# Patient Record
Sex: Female | Born: 1950
Health system: Southern US, Community
[De-identification: ages and names within clinical notes are randomized; demographics above are authoritative.]

## PROBLEM LIST (undated history)

## (undated) DIAGNOSIS — I255 Ischemic cardiomyopathy: Secondary | ICD-10-CM

## (undated) DIAGNOSIS — E119 Type 2 diabetes mellitus without complications: Secondary | ICD-10-CM

## (undated) DIAGNOSIS — I2102 ST elevation (STEMI) myocardial infarction involving left anterior descending coronary artery: Secondary | ICD-10-CM

## (undated) DIAGNOSIS — I472 Ventricular tachycardia, unspecified: Secondary | ICD-10-CM

## (undated) DIAGNOSIS — E785 Hyperlipidemia, unspecified: Secondary | ICD-10-CM

## (undated) DIAGNOSIS — M1990 Unspecified osteoarthritis, unspecified site: Secondary | ICD-10-CM

## (undated) DIAGNOSIS — I10 Essential (primary) hypertension: Secondary | ICD-10-CM

## (undated) DIAGNOSIS — F32A Depression, unspecified: Secondary | ICD-10-CM

## (undated) DIAGNOSIS — T7840XA Allergy, unspecified, initial encounter: Secondary | ICD-10-CM

## (undated) DIAGNOSIS — I5020 Unspecified systolic (congestive) heart failure: Secondary | ICD-10-CM

## (undated) DIAGNOSIS — I480 Paroxysmal atrial fibrillation: Secondary | ICD-10-CM

## (undated) DIAGNOSIS — I2510 Atherosclerotic heart disease of native coronary artery without angina pectoris: Secondary | ICD-10-CM

## (undated) HISTORY — DX: Unspecified systolic (congestive) heart failure: I50.20

## (undated) HISTORY — DX: Atherosclerotic heart disease of native coronary artery without angina pectoris: I25.10

## (undated) HISTORY — DX: Depression, unspecified: F32.A

## (undated) HISTORY — PX: HERNIA REPAIR: SHX51

## (undated) HISTORY — DX: Allergy, unspecified, initial encounter: T78.40XA

## (undated) HISTORY — PX: APPENDECTOMY: SHX54

## (undated) HISTORY — DX: Unspecified osteoarthritis, unspecified site: M19.90

## (undated) HISTORY — PX: CHOLECYSTECTOMY OPEN: SUR202

## (undated) HISTORY — DX: ST elevation (STEMI) myocardial infarction involving left anterior descending coronary artery: I21.02

## (undated) HISTORY — DX: Ischemic cardiomyopathy: I25.5

## (undated) HISTORY — PX: CARDIAC CATHETERIZATION: SHX172

---

## 2012-05-22 ENCOUNTER — Encounter (HOSPITAL_COMMUNITY): Payer: Self-pay | Admitting: *Deleted

## 2012-05-22 ENCOUNTER — Emergency Department (HOSPITAL_COMMUNITY): Payer: Self-pay

## 2012-05-22 DIAGNOSIS — I10 Essential (primary) hypertension: Secondary | ICD-10-CM | POA: Insufficient documentation

## 2012-05-22 DIAGNOSIS — R079 Chest pain, unspecified: Principal | ICD-10-CM | POA: Insufficient documentation

## 2012-05-22 DIAGNOSIS — R61 Generalized hyperhidrosis: Secondary | ICD-10-CM | POA: Insufficient documentation

## 2012-05-22 LAB — CBC
MCH: 28.9 pg (ref 26.0–34.0)
MCHC: 34.7 g/dL (ref 30.0–36.0)
MCV: 83.5 fL (ref 78.0–100.0)
Platelets: 251 10*3/uL (ref 150–400)
RBC: 5.15 MIL/uL — ABNORMAL HIGH (ref 3.87–5.11)
RDW: 13.7 % (ref 11.5–15.5)

## 2012-05-22 LAB — COMPREHENSIVE METABOLIC PANEL
AST: 38 U/L — ABNORMAL HIGH (ref 0–37)
CO2: 26 mEq/L (ref 19–32)
Calcium: 9.4 mg/dL (ref 8.4–10.5)
Creatinine, Ser: 0.74 mg/dL (ref 0.50–1.10)
GFR calc non Af Amer: 90 mL/min — ABNORMAL LOW (ref >90)

## 2012-05-22 LAB — CK TOTAL AND CKMB (NOT AT ARMC): Total CK: 79 U/L (ref 7–177)

## 2012-05-22 LAB — POCT I-STAT TROPONIN I

## 2012-05-22 NOTE — ED Notes (Signed)
Pt reports pain started as sharp pains to back intermittent, then began today with left side rib pain and chest pain. Reports diaphoresis, denies sob. Pt very anxious at triage.

## 2012-05-23 ENCOUNTER — Observation Stay (HOSPITAL_COMMUNITY)
Admission: EM | Admit: 2012-05-23 | Discharge: 2012-05-23 | Disposition: A | Discharge status: Home / Self Care | Payer: Self-pay | Attending: Emergency Medicine | Admitting: Emergency Medicine

## 2012-05-23 DIAGNOSIS — R079 Chest pain, unspecified: Secondary | ICD-10-CM

## 2012-05-23 DIAGNOSIS — R072 Precordial pain: Secondary | ICD-10-CM

## 2012-05-23 HISTORY — DX: Essential (primary) hypertension: I10

## 2012-05-23 LAB — POCT I-STAT TROPONIN I: Troponin i, poc: 0 ng/mL (ref 0.00–0.08)

## 2012-05-23 MED ORDER — ASPIRIN 81 MG PO CHEW
324.0000 mg | CHEWABLE_TABLET | Freq: Once | ORAL | Status: AC
  Administered 2012-05-23: 324 mg via ORAL
  Filled 2012-05-23: qty 4

## 2012-05-23 NOTE — ED Notes (Signed)
Regular Diet Ordered 

## 2012-05-23 NOTE — Progress Notes (Signed)
  Echocardiogram Echocardiogram Stress Test has been performed.  Lindsey Pierce 05/23/2012, 11:10 AM

## 2012-05-23 NOTE — ED Provider Notes (Signed)
12:48 PM Pt care is assumed in the CDU, where she is on the chest pain protocol. Stress echo has been performed, pending result. No chest pain since arrival to ED, resting comfortably on stretcher. Lungs CTAB. Heart RRR. Abd s/nt/nd. Speech clear and appropriate. Extremities without edema.  Pt updated on delay. Will continue to monitor.   1:30 PM Per cardiology reading as reviewed in [facility], normal stress echo at maximum intensity. Results discussed with pt, who will be d/c home at this time. She will f/u with her PCP regarding today's ED visit.  Shaaron Adler, PA-C 05/23/12 1330

## 2012-05-23 NOTE — ED Notes (Signed)
Pt. Reports having "a twinge, like eletricity flowing in between my ribs on the left side and  Shooting across left pectoralx 1 day". No pain on rooming to ED. Denies any previous episode. Denies cardiac hxt. Reports having increased stress over the past month. (Death of mother and brother). Familial cardiac hxt, unspecified. Denies SOB. Denies N/V. Denies numbness and tingling. Denies  Headache. Not currently on any medication. A.O. X4. NAD.

## 2012-05-23 NOTE — ED Notes (Signed)
Patient is resting comfortably. 

## 2012-05-23 NOTE — ED Provider Notes (Addendum)
History     CSN: [id number]  Arrival date & time 05/22/12  0865   First MD Initiated Contact with Patient 05/23/12 0023      Chief Complaint  Patient presents with  . Chest Pain    (Consider location/radiation/quality/duration/timing/severity/associated sxs/prior treatment) Patient is a 61 y.o. female presenting with chest pain. The history is provided by the patient.  Chest Pain The chest pain began 2 days ago. Duration of episode(s) is 5 seconds. Chest pain occurs intermittently. The chest pain is worsening. Associated with: nothing. At its most intense, the pain is at 5/10. The pain is currently at 0/10. The severity of the pain is mild. The quality of the pain is described as aching and sharp. Radiates to: started in the left lower chest and felt it shoot up to the left clavicle. Exacerbated by: nothing. Pertinent negatives for primary symptoms include no syncope, no shortness of breath, no cough, no wheezing, no abdominal pain, no nausea, no vomiting and no dizziness.  Associated symptoms include diaphoresis. She tried nothing for the symptoms.  Her past medical history is significant for hyperlipidemia and hypertension.  Pertinent negatives for past medical history include no MI and no PE.  Pertinent negatives for family medical history include: no CAD in family and no early MI in family.  Procedure history is negative for cardiac catheterization and echocardiogram.     Past Medical History  Diagnosis Date  . Hypertension     History reviewed. No pertinent past surgical history.  History reviewed. No pertinent family history.  History  Substance Use Topics  . Smoking status: Not on file  . Smokeless tobacco: Not on file  . Alcohol Use: No    OB History    Grav Para Term Preterm Abortions TAB SAB Ect Mult Living                  Review of Systems  Constitutional: Positive for diaphoresis.  Respiratory: Negative for cough, shortness of breath and wheezing.     Cardiovascular: Positive for chest pain. Negative for syncope.  Gastrointestinal: Negative for nausea, vomiting and abdominal pain.  Neurological: Negative for dizziness.  All other systems reviewed and are negative.    Allergies  Codeine and Darvon  Home Medications   Current Outpatient Rx  Name Route Sig Dispense Refill  . ADULT MULTIVITAMIN W/MINERALS CH Oral Take 1 tablet by mouth daily.      BP 165/102  Pulse 92  Temp 98.3 F (36.8 C) (Oral)  Resp 20  SpO2 98%  Physical Exam  Nursing note and vitals reviewed. Constitutional: She is oriented to person, place, and time. She appears well-developed and well-nourished. No distress.       Tearful on exam.  States she is scared  HENT:  Head: Normocephalic and atraumatic.  Mouth/Throat: Oropharynx is clear and moist.  Eyes: Conjunctivae and EOM are normal. Pupils are equal, round, and reactive to light.  Neck: Normal range of motion. Neck supple.  Cardiovascular: Normal rate, regular rhythm and intact distal pulses.   No murmur heard. Pulmonary/Chest: Effort normal and breath sounds normal. No respiratory distress. She has no wheezes. She has no rales.  Abdominal: Soft. She exhibits no distension. There is no tenderness. There is no rebound and no guarding.  Musculoskeletal: Normal range of motion. She exhibits no edema and no tenderness.  Neurological: She is alert and oriented to person, place, and time.  Skin: Skin is warm and dry. No rash noted. No erythema.  Psychiatric: She has a normal mood and affect. Her behavior is normal.    ED Course  Procedures (including critical care time)  Labs Reviewed  CBC - Abnormal; Notable for the following:    RBC 5.15 (*)     All other components within normal limits  COMPREHENSIVE METABOLIC PANEL - Abnormal; Notable for the following:    Glucose, Bld 107 (*)     AST 38 (*)     ALT 51 (*)     GFR calc non Af Amer 90 (*)     All other components within normal limits  CK  TOTAL AND CKMB  POCT I-STAT TROPONIN I   Dg Chest 2 View  05/22/2012  *RADIOLOGY REPORT*  Clinical Data: Left-sided chest pain for 2 days.  CHEST - 2 VIEW  Comparison: None.  Findings: There is borderline cardiomegaly.  Pulmonary vascularity is normal and the lungs are clear.  No pneumothorax.  No pleural effusions.  No acute osseous abnormality.  IMPRESSION: Borderline cardiomegaly.  Otherwise no significant abnormality.  Original Report Authenticated By: Gwynn Burly, M.D.    Date: 05/23/2012  Rate: 90  Rhythm: normal sinus rhythm  QRS Axis: left  Intervals: normal  ST/T Wave abnormalities: normal  Conduction Disutrbances:none  Narrative Interpretation:   Old EKG Reviewed: none available    No diagnosis found.    MDM   Patient with atypical chest pain that starts in the left side of her chest that's been intermittent for the last 2 days and today and admitted to the lower clavicle and seconds. She denies any cough but did have diaphoresis. For shortness of breath or infectious symptoms.  Appearing here and pain-free. Patient takes no medications but has a history of hypertension and hyperlipidemia. She states these improved when she lost 85 pounds. No family history of heart disease. This makes her a TIMI 1 for recurrent episodes in the last 24 hours. Patient's initial troponin was 0.07 the rest of her labs were within normal limits. EKG with some mild left axis deviation otherwise within normal limits and her chest x-ray showed borderline cardiomegaly. On exam patient is asymptomatic at this time. Repeat troponin ordered this would make it 12 hours after her first episode of pain today. If this troponin is normal place her in the chest pain obs protocol.  12:57 AM Repeat troponin was neg.  Will place in Chest pain obs.        Gwyneth Sprout, MD 05/23/12 0040  Gwyneth Sprout, MD 05/23/12 [id number]  Gwyneth Sprout, MD 05/23/12 1308

## 2012-05-24 NOTE — ED Provider Notes (Signed)
Medical screening examination/treatment/procedure(s) were conducted as a shared visit with non-physician practitioner(s) and myself.  I personally evaluated the patient during the encounter   Gwyneth Sprout, MD 05/24/12 [phone]

## 2018-05-31 ENCOUNTER — Inpatient Hospital Stay (HOSPITAL_COMMUNITY)
Admission: EM | Admit: 2018-05-31 | Discharge: 2018-06-06 | DRG: 270 | Disposition: A | Discharge status: Home / Self Care | Payer: Medicare Other | Attending: Internal Medicine | Admitting: Internal Medicine

## 2018-05-31 ENCOUNTER — Emergency Department (HOSPITAL_COMMUNITY): Admit: 2018-05-31 | Payer: Self-pay | Admitting: Internal Medicine

## 2018-05-31 ENCOUNTER — Inpatient Hospital Stay (HOSPITAL_COMMUNITY): Payer: Medicare Other

## 2018-05-31 ENCOUNTER — Inpatient Hospital Stay (HOSPITAL_COMMUNITY)
Admission: EM | Disposition: A | Discharge status: Home / Self Care | Payer: Self-pay | Source: Home / Self Care | Attending: Internal Medicine

## 2018-05-31 DIAGNOSIS — I213 ST elevation (STEMI) myocardial infarction of unspecified site: Secondary | ICD-10-CM | POA: Diagnosis not present

## 2018-05-31 DIAGNOSIS — E876 Hypokalemia: Secondary | ICD-10-CM | POA: Diagnosis not present

## 2018-05-31 DIAGNOSIS — R570 Cardiogenic shock: Secondary | ICD-10-CM

## 2018-05-31 DIAGNOSIS — I5021 Acute systolic (congestive) heart failure: Secondary | ICD-10-CM | POA: Diagnosis not present

## 2018-05-31 DIAGNOSIS — R197 Diarrhea, unspecified: Secondary | ICD-10-CM | POA: Diagnosis not present

## 2018-05-31 DIAGNOSIS — Z885 Allergy status to narcotic agent status: Secondary | ICD-10-CM

## 2018-05-31 DIAGNOSIS — I472 Ventricular tachycardia: Secondary | ICD-10-CM | POA: Diagnosis not present

## 2018-05-31 DIAGNOSIS — Z95811 Presence of heart assist device: Secondary | ICD-10-CM | POA: Diagnosis not present

## 2018-05-31 DIAGNOSIS — I255 Ischemic cardiomyopathy: Secondary | ICD-10-CM | POA: Diagnosis present

## 2018-05-31 DIAGNOSIS — E785 Hyperlipidemia, unspecified: Secondary | ICD-10-CM

## 2018-05-31 DIAGNOSIS — I10 Essential (primary) hypertension: Secondary | ICD-10-CM

## 2018-05-31 DIAGNOSIS — I4891 Unspecified atrial fibrillation: Secondary | ICD-10-CM | POA: Diagnosis present

## 2018-05-31 DIAGNOSIS — R112 Nausea with vomiting, unspecified: Secondary | ICD-10-CM | POA: Diagnosis not present

## 2018-05-31 DIAGNOSIS — I340 Nonrheumatic mitral (valve) insufficiency: Secondary | ICD-10-CM | POA: Diagnosis not present

## 2018-05-31 DIAGNOSIS — I110 Hypertensive heart disease with heart failure: Secondary | ICD-10-CM | POA: Diagnosis not present

## 2018-05-31 DIAGNOSIS — I2109 ST elevation (STEMI) myocardial infarction involving other coronary artery of anterior wall: Secondary | ICD-10-CM | POA: Diagnosis not present

## 2018-05-31 DIAGNOSIS — R0902 Hypoxemia: Secondary | ICD-10-CM | POA: Diagnosis not present

## 2018-05-31 DIAGNOSIS — Z6841 Body Mass Index (BMI) 40.0 and over, adult: Secondary | ICD-10-CM | POA: Diagnosis not present

## 2018-05-31 DIAGNOSIS — I2102 ST elevation (STEMI) myocardial infarction involving left anterior descending coronary artery: Secondary | ICD-10-CM | POA: Diagnosis present

## 2018-05-31 DIAGNOSIS — Z9889 Other specified postprocedural states: Secondary | ICD-10-CM

## 2018-05-31 DIAGNOSIS — I2510 Atherosclerotic heart disease of native coronary artery without angina pectoris: Secondary | ICD-10-CM | POA: Diagnosis not present

## 2018-05-31 DIAGNOSIS — Z955 Presence of coronary angioplasty implant and graft: Secondary | ICD-10-CM

## 2018-05-31 DIAGNOSIS — R0789 Other chest pain: Secondary | ICD-10-CM | POA: Diagnosis not present

## 2018-05-31 DIAGNOSIS — R079 Chest pain, unspecified: Secondary | ICD-10-CM | POA: Diagnosis not present

## 2018-05-31 HISTORY — DX: Hyperlipidemia, unspecified: E78.5

## 2018-05-31 HISTORY — PX: LEFT HEART CATH AND CORONARY ANGIOGRAPHY: CATH118249

## 2018-05-31 HISTORY — DX: Paroxysmal atrial fibrillation: I48.0

## 2018-05-31 HISTORY — PX: CORONARY/GRAFT ACUTE MI REVASCULARIZATION: CATH118305

## 2018-05-31 HISTORY — DX: Type 2 diabetes mellitus without complications: E11.9

## 2018-05-31 HISTORY — DX: Ventricular tachycardia, unspecified: I47.20

## 2018-05-31 HISTORY — DX: Ventricular tachycardia: I47.2

## 2018-05-31 HISTORY — PX: IABP INSERTION: CATH118242

## 2018-05-31 LAB — COMPREHENSIVE METABOLIC PANEL
ALBUMIN: 3.9 g/dL (ref 3.5–5.0)
ALT: 38 U/L (ref 0–44)
ANION GAP: 11 (ref 5–15)
AST: 34 U/L (ref 15–41)
Alkaline Phosphatase: 69 U/L (ref 38–126)
BUN: 17 mg/dL (ref 8–23)
CHLORIDE: 106 mmol/L (ref 98–111)
CO2: 23 mmol/L (ref 22–32)
Calcium: 8.7 mg/dL — ABNORMAL LOW (ref 8.9–10.3)
Creatinine, Ser: 0.99 mg/dL (ref 0.44–1.00)
GFR calc Af Amer: >60 mL/min (ref >60)
GFR calc non Af Amer: 58 mL/min — ABNORMAL LOW (ref >60)
GLUCOSE: 234 mg/dL — AB (ref 70–99)
POTASSIUM: 3.5 mmol/L (ref 3.5–5.1)
Sodium: 140 mmol/L (ref 135–145)
Total Bilirubin: 0.6 mg/dL (ref 0.3–1.2)
Total Protein: 6.8 g/dL (ref 6.5–8.1)

## 2018-05-31 LAB — CBC
HEMATOCRIT: 41.8 % (ref 36.0–46.0)
Hemoglobin: 14.1 g/dL (ref 12.0–15.0)
MCH: 28.8 pg (ref 26.0–34.0)
MCHC: 33.7 g/dL (ref 30.0–36.0)
MCV: 85.5 fL (ref 78.0–100.0)
Platelets: 269 10*3/uL (ref 150–400)
RBC: 4.89 MIL/uL (ref 3.87–5.11)
RDW: 13.2 % (ref 11.5–15.5)
WBC: 7.2 10*3/uL (ref 4.0–10.5)

## 2018-05-31 LAB — PROTIME-INR
INR: 1.2
Prothrombin Time: 15.1 seconds (ref 11.4–15.2)

## 2018-05-31 LAB — HEMOGLOBIN A1C
Hgb A1c MFr Bld: 7.3 % — ABNORMAL HIGH (ref 4.8–5.6)
MEAN PLASMA GLUCOSE: 162.81 mg/dL

## 2018-05-31 LAB — APTT: APTT: 28 s (ref 24–36)

## 2018-05-31 SURGERY — CORONARY/GRAFT ACUTE MI REVASCULARIZATION
Anesthesia: LOCAL

## 2018-05-31 MED ORDER — HEPARIN (PORCINE) IN NACL 1000-0.9 UT/500ML-% IV SOLN
INTRAVENOUS | Status: AC
  Filled 2018-05-31: qty 1000

## 2018-05-31 MED ORDER — ASPIRIN 81 MG PO CHEW
81.0000 mg | CHEWABLE_TABLET | Freq: Every day | ORAL | Status: DC
  Administered 2018-06-01 – 2018-06-06 (×6): 81 mg via ORAL
  Filled 2018-05-31 (×6): qty 1

## 2018-05-31 MED ORDER — DEXTROSE 5 % IV SOLN
INTRAVENOUS | Status: DC | PRN
  Administered 2018-05-31: 5 ug/min via INTRAVENOUS

## 2018-05-31 MED ORDER — HEPARIN (PORCINE) IN NACL 1000-0.9 UT/500ML-% IV SOLN
INTRAVENOUS | Status: DC | PRN
  Administered 2018-05-31 (×3): 500 mL

## 2018-05-31 MED ORDER — AMIODARONE HCL IN DEXTROSE 360-4.14 MG/200ML-% IV SOLN
INTRAVENOUS | Status: DC | PRN
  Administered 2018-05-31: 60 mg/h via INTRAVENOUS

## 2018-05-31 MED ORDER — NITROGLYCERIN 1 MG/10 ML FOR IR/CATH LAB
INTRA_ARTERIAL | Status: AC
  Filled 2018-05-31: qty 10

## 2018-05-31 MED ORDER — AMIODARONE LOAD VIA INFUSION
INTRAVENOUS | Status: DC | PRN
  Administered 2018-05-31: 150 mg via INTRAVENOUS

## 2018-05-31 MED ORDER — SODIUM CHLORIDE 0.9% FLUSH: 3.0000 mL | INTRAVENOUS | Status: DC | PRN

## 2018-05-31 MED ORDER — AMIODARONE HCL 150 MG/3ML IV SOLN
INTRAVENOUS | Status: AC
  Filled 2018-05-31: qty 3

## 2018-05-31 MED ORDER — HEPARIN SODIUM (PORCINE) 1000 UNIT/ML IJ SOLN
INTRAMUSCULAR | Status: AC
  Filled 2018-05-31: qty 1

## 2018-05-31 MED ORDER — INSULIN ASPART 100 UNIT/ML ~~LOC~~ SOLN
0.0000 [IU] | Freq: Three times a day (TID) | SUBCUTANEOUS | Status: DC
  Administered 2018-06-01 – 2018-06-02 (×5): 3 [IU] via SUBCUTANEOUS
  Administered 2018-06-02 – 2018-06-03 (×2): 2 [IU] via SUBCUTANEOUS
  Administered 2018-06-03: 3 [IU] via SUBCUTANEOUS
  Administered 2018-06-04 (×2): 2 [IU] via SUBCUTANEOUS
  Administered 2018-06-05: 3 [IU] via SUBCUTANEOUS
  Administered 2018-06-05 – 2018-06-06 (×2): 2 [IU] via SUBCUTANEOUS

## 2018-05-31 MED ORDER — FENTANYL CITRATE (PF) 100 MCG/2ML IJ SOLN
INTRAMUSCULAR | Status: DC | PRN
  Administered 2018-05-31: 25 ug via INTRAVENOUS

## 2018-05-31 MED ORDER — MIDAZOLAM HCL 2 MG/2ML IJ SOLN
INTRAMUSCULAR | Status: AC
  Filled 2018-05-31: qty 2

## 2018-05-31 MED ORDER — NOREPINEPHRINE 4 MG/250ML-% IV SOLN
0.0000 ug/min | INTRAVENOUS | Status: DC
Start: 2018-05-31 — End: 2018-06-04
  Administered 2018-06-01: 5 ug/min via INTRAVENOUS

## 2018-05-31 MED ORDER — FENTANYL CITRATE (PF) 100 MCG/2ML IJ SOLN
INTRAMUSCULAR | Status: AC
  Filled 2018-05-31: qty 2

## 2018-05-31 MED ORDER — DEXTROSE 5 % IV SOLN
INTRAVENOUS | Status: AC | PRN
  Administered 2018-05-31: 5 ug/min via INTRAVENOUS

## 2018-05-31 MED ORDER — SODIUM CHLORIDE 0.9 % IV SOLN: 250.0000 mL | INTRAVENOUS | Status: DC | PRN

## 2018-05-31 MED ORDER — HEPARIN (PORCINE) IN NACL 1000-0.9 UT/500ML-% IV SOLN
INTRAVENOUS | Status: AC
  Filled 2018-05-31: qty 500

## 2018-05-31 MED ORDER — SODIUM CHLORIDE 0.9% FLUSH
3.0000 mL | Freq: Two times a day (BID) | INTRAVENOUS | Status: DC
  Administered 2018-06-01: 3 mL via INTRAVENOUS
  Administered 2018-06-01: 10 mL via INTRAVENOUS
  Administered 2018-06-02 – 2018-06-06 (×9): 3 mL via INTRAVENOUS

## 2018-05-31 MED ORDER — SODIUM CHLORIDE 0.9 % IV SOLN
INTRAVENOUS | Status: AC | PRN
  Administered 2018-05-31: 4 ug/kg/min via INTRAVENOUS

## 2018-05-31 MED ORDER — AMIODARONE HCL IN DEXTROSE 360-4.14 MG/200ML-% IV SOLN
60.0000 mg/h | INTRAVENOUS | Status: AC
  Administered 2018-06-01: 60 mg/h via INTRAVENOUS
  Filled 2018-05-31: qty 200

## 2018-05-31 MED ORDER — ONDANSETRON HCL 4 MG/2ML IJ SOLN
4.0000 mg | Freq: Four times a day (QID) | INTRAMUSCULAR | Status: DC | PRN
  Administered 2018-06-05: 4 mg via INTRAVENOUS
  Filled 2018-05-31 (×2): qty 2

## 2018-05-31 MED ORDER — LABETALOL HCL 5 MG/ML IV SOLN: 10.0000 mg | INTRAVENOUS | Status: AC | PRN

## 2018-05-31 MED ORDER — CANGRELOR TETRASODIUM 50 MG IV SOLR
INTRAVENOUS | Status: AC
  Filled 2018-05-31: qty 50

## 2018-05-31 MED ORDER — AMIODARONE HCL IN DEXTROSE 360-4.14 MG/200ML-% IV SOLN
INTRAVENOUS | Status: AC
  Filled 2018-05-31: qty 200

## 2018-05-31 MED ORDER — TICAGRELOR 90 MG PO TABS
90.0000 mg | ORAL_TABLET | Freq: Two times a day (BID) | ORAL | Status: DC
  Administered 2018-06-01 – 2018-06-06 (×11): 90 mg via ORAL
  Filled 2018-05-31 (×11): qty 1

## 2018-05-31 MED ORDER — HEPARIN SODIUM (PORCINE) 1000 UNIT/ML IJ SOLN
INTRAMUSCULAR | Status: DC | PRN
  Administered 2018-05-31: 5000 [IU] via INTRAVENOUS

## 2018-05-31 MED ORDER — VERAPAMIL HCL 2.5 MG/ML IV SOLN
INTRAVENOUS | Status: DC | PRN
  Administered 2018-05-31: 10 mL via INTRA_ARTERIAL

## 2018-05-31 MED ORDER — MIDAZOLAM HCL 2 MG/2ML IJ SOLN
INTRAMUSCULAR | Status: DC | PRN
  Administered 2018-05-31: 1 mg via INTRAVENOUS

## 2018-05-31 MED ORDER — LIDOCAINE HCL (PF) 1 % IJ SOLN
INTRAMUSCULAR | Status: AC
  Filled 2018-05-31: qty 30

## 2018-05-31 MED ORDER — AMIODARONE HCL IN DEXTROSE 360-4.14 MG/200ML-% IV SOLN
30.0000 mg/h | INTRAVENOUS | Status: DC
  Administered 2018-06-01 (×3): 30 mg/h via INTRAVENOUS
  Filled 2018-05-31 (×2): qty 200

## 2018-05-31 MED ORDER — HEPARIN SODIUM (PORCINE) 1000 UNIT/ML IJ SOLN
INTRAMUSCULAR | Status: DC | PRN
  Administered 2018-05-31: 8000 [IU] via INTRAVENOUS
  Administered 2018-05-31: 3000 [IU] via INTRAVENOUS

## 2018-05-31 MED ORDER — SODIUM CHLORIDE 0.9 % IV SOLN
4.0000 ug/kg/min | INTRAVENOUS | Status: AC
  Administered 2018-06-01: 4 ug/kg/min via INTRAVENOUS
  Filled 2018-05-31: qty 50

## 2018-05-31 MED ORDER — VERAPAMIL HCL 2.5 MG/ML IV SOLN
INTRAVENOUS | Status: AC
  Filled 2018-05-31: qty 2

## 2018-05-31 MED ORDER — SODIUM CHLORIDE 0.9 % IV SOLN
INTRAVENOUS | Status: DC | PRN
  Administered 2018-05-31: 999 mL/h via INTRAVENOUS

## 2018-05-31 MED ORDER — FUROSEMIDE 10 MG/ML IJ SOLN
40.0000 mg | Freq: Once | INTRAMUSCULAR | Status: AC
  Administered 2018-06-01: 40 mg via INTRAVENOUS
  Filled 2018-05-31: qty 4

## 2018-05-31 MED ORDER — ACETAMINOPHEN 325 MG PO TABS
650.0000 mg | ORAL_TABLET | ORAL | Status: DC | PRN
  Administered 2018-06-01 – 2018-06-02 (×3): 650 mg via ORAL
  Filled 2018-05-31 (×4): qty 2

## 2018-05-31 MED ORDER — TICAGRELOR 90 MG PO TABS
ORAL_TABLET | ORAL | Status: DC | PRN
  Administered 2018-05-31: 180 mg via ORAL

## 2018-05-31 MED ORDER — CANGRELOR BOLUS VIA INFUSION
INTRAVENOUS | Status: DC | PRN
  Administered 2018-05-31: 3240 ug via INTRAVENOUS

## 2018-05-31 MED ORDER — LIDOCAINE HCL (PF) 1 % IJ SOLN
INTRAMUSCULAR | Status: DC | PRN
  Administered 2018-05-31: 2 mL

## 2018-05-31 MED ORDER — IOPAMIDOL (ISOVUE-370) INJECTION 76%
INTRAVENOUS | Status: AC
  Filled 2018-05-31: qty 125

## 2018-05-31 MED ORDER — HYDRALAZINE HCL 20 MG/ML IJ SOLN
5.0000 mg | INTRAMUSCULAR | Status: AC | PRN
  Filled 2018-05-31: qty 1

## 2018-05-31 MED ORDER — TICAGRELOR 90 MG PO TABS
ORAL_TABLET | ORAL | Status: AC
  Filled 2018-05-31: qty 2

## 2018-05-31 MED ORDER — INSULIN ASPART 100 UNIT/ML ~~LOC~~ SOLN
0.0000 [IU] | Freq: Every day | SUBCUTANEOUS | Status: DC
  Administered 2018-06-01: 3 [IU] via SUBCUTANEOUS

## 2018-05-31 MED ORDER — POTASSIUM CHLORIDE CRYS ER 20 MEQ PO TBCR
40.0000 meq | EXTENDED_RELEASE_TABLET | Freq: Once | ORAL | Status: AC
  Administered 2018-06-01: 40 meq via ORAL
  Filled 2018-05-31: qty 2

## 2018-05-31 MED ORDER — NOREPINEPHRINE 4 MG/250ML-% IV SOLN
INTRAVENOUS | Status: AC
  Filled 2018-05-31: qty 250

## 2018-05-31 SURGICAL SUPPLY — 26 items
BALLN IABP SENSA PLUS 7.5F 40C (BALLOONS) ×4
BALLN SAPPHIRE 2.0X15 (BALLOONS) ×2
BALLN SAPPHIRE ~~LOC~~ 2.75X12 (BALLOONS) ×2 IMPLANT
BALLOON IABP SENS PLUS 7.5F40C (BALLOONS) ×2 IMPLANT
BALLOON SAPPHIRE 2.0X15 (BALLOONS) ×1 IMPLANT
CATH 5FR JL3.5 JR4 ANG PIG MP (CATHETERS) ×2 IMPLANT
CATH LAUNCHER 6FR EBU3.5 (CATHETERS) ×2 IMPLANT
DEVICE RAD COMP TR BAND LRG (VASCULAR PRODUCTS) ×2 IMPLANT
GLIDESHEATH SLEND A-KIT 6F 22G (SHEATH) ×2 IMPLANT
GUIDEWIRE INQWIRE 1.5J.035X260 (WIRE) ×2 IMPLANT
INQWIRE 1.5J .035X260CM (WIRE) ×4
KIT ENCORE 26 ADVANTAGE (KITS) ×2 IMPLANT
KIT HEART LEFT (KITS) ×2 IMPLANT
KIT MICROPUNCTURE NIT STIFF (SHEATH) ×4 IMPLANT
PACK CARDIAC CATHETERIZATION (CUSTOM PROCEDURE TRAY) ×2 IMPLANT
PAD ELECT DEFIB RADIOL ZOLL (MISCELLANEOUS) ×2 IMPLANT
SHEATH PINNACLE 6F 10CM (SHEATH) ×4 IMPLANT
SHEATH PROBE COVER 6X72 (BAG) ×6 IMPLANT
STENT SYNERGY DES 2.75X16 (Permanent Stent) ×2 IMPLANT
TRANSDUCER W/STOPCOCK (MISCELLANEOUS) ×2 IMPLANT
TUBING CIL FLEX 10 FLL-RA (TUBING) ×2 IMPLANT
WIRE AMPLATZ ST .035X145CM (WIRE) ×2 IMPLANT
WIRE EMERALD 3MM-J .035X150CM (WIRE) ×2 IMPLANT
WIRE HI TORQ BMW 190CM (WIRE) ×2 IMPLANT
WIRE HI TORQ VERSACORE-J 145CM (WIRE) ×2 IMPLANT
WIRE RUNTHROUGH .014X180CM (WIRE) ×2 IMPLANT

## 2018-05-31 NOTE — Progress Notes (Signed)
Transported patient from cath lab to room 2H24 while on bipap. Transitioned patient to high flow cannula at 15lpm. Will continue to monitor patient.

## 2018-05-31 NOTE — Progress Notes (Signed)
Patient placed on bipap due to a decrease in SP02 level while on 100% NRB. Sp02 increased to 94-97% with bipap.

## 2018-05-31 NOTE — H&P (Signed)
Cardiology Admission History and Physical:   Patient ID: Lindsey Pierce; MRN: [id number]; DOB: 1951-07-11   Admission date: 05/31/2018   Chief Complaint:  Chest pain (Anterolateral STEMI, Cardiogenic shock, AFIB with RVR)  History of Present Illness:   Lindsey Pierce is a 67 y.o. female with a history of hypertension and hyperlipidemia presents with complaints of substernal chest pain that started 45 minutes prior to presentation to the hospital. She was apparently walking in a grocery store when she had the pain. There was associated diaphoresis and lightheadedness. EMS found her to be in mild distress. She was treated with ASA 324 mg and 1 dose of NTG. HR was in the 160's. Suspicious for atrial fibrillation with RVR and ST elevations (anterolateral leads). Systolic blood pressure was in the 80s.  The STEMI team was activated and she was taken urgently to the cardiac catheterization lab for STEMI and cardiogenic shock.    Past Medical History:  Diagnosis Date  . Hypertension   Hyperlipidemia  No past surgical history on file.   Medications Prior to Admission: Prior to Admission medications   Medication Sig Start Date End Date Taking? Authorizing Provider  Multiple Vitamin (MULTIVITAMIN WITH MINERALS) TABS Take 1 tablet by mouth daily.    [provider]     Allergies:    Allergies  Allergen Reactions  . Codeine     swelling  . Darvon [Propoxyphene Hcl]     swelling    Social History:   Social History   Socioeconomic History  . Marital status: Divorced    Spouse name: Not on file  . Number of children: Not on file  . Years of education: Not on file  . Highest education level: Not on file  Occupational History  . Not on file  Social Needs  . Financial resource strain: Not on file  . Food insecurity:    Worry: Not on file    Inability: Not on file  . Transportation needs:    Medical: Not on file    Non-medical: Not on file  Tobacco Use  . Smoking status:  Not on file  Substance and Sexual Activity  . Alcohol use: No  . Drug use: No  . Sexual activity: Not on file  Lifestyle  . Physical activity:    Days per week: Not on file    Minutes per session: Not on file  . Stress: Not on file  Relationships  . Social connections:    Talks on phone: Not on file    Gets together: Not on file    Attends religious service: Not on file    Active member of club or organization: Not on file    Attends meetings of clubs or organizations: Not on file    Relationship status: Not on file  . Intimate partner violence:    Fear of current or ex partner: Not on file    Emotionally abused: Not on file    Physically abused: Not on file    Forced sexual activity: Not on file  Other Topics Concern  . Not on file  Social History Narrative  . Not on file    Family History:  The patient's family history is not on file.    Review of Systems: [y] = yes, [ ]  = no   . General: Weight gain [ ] ; Weight loss [ ] ; Anorexia [ ] ; Fatigue [ ] ; Fever [ ] ; Chills [ ] ; Weakness [ ]   . Cardiac: Chest pain/pressure Blue.Reese ];  Resting SOB [ ] ; Exertional SOB [ ] ; Orthopnea [ ] ; Pedal Edema [ ] ; Palpitations [ ] ; Syncope [ ] ; Presyncope [ ] ; Paroxysmal nocturnal dyspnea[ ]   . Pulmonary: Cough [ ] ; Wheezing[ ] ; Hemoptysis[ ] ; Sputum [ ] ; Snoring [ ]   . GI: Vomiting[ ] ; Dysphagia[ ] ; Melena[ ] ; Hematochezia [ ] ; Heartburn[ ] ; Abdominal pain [ ] ; Constipation [ ] ; Diarrhea [ ] ; BRBPR [ ]   . GU: Hematuria[ ] ; Dysuria [ ] ; Nocturia[ ]   . Vascular: Pain in legs with walking [ ] ; Pain in feet with lying flat [ ] ; Non-healing sores [ ] ; Stroke [ ] ; TIA [ ] ; Slurred speech [ ] ;  . Neuro: Headaches[ ] ; Vertigo[ ] ; Seizures[ ] ; Paresthesias[ ] ;Blurred vision [ ] ; Diplopia [ ] ; Vision changes [ ]   . Ortho/Skin: Arthritis [ ] ; Joint pain [ ] ; Muscle pain [ ] ; Joint swelling [ ] ; Back Pain [ ] ; Rash [ ]   . Psych: Depression[ ] ; Anxiety[ ]   . Heme: Bleeding problems [ ] ; Clotting disorders [ ] ;  Anemia [ ]   . Endocrine: Diabetes [ ] ; Thyroid dysfunction[ ]     Physical Exam/Data:  There were no vitals filed for this visit. No intake or output data in the 24 hours ending 05/31/18 2100 There were no vitals filed for this visit. There is no height or weight on file to calculate BMI.  General:  Well nourished, well developed, diaphoretic HEENT: normal Lymph: no adenopathy Neck: unable to assess Endocrine:  No thryomegaly Vascular: thready pulses Cardiac:  Unable to assess Lungs: Unable to assess Abd: soft, nontender, no hepatomegaly  Ext: unable to assess Musculoskeletal:  No deformities,  Skin: diaphoretic Neuro:  Unable to assess    EKG:  ST elevations in the anterolateral leads and atrial fibrillation with RVR.    Laboratory Data:  ChemistryNo results for input(s): NA, K, CL, CO2, GLUCOSE, BUN, CREATININE, CALCIUM, GFRNONAA, GFRAA, ANIONGAP in the last 168 hours.  No results for input(s): PROT, ALBUMIN, AST, ALT, ALKPHOS, BILITOT in the last 168 hours. HematologyNo results for input(s): WBC, RBC, HGB, HCT, MCV, MCH, MCHC, RDW, PLT in the last 168 hours. Cardiac EnzymesNo results for input(s): TROPONINI in the last 168 hours. No results for input(s): TROPIPOC in the last 168 hours.  BNPNo results for input(s): BNP, PROBNP in the last 168 hours.  DDimer No results for input(s): DDIMER in the last 168 hours.  Radiology/Studies:  No results found.  Assessment and Plan:   1. ST Elevation myocardial infarction  Evidence of ST elevations (anterolateral) on her EMS ECG tracings. Treated with ASA 324mg  in the field. Also given 1 does of NTG.  - Transport to cardiac catheterization lab for consideration for diagnostic angiography and primary PCI - Continue ASA - Transthoracic echocardiogram to evaluate LV function - Check lipid panel. High dose statins - Continue to trend cardiac biomarkers with serial ECGs  2. Cardiogenic shock  Likely ischemic in etiology and  exacerbated with her tachyarrhythmia  - Consider LV support (with Impella/ECMO/IABP) - Pressors as needed   3. Atrial fibrillation with rapid ventricular rate  - Rate control with AV nodal blockers. - Once coronary anatomy is defined then consider rhythm management (TEE/Cardioversion) - Consider IV unfractionated heparin for now   For questions or updates, please contact [facility] Please consult [email or web address] for contact info under Cardiology/STEMI.    Signed, Meade Maw, MD  05/31/2018 8:49 PM

## 2018-06-01 ENCOUNTER — Inpatient Hospital Stay (HOSPITAL_COMMUNITY): Payer: Medicare Other

## 2018-06-01 ENCOUNTER — Encounter (HOSPITAL_COMMUNITY): Payer: Self-pay | Admitting: *Deleted

## 2018-06-01 DIAGNOSIS — I340 Nonrheumatic mitral (valve) insufficiency: Secondary | ICD-10-CM

## 2018-06-01 LAB — GLUCOSE, CAPILLARY
GLUCOSE-CAPILLARY: 185 mg/dL — AB (ref 70–99)
GLUCOSE-CAPILLARY: 255 mg/dL — AB (ref 70–99)
Glucose-Capillary: 190 mg/dL — ABNORMAL HIGH (ref 70–99)
Glucose-Capillary: 189 mg/dL — ABNORMAL HIGH (ref 70–99)
Glucose-Capillary: 181 mg/dL — ABNORMAL HIGH (ref 70–99)
Glucose-Capillary: 136 mg/dL — ABNORMAL HIGH (ref 70–99)

## 2018-06-01 LAB — CBC
HCT: 43.6 % (ref 36.0–46.0)
Hemoglobin: 14.5 g/dL (ref 12.0–15.0)
MCH: 28.8 pg (ref 26.0–34.0)
MCHC: 33.3 g/dL (ref 30.0–36.0)
MCV: 86.7 fL (ref 78.0–100.0)
Platelets: 220 10*3/uL (ref 150–400)
RBC: 5.03 MIL/uL (ref 3.87–5.11)
RDW: 13.5 % (ref 11.5–15.5)
WBC: 11.2 10*3/uL — ABNORMAL HIGH (ref 4.0–10.5)

## 2018-06-01 LAB — TROPONIN I
TROPONIN I: 0.23 ng/mL — AB (ref <0.03)
Troponin I: >65 ng/mL (ref <0.03)
Troponin I: >65 ng/mL (ref <0.03)
Troponin I: 56.85 ng/mL (ref <0.03)

## 2018-06-01 LAB — MRSA PCR SCREENING: MRSA by PCR: NEGATIVE

## 2018-06-01 LAB — LIPID PANEL
Cholesterol: 198 mg/dL (ref 0–200)
HDL: 30 mg/dL — ABNORMAL LOW (ref >40)
LDL Cholesterol: 134 mg/dL — ABNORMAL HIGH (ref 0–99)
Total CHOL/HDL Ratio: 6.6 RATIO
Triglycerides: 170 mg/dL — ABNORMAL HIGH (ref <150)
VLDL: 34 mg/dL (ref 0–40)

## 2018-06-01 LAB — BASIC METABOLIC PANEL
ANION GAP: 11 (ref 5–15)
BUN: 16 mg/dL (ref 8–23)
CALCIUM: 8.5 mg/dL — AB (ref 8.9–10.3)
CO2: 26 mmol/L (ref 22–32)
Chloride: 106 mmol/L (ref 98–111)
Creatinine, Ser: 0.97 mg/dL (ref 0.44–1.00)
GFR calc Af Amer: >60 mL/min (ref >60)
GFR, EST NON AFRICAN AMERICAN: 59 mL/min — AB (ref >60)
GLUCOSE: 195 mg/dL — AB (ref 70–99)
Potassium: 3.7 mmol/L (ref 3.5–5.1)
Sodium: 143 mmol/L (ref 135–145)

## 2018-06-01 LAB — ECHOCARDIOGRAM COMPLETE
HEIGHTINCHES: 61 in
WEIGHTICAEL: 3954.1706 [oz_av]

## 2018-06-01 LAB — POCT ACTIVATED CLOTTING TIME: ACTIVATED CLOTTING TIME: 120 s

## 2018-06-01 LAB — MAGNESIUM: Magnesium: 1.9 mg/dL (ref 1.7–2.4)

## 2018-06-01 MED ORDER — SALINE SPRAY 0.65 % NA SOLN
1.0000 | NASAL | Status: DC | PRN
  Administered 2018-06-01: 1 via NASAL
  Filled 2018-06-01: qty 44

## 2018-06-01 MED ORDER — MELATONIN 3 MG PO TABS
6.0000 mg | ORAL_TABLET | Freq: Every day | ORAL | Status: DC
  Administered 2018-06-01: 6 mg via ORAL
  Filled 2018-06-01 (×5): qty 2

## 2018-06-01 MED ORDER — FENTANYL CITRATE (PF) 100 MCG/2ML IJ SOLN
25.0000 ug | Freq: Once | INTRAMUSCULAR | Status: AC
  Administered 2018-06-01: 25 ug via INTRAVENOUS
  Filled 2018-06-01: qty 2

## 2018-06-01 MED ORDER — ORAL CARE MOUTH RINSE
15.0000 mL | Freq: Two times a day (BID) | OROMUCOSAL | Status: DC
  Administered 2018-06-02: 15 mL via OROMUCOSAL

## 2018-06-01 MED ORDER — FUROSEMIDE 10 MG/ML IJ SOLN
INTRAMUSCULAR | Status: AC
  Filled 2018-06-01: qty 4

## 2018-06-01 MED ORDER — MELATONIN 3 MG PO TABS
3.0000 mg | ORAL_TABLET | Freq: Every day | ORAL | Status: DC
  Filled 2018-06-01: qty 1

## 2018-06-01 MED ORDER — HEPARIN (PORCINE) IN NACL 100-0.45 UNIT/ML-% IJ SOLN
850.0000 [IU]/h | INTRAMUSCULAR | Status: DC
  Administered 2018-06-01: 850 [IU]/h via INTRAVENOUS
  Filled 2018-06-01: qty 250

## 2018-06-01 MED ORDER — ATORVASTATIN CALCIUM 80 MG PO TABS
80.0000 mg | ORAL_TABLET | Freq: Every day | ORAL | Status: DC
  Administered 2018-06-01 – 2018-06-05 (×5): 80 mg via ORAL
  Filled 2018-06-01 (×5): qty 1

## 2018-06-01 NOTE — Progress Notes (Signed)
ANTICOAGULATION CONSULT NOTE - Initial Consult  Pharmacy Consult for Heparin  Indication: IABP, s/p cath  Allergies  Allergen Reactions  . Codeine     swelling  . Darvon [Propoxyphene Hcl]     swelling   Vital Signs: Temp: 98.8 F (37.1 C) (07/20 0354) Temp Source: Oral (07/20 0354) BP: 129/72 (07/20 0300) Pulse Rate: 126 (07/19 2254)  Labs: Recent Labs    05/31/18 2108  HGB 14.1  HCT 41.8  PLT 269  APTT 28  LABPROT 15.1  INR 1.20  CREATININE 0.99  TROPONINI 0.23*    Estimated Creatinine Clearance: 62.6 mL/min (by C-G formula based on SCr of 0.99 mg/dL).   Medical History: Past Medical History:  Diagnosis Date  . Hypertension    Assessment: 67 y/o F s/p cath with successful PCI to the mid LAD/D2, pt with cardiogenic shock requiring placement of IABP, starting heparin 2 hours post-TR removal which was done at 0230. Baseline renal function and CBC good.   Goal of Therapy:  Heparin level 0.2-0.5 units/ml Monitor platelets by anticoagulation protocol: Yes   Plan:  Start heparin at 850 units/hr at 0430 1130 HL Daily CB/HL Monitor for bleeding  Narda Bonds 06/01/2018,4:02 AM

## 2018-06-01 NOTE — Progress Notes (Signed)
IABP removed by Cath lab personnel Harriet.  Patient given 25 mcg fentanyl for pain at site. Site is level 1 with bruising. DP pulse palpable.  Cap refill <3.  Patient is resting comfortably.  6 HOUR BEDREST STARTS AT 1500 AND WILL BE UP AT 2100.     Per protocol foley will be removed at the time bedrest is up and patient can begin to mobilize. RN will continue to monitor patient.   Bo Merino, RN

## 2018-06-01 NOTE — Progress Notes (Signed)
Per cardiology MD at bedside wean IABP 1:2 for now. RN will continue to monitor closely.

## 2018-06-01 NOTE — Progress Notes (Addendum)
Progress Note  Patient Name: Lindsey Pierce Date of Encounter: 06/01/2018  Primary Cardiologist: No primary care provider on file.   Subjective   "I feel better today", denies chest pain or shortness of breath.  Inpatient Medications    Scheduled Meds: . aspirin  81 mg Oral Daily  . atorvastatin  80 mg Oral q1800  . insulin aspart  0-15 Units Subcutaneous TID WC  . insulin aspart  0-5 Units Subcutaneous QHS  . mouth rinse  15 mL Mouth Rinse BID  . sodium chloride flush  3 mL Intravenous Q12H  . ticagrelor  90 mg Oral BID   Continuous Infusions: . sodium chloride 10 mL/hr at 06/01/18 1000  . amiodarone 30 mg/hr (06/01/18 1000)  . heparin 850 Units/hr (06/01/18 1000)  . norepinephrine (LEVOPHED) Adult infusion Stopped (06/01/18 0014)   PRN Meds: sodium chloride, acetaminophen, ondansetron (ZOFRAN) IV, sodium chloride flush   Vital Signs    Vitals:   06/01/18 0800 06/01/18 0823 06/01/18 0900 06/01/18 1000  BP: (!) 130/102     Pulse:      Resp: 19  (!) 21 17  Temp:  98.3 F (36.8 C)    TempSrc:  Oral    SpO2: 97%  97% 98%  Weight:      Height:        Intake/Output Summary (Last 24 hours) at 06/01/2018 1018 Last data filed at 06/01/2018 1000 Gross per 24 hour  Intake 442.28 ml  Output 1150 ml  Net -707.72 ml   Filed Weights   05/31/18 2345  Weight: 247 lb 2.2 oz (112.1 kg)    Telemetry    Normal sinus rhythm- Personally Reviewed  ECG    Normal sinus rhythm with inferior and anterior MI pattern- Personally Reviewed  Physical Exam   GEN: No acute distress.   Neck:  6 cm JVD Cardiac: RRR, no murmurs, rubs, or gallops.  Respiratory: Clear to auscultation bilaterally. GI: Soft, obese, nontender, non-distended  MS: No edema; No deformity.  Aortic balloon pump remains in place Neuro:  Nonfocal  Psych: Normal affect   Labs    Chemistry Recent Labs  Lab 05/31/18 2108 06/01/18 0402  NA 140 143  K 3.5 3.7  CL 106 106  CO2 23 26  GLUCOSE 234* 195*   BUN 17 16  CREATININE 0.99 0.97  CALCIUM 8.7* 8.5*  PROT 6.8  --   ALBUMIN 3.9  --   AST 34  --   ALT 38  --   ALKPHOS 69  --   BILITOT 0.6  --   GFRNONAA 58* 59*  GFRAA >60 >60  ANIONGAP 11 11     Hematology Recent Labs  Lab 05/31/18 2108 06/01/18 0402  WBC 7.2 11.2*  RBC 4.89 5.03  HGB 14.1 14.5  HCT 41.8 43.6  MCV 85.5 86.7  MCH 28.8 28.8  MCHC 33.7 33.3  RDW 13.2 13.5  PLT 269 220    Cardiac Enzymes Recent Labs  Lab 05/31/18 2108 06/01/18 0402  TROPONINI 0.23* >65.00*   No results for input(s): TROPIPOC in the last 168 hours.   BNPNo results for input(s): BNP, PROBNP in the last 168 hours.   DDimer No results for input(s): DDIMER in the last 168 hours.   Radiology    Dg Chest Port 1 View  Result Date: 06/01/2018 CLINICAL DATA:  Intra-aortic balloon pump assistance. EXAM: PORTABLE CHEST 1 VIEW COMPARISON:  Chest x-ray from yesterday. FINDINGS: Stable cardiomegaly. Unchanged intra-aortic balloon pump positioning. Normal pulmonary  vascularity. Chronic, mildly coarsened interstitial markings are similar to prior study. No focal consolidation, pleural effusion, or pneumothorax. No acute osseous abnormality. IMPRESSION: Stable cardiomegaly and positioning of the intra-aortic balloon pump. No active disease. Electronically Signed   By: Titus Dubin M.D.   On: 06/01/2018 09:19   Dg Chest Port 1 View  Result Date: 06/01/2018 CLINICAL DATA:  Intra-aortic balloon pump assist. EXAM: PORTABLE CHEST 1 VIEW COMPARISON:  05/22/2012 FINDINGS: Stable cardiomegaly. Mild aortic atherosclerosis. Metallic indicator for an intra aortic balloon pump is seen approximately 2.1 cm caudad from the top of the aortic arch. Chronic mild coarsened interstitial prominence of the lungs. No alveolar consolidation or CHF. No effusion or pneumothorax. IMPRESSION: Intra-aortic balloon pump is noted with indicator just caudad to the aortic arch as above. Stable cardiomegaly. Chronic coarsened  interstitial lung markings without alveolar consolidation. Electronically Signed   By: Ashley Royalty M.D.   On: 06/01/2018 00:00    Cardiac Studies   Left heart cath status post inferior stenting  Patient Profile     67 y.o. female admitted with acute inferior myocardial infarction complicated by shock, as well as atrial fibrillation status post percutaneous coronary intervention, and DC cardioversion  Assessment & Plan    1.  Acute inferior MI -she is doing very well status post percutaneous intervention.  Continue current medical therapy.  Her balloon pump remains in place.  We will plan to wean this from 1-1 to 2-1. 2.  Atrial fibrillation -she has no history of this and is maintaining sinus rhythm.  She is currently on amiodarone.  We will plan to wean this. 3.  Dyslipidemia -she will undergo aggressive lipid-lowering therapy.  Cristopher Peru, MD  For questions or updates, please contact [facility] Please consult [email or web address] for contact info under Cardiology/STEMI.      Signed, Cristopher Peru, MD  06/01/2018, 10:18 AM  Patient ID: Lindsey Pierce, female   DOB: 09-24-1951, 67 y.o.   MRN: [id number]

## 2018-06-01 NOTE — Plan of Care (Signed)
  Problem: Health Behavior/Discharge Planning: Goal: Ability to manage health-related needs will improve Outcome: Progressing   Problem: Clinical Measurements: Goal: Ability to maintain clinical measurements within normal limits will improve Outcome: Progressing Goal: Diagnostic test results will improve Outcome: Progressing Goal: Respiratory complications will improve Outcome: Progressing Goal: Cardiovascular complication will be avoided Outcome: Progressing   Problem: Pain Managment: Goal: General experience of comfort will improve Outcome: Progressing   Problem: Education: Goal: Understanding of cardiac disease, CV risk reduction, and recovery process will improve Outcome: Progressing   Problem: Activity: Goal: Ability to tolerate increased activity will improve Outcome: Progressing   Problem: Health Behavior/Discharge Planning: Goal: Ability to safely manage health-related needs after discharge will improve Outcome: Progressing   Problem: Education: Goal: Knowledge of General Education information will improve Description Including pain rating scale, medication(s)/side effects and non-pharmacologic comfort measures Outcome: Completed/Met   Problem: Clinical Measurements: Goal: Will remain free from infection Outcome: Completed/Met   Problem: Nutrition: Goal: Adequate nutrition will be maintained Outcome: Completed/Met   Problem: Coping: Goal: Level of anxiety will decrease Outcome: Completed/Met   Problem: Safety: Goal: Ability to remain free from injury will improve Outcome: Completed/Met   Problem: Skin Integrity: Goal: Risk for impaired skin integrity will decrease Outcome: Completed/Met   Problem: Cardiac: Goal: Ability to achieve and maintain adequate cardiopulmonary perfusion will improve Outcome: Completed/Met Goal: Vascular access site(s) Level 0-1 will be maintained Outcome: Completed/Met

## 2018-06-01 NOTE — Progress Notes (Signed)
Arrived to 2h24 for IABP removal. Explained to pt. Procedure for IABP removal, Katie,RN at bedside to assist. R dp +1 palpable prior to IABP removal. Noted  6 Fr sheath with fluids to RFV to remain intact after IAPB removal and sheath pull. RFA/ Groin site noted with level 1 hematoma prior to removal and ecchymosis area from r groin into abd. that was not fresh.V/S 78-80RSR, b/p initially diastolic 427- after Katie, RN medicated patient for pain  bp 128/71, spon2 96% on several l of 02.  IABP and sheath (7.5 fr) sheath removed from rfa site- IABP noted without clot and balloon appears to be intact. Manual pressure held to rfa site with maintaining 6 fr rfv sheath. Pt. Tolerated manual hold very well. Manual pressure held x 30 min- with hemostasis easily achieved to rfa site. V./S remained stable 83- rsr. B/p 130/82s spo2 96%, r dp+1 palp.  A small linear skin tear observed in rfa fold- most likely from manual pressure hold. Katie,RN made aware. A dry sterile dressing was applied by Katie,RN per ICU p/p. Winfred Burn viewed and examined RFA site. Site was soft after hold, no change in ecchymosis. Pt. Was left in stable condition in the care of Latta.

## 2018-06-01 NOTE — Progress Notes (Signed)
  Echocardiogram 2D Echocardiogram has been performed.  Lindsey Pierce 06/01/2018, 10:44 AM

## 2018-06-01 NOTE — Progress Notes (Signed)
Notified cardiology fellow on call about pt's runs of VT. Pt reports feeling a "flutter" when it occurs, otherwise asymptomatic.  Received orders to check magnesium with am labs.

## 2018-06-01 NOTE — Progress Notes (Addendum)
[place] [facility] Shock Patient Intake Sheet  1. Complete this form for all MI patients presenting with Cardiogenic Shock. 2. This form must be completed by a Cath Lab Super-Tech or Interventionalist. 3. Once completed please EPIC message Philemon Kingdom J   1. Inclusion criteria:  Choose all that apply:  [x]  Symptoms of acute myocardial infarction with ECG and/biomarker evidence of S-T elevation myocardial infarction or non-S-T myocardial infarction.  [x]  Systolic blood pressure < 51mmHg at baseline OR use of inotopes or vasopressors to maintain SBP >38mmHg + LVEDP >=64mmHg  [x]  Evidence of Seger Jani organ hypoperfusion  [x]  Patient undergoes PCI  2. Exclusion criteria:  Was there a reason to exclude patient from the Clinical Shock Protocol?     (if YES, check reason and rest of form will not need to be filled out)           []  Evidence of anoxic brain injury           []  Unwitnessed out of hospital cardiac arrest or any       cardiac arrest in which return of spontaneous circulation (ROSC) is not achieved in 30 minutes.           [x]  IABP placed prior to Impella           []  Patient already supported with an Impella           []  Septic, anaphylactic and hemorrhage causes of shock           []  Neurologic and Non-ischemic cause of shock/hypotension (pulmonary embolism, pneumothorax, myocarditis, tamponade, etc.)           []  Active bleeding for which mechanical circulatory   support is contraindicated           []  Recent major surgery for which mechanical circulatory support is contraindicated            []  Mechanical complications of AMI (acute ventricular septal defect (VSD) or acute papillary muscle rupture)           []  Known left ventricular thrombus for which mechanical circulatory support is contraindicated           []  Mechanical aortic prosthetic valve           []  Contraindication to intravenous systemic anticoagulation  [x]  Yes            []  No  3.  Was LVEDP  obtained before PCI and Impella?  [x]  Yes            []  No  If obtained pre PCI/Impella was it over 15 mm? [x]  Yes            []  No  4.  Was "Severe Shock" identified prior PCI? If YES, select which item was present to identify "severe shock")  [x]  SBP <42mm  []  High dose pressors  []  Shock with SBP 80-90 or low dose pressors only, but with with EF <30% in anterior STEMI or Prox LAD or Left Main  []  Shock with SBP 80-90 or only on low pressors but with EF <20% in interior or lateral MI or RCA or Circ as culpit lesion.  []  Impella placed   []  pre PCI                               []  post PCI  []  Was Right heart cath performed prior to leaving cath lab?                  []   YES        [x]  NO  [x]  Yes            []  No  5.  "Not Severe Shock" identified prior to PCI?  If NO, then patient is presumed to have had a "severe shock" and rest of questions do not need to be answered)  a. If YES was impella placed? []  Yes            [x]  No  b. If Impella was placed post PCI, was the patient still in Shock before Impella was placed? []  Yes            []  No  c. Was Right heart cath performed before Impella placement? []  Yes            [x]  No  d. If YES, what were the following values pre-Impella placement? Cardiac Index: Click or tap here to enter text. Cardiac Power: Click or tap here to enter text.  e. If Impella placed, state why.  []  Patient deteriorated into "severe shock" post PCI as defined by criteria of the [place] [facility].  []  CI or CPO Low post PCI by RCA  []  Other, (please briefly explain to the right ? If other, briefly explain here:      IABP placed in lieu of Impella due to morbid obesity and challenging vascular access.  Hemodynamics stabilized promptly with IABP placement.  [name], MD [facility] Pager: [phone]

## 2018-06-02 ENCOUNTER — Encounter (HOSPITAL_COMMUNITY): Payer: Self-pay

## 2018-06-02 LAB — BASIC METABOLIC PANEL
ANION GAP: 9 (ref 5–15)
Anion gap: 7 (ref 5–15)
BUN: 13 mg/dL (ref 8–23)
BUN: 11 mg/dL (ref 8–23)
CALCIUM: 8.2 mg/dL — AB (ref 8.9–10.3)
CHLORIDE: 105 mmol/L (ref 98–111)
CO2: 27 mmol/L (ref 22–32)
CO2: 25 mmol/L (ref 22–32)
Calcium: 8.2 mg/dL — ABNORMAL LOW (ref 8.9–10.3)
Chloride: 104 mmol/L (ref 98–111)
Creatinine, Ser: 0.79 mg/dL (ref 0.44–1.00)
Creatinine, Ser: 0.76 mg/dL (ref 0.44–1.00)
GFR calc non Af Amer: >60 mL/min (ref >60)
GLUCOSE: 180 mg/dL — AB (ref 70–99)
Glucose, Bld: 169 mg/dL — ABNORMAL HIGH (ref 70–99)
POTASSIUM: 3.7 mmol/L (ref 3.5–5.1)
Potassium: 3.2 mmol/L — ABNORMAL LOW (ref 3.5–5.1)
SODIUM: 138 mmol/L (ref 135–145)
Sodium: 139 mmol/L (ref 135–145)

## 2018-06-02 LAB — GLUCOSE, CAPILLARY
Glucose-Capillary: 177 mg/dL — ABNORMAL HIGH (ref 70–99)
Glucose-Capillary: 145 mg/dL — ABNORMAL HIGH (ref 70–99)
Glucose-Capillary: 164 mg/dL — ABNORMAL HIGH (ref 70–99)
Glucose-Capillary: 132 mg/dL — ABNORMAL HIGH (ref 70–99)

## 2018-06-02 LAB — CBC
HEMATOCRIT: 39.2 % (ref 36.0–46.0)
Hemoglobin: 12.9 g/dL (ref 12.0–15.0)
MCH: 28.8 pg (ref 26.0–34.0)
MCHC: 32.9 g/dL (ref 30.0–36.0)
MCV: 87.5 fL (ref 78.0–100.0)
Platelets: 189 10*3/uL (ref 150–400)
RBC: 4.48 MIL/uL (ref 3.87–5.11)
RDW: 13.5 % (ref 11.5–15.5)
WBC: 9.1 10*3/uL (ref 4.0–10.5)

## 2018-06-02 LAB — COOXEMETRY PANEL
CARBOXYHEMOGLOBIN: 1.3 % (ref 0.5–1.5)
Methemoglobin: 1.3 % (ref 0.0–1.5)
O2 SAT: 62.2 %
TOTAL HEMOGLOBIN: 13 g/dL (ref 12.0–16.0)

## 2018-06-02 MED ORDER — ONDANSETRON HCL 4 MG/2ML IJ SOLN
4.0000 mg | Freq: Once | INTRAMUSCULAR | Status: AC
  Administered 2018-06-02: 4 mg via INTRAVENOUS

## 2018-06-02 MED ORDER — AMIODARONE HCL IN DEXTROSE 360-4.14 MG/200ML-% IV SOLN
60.0000 mg/h | INTRAVENOUS | Status: DC
  Administered 2018-06-02 (×2): 60 mg/h via INTRAVENOUS
  Filled 2018-06-02: qty 200

## 2018-06-02 MED ORDER — AMIODARONE HCL IN DEXTROSE 360-4.14 MG/200ML-% IV SOLN
30.0000 mg/h | INTRAVENOUS | Status: DC
  Administered 2018-06-03: 30 mg/h via INTRAVENOUS
  Filled 2018-06-02: qty 200

## 2018-06-02 MED ORDER — POTASSIUM CHLORIDE CRYS ER 20 MEQ PO TBCR
40.0000 meq | EXTENDED_RELEASE_TABLET | Freq: Once | ORAL | Status: AC
  Administered 2018-06-02: 40 meq via ORAL
  Filled 2018-06-02: qty 2

## 2018-06-02 MED ORDER — ENOXAPARIN SODIUM 40 MG/0.4ML ~~LOC~~ SOLN
40.0000 mg | SUBCUTANEOUS | Status: DC
  Administered 2018-06-02 – 2018-06-05 (×4): 40 mg via SUBCUTANEOUS
  Filled 2018-06-02 (×4): qty 0.4

## 2018-06-02 NOTE — Progress Notes (Signed)
1230:  Dr. Cristopher Peru notified of pt's 20-25 beat run of VT.  Orders received.  Right groin remains WNL.  1306:  Pt noted to have sustained VT on monitor.  Pt coughed and beared down initially and came out of rhythm.   Sustained VT noted once again.  Dr. Lovena Le notified by Bonnita Nasuti via phone call about pt's change in status.  Orders received.  Pt rhythm immediately responsive to amiodarone bolus.  Further orders received.  No change in LOC was noted during above events.  Will continue to monitor pt very closely. 1313:  Pt converted to NSR.

## 2018-06-02 NOTE — Progress Notes (Signed)
Right groin venous sheath D/C'd by Coletta Memos RN.  Manual pressure applied and hemostasis obtained at 1137.  Right groin dressing with gauze and hypofix applied.   1115:  Level 0.  Right pedal pulse 3+  1130:  Level 0.  Right pedal pulse 3+.  1145:  Level 0.  Right pedal pulse 3+  1200:  Level 0:  Right pedal pulse 3+  Will continue to monitor closely.

## 2018-06-02 NOTE — Progress Notes (Signed)
  Amiodarone Drug - Drug Interaction Consult Note  Recommendations: No medication change Amiodarone is metabolized by the cytochrome P450 system and therefore has the potential to cause many drug interactions. Amiodarone has an average plasma half-life of 50 days (range 20 to 100 days).   There is potential for drug interactions to occur several weeks or months after stopping treatment and the onset of drug interactions may be slow after initiating amiodarone.   [x]  Statins: Increased risk of myopathy. Simvastatin- restrict dose to 20mg  daily. Other statins: counsel patients to report any muscle pain or weakness immediately.  Currently on atorvastatin 80mg  daily   []  Anticoagulants: Amiodarone can increase anticoagulant effect. Consider warfarin dose reduction. Patients should be monitored closely and the dose of anticoagulant altered accordingly, remembering that amiodarone levels take several weeks to stabilize.  []  Antiepileptics: Amiodarone can increase plasma concentration of phenytoin, the dose should be reduced. Note that small changes in phenytoin dose can result in large changes in levels. Monitor patient and counsel on signs of toxicity.  []  Beta blockers: increased risk of bradycardia, AV block and myocardial depression. Sotalol - avoid concomitant use.  []   Calcium channel blockers (diltiazem and verapamil): increased risk of bradycardia, AV block and myocardial depression.  []   Cyclosporine: Amiodarone increases levels of cyclosporine. Reduced dose of cyclosporine is recommended.  []  Digoxin dose should be halved when amiodarone is started.  []  Diuretics: increased risk of cardiotoxicity if hypokalemia occurs.  []  Oral hypoglycemic agents (glyburide, glipizide, glimepiride): increased risk of hypoglycemia. Patient's glucose levels should be monitored closely when initiating amiodarone therapy.   []  Drugs that prolong the QT interval:  Torsades de pointes risk may be increased  with concurrent use - avoid if possible.  Monitor QTc, also keep magnesium/potassium WNL if concurrent therapy can't be avoided. Marland Kitchen Antibiotics: e.g. fluoroquinolones, erythromycin. . Antiarrhythmics: e.g. quinidine, procainamide, disopyramide, sotalol. . Antipsychotics: e.g. phenothiazines, haloperidol.  . Lithium, tricyclic antidepressants, and methadone.   Bonnita Nasuti Pharm.D. CPP, BCPS Clinical Pharmacist [phone] 06/02/2018 1:35 PM

## 2018-06-02 NOTE — Progress Notes (Signed)
Progress Note  Patient Name: Lindsey Pierce Date of Encounter: 06/02/2018  Primary Cardiologist: No primary care provider on file.   Subjective   No chest pain or shortness of breath  Inpatient Medications    Scheduled Meds: . aspirin  81 mg Oral Daily  . atorvastatin  80 mg Oral q1800  . insulin aspart  0-15 Units Subcutaneous TID WC  . insulin aspart  0-5 Units Subcutaneous QHS  . mouth rinse  15 mL Mouth Rinse BID  . Melatonin  6 mg Oral QHS  . sodium chloride flush  3 mL Intravenous Q12H  . ticagrelor  90 mg Oral BID   Continuous Infusions: . sodium chloride 10 mL/hr at 06/02/18 0700  . amiodarone 30 mg/hr (06/02/18 0700)  . norepinephrine (LEVOPHED) Adult infusion Stopped (06/01/18 0014)   PRN Meds: sodium chloride, acetaminophen, ondansetron (ZOFRAN) IV, sodium chloride, sodium chloride flush   Vital Signs    Vitals:   06/02/18 0600 06/02/18 0700 06/02/18 0752 06/02/18 0800  BP: 123/82 122/76  (!) 141/87  Pulse:      Resp: (!) 21 18  20   Temp:   99.1 F (37.3 C)   TempSrc:   Oral   SpO2: 92% 90%  92%  Weight:      Height:        Intake/Output Summary (Last 24 hours) at 06/02/2018 0953 Last data filed at 06/02/2018 0700 Gross per 24 hour  Intake 1092.84 ml  Output 1160 ml  Net -67.16 ml   Filed Weights   05/31/18 2345  Weight: 247 lb 2.2 oz (112.1 kg)    Telemetry    Normal sinus rhythm- Personally Reviewed  ECG    Normal sinus rhythm- Personally Reviewed  Physical Exam   GEN:  Obese, no acute distress.   Neck:  7 cm JVD Cardiac: RRR, no murmurs, rubs, or gallops.  Respiratory: Clear to auscultation bilaterally. GI: Soft, obese, nontender, non-distended  MS: No edema; No deformity. Neuro:  Nonfocal  Psych: Normal affect   Labs    Chemistry Recent Labs  Lab 05/31/18 2108 06/01/18 0402 06/02/18 0402  NA 140 143 138  K 3.5 3.7 3.2*  CL 106 106 104  CO2 23 26 27   GLUCOSE 234* 195* 169*  BUN 17 16 13   CREATININE 0.99 0.97 0.79    CALCIUM 8.7* 8.5* 8.2*  PROT 6.8  --   --   ALBUMIN 3.9  --   --   AST 34  --   --   ALT 38  --   --   ALKPHOS 69  --   --   BILITOT 0.6  --   --   GFRNONAA 58* 59* >60  GFRAA >60 >60 >60  ANIONGAP 11 11 7      Hematology Recent Labs  Lab 05/31/18 2108 06/01/18 0402 06/02/18 0402  WBC 7.2 11.2* 9.1  RBC 4.89 5.03 4.48  HGB 14.1 14.5 12.9  HCT 41.8 43.6 39.2  MCV 85.5 86.7 87.5  MCH 28.8 28.8 28.8  MCHC 33.7 33.3 32.9  RDW 13.2 13.5 13.5  PLT 269 220 189    Cardiac Enzymes Recent Labs  Lab 05/31/18 2108 06/01/18 0402 06/01/18 1007 06/01/18 1700  TROPONINI 0.23* >65.00* >65.00* 56.85*   No results for input(s): TROPIPOC in the last 168 hours.   BNPNo results for input(s): BNP, PROBNP in the last 168 hours.   DDimer No results for input(s): DDIMER in the last 168 hours.   Radiology  Dg Chest Port 1 View  Result Date: 06/01/2018 CLINICAL DATA:  Intra-aortic balloon pump assistance. EXAM: PORTABLE CHEST 1 VIEW COMPARISON:  Chest x-ray from yesterday. FINDINGS: Stable cardiomegaly. Unchanged intra-aortic balloon pump positioning. Normal pulmonary vascularity. Chronic, mildly coarsened interstitial markings are similar to prior study. No focal consolidation, pleural effusion, or pneumothorax. No acute osseous abnormality. IMPRESSION: Stable cardiomegaly and positioning of the intra-aortic balloon pump. No active disease. Electronically Signed   By: Titus Dubin M.D.   On: 06/01/2018 09:19   Dg Chest Port 1 View  Result Date: 06/01/2018 CLINICAL DATA:  Intra-aortic balloon pump assist. EXAM: PORTABLE CHEST 1 VIEW COMPARISON:  05/22/2012 FINDINGS: Stable cardiomegaly. Mild aortic atherosclerosis. Metallic indicator for an intra aortic balloon pump is seen approximately 2.1 cm caudad from the top of the aortic arch. Chronic mild coarsened interstitial prominence of the lungs. No alveolar consolidation or CHF. No effusion or pneumothorax. IMPRESSION: Intra-aortic balloon  pump is noted with indicator just caudad to the aortic arch as above. Stable cardiomegaly. Chronic coarsened interstitial lung markings without alveolar consolidation. Electronically Signed   By: Ashley Royalty M.D.   On: 06/01/2018 00:00    Cardiac Studies   2D echo -EF 40 to 45%, inferior hypokinesis  Patient Profile     67 y.o. female admitted with acute myocardial infarction complicated by shock and atrial fibrillation now back in sinus rhythm, pain-free and doing well.  Assessment & Plan    1.  Inferior STEMI -the patient is doing well.  She has had no recurrent anginal symptoms.  She will continue her medical therapy. 2.  Shock -she is status post removal of her intra-aortic balloon pump.  No groin complications. 3.  Dyslipidemia -she will continue high-dose statin therapy. 4.  Obesity -weight loss will be encouraged. 5.  Atrial fibrillation -she is maintaining sinus rhythm on amiodarone.  We will discontinue her amiodarone.  Her atrial fibrillation is a consequence of her acute myocardial infarction.  I would not anticipate systemic anticoagulation secondary to the risk of bleeding with dual oral anti-platelet therapy.  For questions or updates, please contact [place] Please consult [email or web address] for contact info under Cardiology/STEMI.      Signed, Cristopher Peru, MD  06/02/2018, 9:53 AM  Patient ID: [name], female   DOB: 25-Jan-1951, 67 y.o.   MRN: [id number]

## 2018-06-03 ENCOUNTER — Other Ambulatory Visit: Payer: Self-pay

## 2018-06-03 ENCOUNTER — Encounter (HOSPITAL_COMMUNITY): Payer: Self-pay | Admitting: Internal Medicine

## 2018-06-03 DIAGNOSIS — E876 Hypokalemia: Secondary | ICD-10-CM

## 2018-06-03 DIAGNOSIS — I255 Ischemic cardiomyopathy: Secondary | ICD-10-CM

## 2018-06-03 DIAGNOSIS — I472 Ventricular tachycardia: Secondary | ICD-10-CM

## 2018-06-03 LAB — POCT ACTIVATED CLOTTING TIME
Activated Clotting Time: 246 seconds
Activated Clotting Time: 307 seconds

## 2018-06-03 LAB — CBC
HEMATOCRIT: 41 % (ref 36.0–46.0)
HEMOGLOBIN: 13.7 g/dL (ref 12.0–15.0)
MCH: 29.1 pg (ref 26.0–34.0)
MCHC: 33.4 g/dL (ref 30.0–36.0)
MCV: 87.2 fL (ref 78.0–100.0)
Platelets: 200 10*3/uL (ref 150–400)
RBC: 4.7 MIL/uL (ref 3.87–5.11)
RDW: 13.5 % (ref 11.5–15.5)
WBC: 8.3 10*3/uL (ref 4.0–10.5)

## 2018-06-03 LAB — POCT I-STAT 3, ART BLOOD GAS (G3+)
Acid-base deficit: 3 mmol/L — ABNORMAL HIGH (ref 0.0–2.0)
BICARBONATE: 23.7 mmol/L (ref 20.0–28.0)
O2 SAT: 97 %
PCO2 ART: 48.3 mmHg — AB (ref 32.0–48.0)
PH ART: 7.299 — AB (ref 7.350–7.450)
TCO2: 25 mmol/L (ref 22–32)
pO2, Arterial: 101 mmHg (ref 83.0–108.0)

## 2018-06-03 LAB — POCT I-STAT, CHEM 8
BUN: 19 mg/dL (ref 8–23)
CALCIUM ION: 1.15 mmol/L (ref 1.15–1.40)
CHLORIDE: 103 mmol/L (ref 98–111)
Creatinine, Ser: 0.9 mg/dL (ref 0.44–1.00)
GLUCOSE: 231 mg/dL — AB (ref 70–99)
HCT: 41 % (ref 36.0–46.0)
HEMOGLOBIN: 13.9 g/dL (ref 12.0–15.0)
Potassium: 3.5 mmol/L (ref 3.5–5.1)
SODIUM: 142 mmol/L (ref 135–145)
TCO2: 23 mmol/L (ref 22–32)

## 2018-06-03 LAB — BASIC METABOLIC PANEL
ANION GAP: 10 (ref 5–15)
BUN: 11 mg/dL (ref 8–23)
CALCIUM: 8.2 mg/dL — AB (ref 8.9–10.3)
CHLORIDE: 103 mmol/L (ref 98–111)
CO2: 26 mmol/L (ref 22–32)
Creatinine, Ser: 0.77 mg/dL (ref 0.44–1.00)
GFR calc non Af Amer: >60 mL/min (ref >60)
GLUCOSE: 169 mg/dL — AB (ref 70–99)
Potassium: 3.5 mmol/L (ref 3.5–5.1)
Sodium: 139 mmol/L (ref 135–145)

## 2018-06-03 LAB — GLUCOSE, CAPILLARY
GLUCOSE-CAPILLARY: 157 mg/dL — AB (ref 70–99)
GLUCOSE-CAPILLARY: 140 mg/dL — AB (ref 70–99)
GLUCOSE-CAPILLARY: 116 mg/dL — AB (ref 70–99)
Glucose-Capillary: 118 mg/dL — ABNORMAL HIGH (ref 70–99)

## 2018-06-03 LAB — MAGNESIUM: Magnesium: 2.1 mg/dL (ref 1.7–2.4)

## 2018-06-03 MED ORDER — POTASSIUM CHLORIDE CRYS ER 20 MEQ PO TBCR
40.0000 meq | EXTENDED_RELEASE_TABLET | Freq: Once | ORAL | Status: AC
  Administered 2018-06-03: 40 meq via ORAL
  Filled 2018-06-03: qty 2

## 2018-06-03 MED ORDER — AMIODARONE HCL 200 MG PO TABS
400.0000 mg | ORAL_TABLET | Freq: Two times a day (BID) | ORAL | Status: DC
  Administered 2018-06-03 – 2018-06-06 (×7): 400 mg via ORAL
  Filled 2018-06-03 (×7): qty 2

## 2018-06-03 MED ORDER — LISINOPRIL 5 MG PO TABS
5.0000 mg | ORAL_TABLET | Freq: Every day | ORAL | Status: DC
  Administered 2018-06-03 – 2018-06-06 (×4): 5 mg via ORAL
  Filled 2018-06-03 (×4): qty 1

## 2018-06-03 MED ORDER — CARVEDILOL 3.125 MG PO TABS
3.1250 mg | ORAL_TABLET | Freq: Two times a day (BID) | ORAL | Status: DC
  Administered 2018-06-03 – 2018-06-06 (×7): 3.125 mg via ORAL
  Filled 2018-06-03 (×7): qty 1

## 2018-06-03 MED FILL — Medication: Qty: 1 | Status: AC

## 2018-06-03 MED FILL — Amiodarone HCl Inj 150 MG/3ML (50 MG/ML): INTRAVENOUS | Qty: 3 | Status: AC

## 2018-06-03 MED FILL — Nitroglycerin IV Soln 100 MCG/ML in D5W: INTRA_ARTERIAL | Qty: 10 | Status: AC

## 2018-06-03 NOTE — Progress Notes (Signed)
Progress Note  Patient Name: Lindsey Pierce Date of Encounter: 06/03/2018  Primary Cardiologist: Lovena Le  Subjective   No chest pain or dyspnea this am.  Events of yesterday afternoon noted. Sustained VT that responded to IV amiodarone.   Inpatient Medications    Scheduled Meds: . aspirin  81 mg Oral Daily  . atorvastatin  80 mg Oral q1800  . enoxaparin (LOVENOX) injection  40 mg Subcutaneous Q24H  . insulin aspart  0-15 Units Subcutaneous TID WC  . insulin aspart  0-5 Units Subcutaneous QHS  . mouth rinse  15 mL Mouth Rinse BID  . Melatonin  6 mg Oral QHS  . sodium chloride flush  3 mL Intravenous Q12H  . ticagrelor  90 mg Oral BID   Continuous Infusions: . sodium chloride Stopped (06/02/18 1113)  . amiodarone 30 mg/hr (06/03/18 0600)  . norepinephrine (LEVOPHED) Adult infusion Stopped (06/01/18 0014)   PRN Meds: sodium chloride, acetaminophen, ondansetron (ZOFRAN) IV, sodium chloride, sodium chloride flush   Vital Signs    Vitals:   06/03/18 0400 06/03/18 0500 06/03/18 0630 06/03/18 0700  BP: (!) 142/84 (!) 131/109 (!) 147/76 130/67  Pulse:      Resp: 20 19 (!) 22 19  Temp:      TempSrc:      SpO2: 97% 95% 93% 94%  Weight:      Height:        Intake/Output Summary (Last 24 hours) at 06/03/2018 0753 Last data filed at 06/03/2018 0600 Gross per 24 hour  Intake 912.62 ml  Output 2175 ml  Net -1262.38 ml   Filed Weights   05/31/18 2345  Weight: 247 lb 2.2 oz (112.1 kg)    Telemetry    Sinus - Personally Reviewed  ECG    No AM EKG - Personally Reviewed  Physical Exam   GEN: No acute distress.   Neck: No JVD Cardiac: RRR, no murmurs, rubs, or gallops.  Respiratory: Clear to auscultation bilaterally. GI: Soft, nontender, non-distended  MS: No edema; No deformity. Neuro:  Nonfocal  Psych: Normal affect   Labs    Chemistry Recent Labs  Lab 05/31/18 2108  06/02/18 0402 06/02/18 1554 06/03/18 0254  NA 140   < > 138 139 139  K 3.5   < > 3.2*  3.7 3.5  CL 106   < > 104 105 103  CO2 23   < > 27 25 26   GLUCOSE 234*   < > 169* 180* 169*  BUN 17   < > 13 11 11   CREATININE 0.99   < > 0.79 0.76 0.77  CALCIUM 8.7*   < > 8.2* 8.2* 8.2*  PROT 6.8  --   --   --   --   ALBUMIN 3.9  --   --   --   --   AST 34  --   --   --   --   ALT 38  --   --   --   --   ALKPHOS 69  --   --   --   --   BILITOT 0.6  --   --   --   --   GFRNONAA 58*   < > >60 >60 >60  GFRAA >60   < > >60 >60 >60  ANIONGAP 11   < > 7 9 10    < > = values in this interval not displayed.     Hematology Recent Labs  Lab 06/01/18 0402 06/02/18 1287  06/03/18 0254  WBC 11.2* 9.1 8.3  RBC 5.03 4.48 4.70  HGB 14.5 12.9 13.7  HCT 43.6 39.2 41.0  MCV 86.7 87.5 87.2  MCH 28.8 28.8 29.1  MCHC 33.3 32.9 33.4  RDW 13.5 13.5 13.5  PLT 220 189 200    Cardiac Enzymes Recent Labs  Lab 05/31/18 2108 06/01/18 0402 06/01/18 1007 06/01/18 1700  TROPONINI 0.23* >65.00* >65.00* 56.85*   No results for input(s): TROPIPOC in the last 168 hours.   BNPNo results for input(s): BNP, PROBNP in the last 168 hours.   DDimer No results for input(s): DDIMER in the last 168 hours.   Radiology    No results found.  Cardiac Studies   Echo 06/01/18: - Left ventricle: The cavity size was normal. Wall thickness was   increased in a pattern of moderate LVH. There was severe focal   basal hypertrophy of the septum. Systolic function was mildly to   moderately reduced. The estimated ejection fraction was in the   range of 40% to 45%. Features are consistent with a pseudonormal   left ventricular filling pattern, with concomitant abnormal   relaxation and increased filling pressure (grade 2 diastolic   dysfunction). The outflow tract showed peak gradient of 23 mmHg   with Valsalva. - Regional wall motion abnormality: Akinesis of the mid   anteroseptal, apical septal, and apical myocardium; hypokinesis   of the mid-apical anterior and mid inferoseptal myocardium. This    abnormality is consistent with ischemia or infarction. - Aorta: Balloon pump present. - Mitral valve: Calcified annulus. There was no systolic anterior   motion. There was mild regurgitation. - Right ventricle: Systolic function was normal. - Tricuspid valve: There was trivial regurgitation. - Pulmonic valve: There was no significant regurgitation.  Impressions:  - Significant wall motion abnormalities, consistent with infarction   in LAD territory. Presence of balloon pump noted. Septal   hypertrophy with peak gradient of 23 mmHg in LVOT.   Patient Profile     67 y.o. female with history of HTN, HLD admitted with an acute anterior STEMI secondary to occlusion of the mid LAD on 05/31/18. A drug eluting stent was placed in the mid LAD and balloon angioplasty of the jailed Diagonal branch was performed.   Assessment & Plan    1. CAD/Acute inferior STEMI: She is doing well post anterior MI. Shock resolved. IABP has been removed. LVEF 40-45% by echo. Will continue ASA/Brilinta for one year. Will continue high intensity statin. Will start a beta blocker and low dose Ace-inh today.   2. Ischemic cardiomyopathy: Will start a beta blocker and Ace-inh today.   3. Hypokalemia: Will replace potassium this am  4. Sustained VT: Sustained VT yesterday afternoon. She converted to sinus on IV amiodarone. Sinus this am. No VT over last 18 hours. Will start a beta blocker today. Will change amiodarone to po.   5. Atrial fibrillation: No recurrence over last 24 hours. Will not need long term anti-coagulation if there is no recurrence.   Will leave in ICU this am. If no recurrent VT, will consider transfer to tele unit later today  For questions or updates, please contact [facility] Please consult [email or web address] for contact info under Cardiology/STEMI.      Signed, Lauree Chandler, MD  06/03/2018, 7:53 AM

## 2018-06-03 NOTE — Plan of Care (Signed)
  Problem: Health Behavior/Discharge Planning: °Goal: Ability to manage health-related needs will improve °Outcome: Progressing °  °Problem: Clinical Measurements: °Goal: Ability to maintain clinical measurements within normal limits will improve °Outcome: Progressing °Goal: Diagnostic test results will improve °Outcome: Progressing °Goal: Respiratory complications will improve °Outcome: Progressing °Goal: Cardiovascular complication will be avoided °Outcome: Progressing °  °Problem: Activity: °Goal: Risk for activity intolerance will decrease °Outcome: Progressing °  °

## 2018-06-03 NOTE — Progress Notes (Signed)
CARDIAC REHAB PHASE I   PRE:  Rate/Rhythm: 73 SR  BP:  Supine: 134/88  Sitting:   Standing:    SaO2: 97%RA  MODE:  Ambulation: 200 ft   POST:  Rate/Rhythm: 91 SR  BP:  Supine:   Sitting: 151/72  Standing:    SaO2: 96%RA 1045-1145 Pt walked 200 ft on RA with asst x 1 with slow steady gait. No CP. Tolerated well. MI education completed with pt and son who voiced understanding. Stressed Importance of brilinta with stent. Needs to see case manager. Discussed NTG use, MI restrictions, risk factors, ex ed, heart healthy food choices and CRP 2. Will refer to [facility] program. Discussed with pt that her A1c was 7.3. She stated she was on prednisone mid March to April. Encouraged pt to watch carbs and follow up with Medical Doctor. To recliner with call bell.   Graylon Good, RN BSN  06/03/2018 11:42 AM

## 2018-06-04 LAB — GLUCOSE, CAPILLARY
GLUCOSE-CAPILLARY: 139 mg/dL — AB (ref 70–99)
GLUCOSE-CAPILLARY: 144 mg/dL — AB (ref 70–99)
GLUCOSE-CAPILLARY: 119 mg/dL — AB (ref 70–99)
Glucose-Capillary: 114 mg/dL — ABNORMAL HIGH (ref 70–99)

## 2018-06-04 LAB — BASIC METABOLIC PANEL
ANION GAP: 8 (ref 5–15)
BUN: 16 mg/dL (ref 8–23)
CO2: 26 mmol/L (ref 22–32)
CREATININE: 0.79 mg/dL (ref 0.44–1.00)
Calcium: 8.4 mg/dL — ABNORMAL LOW (ref 8.9–10.3)
Chloride: 105 mmol/L (ref 98–111)
Glucose, Bld: 139 mg/dL — ABNORMAL HIGH (ref 70–99)
Potassium: 3.5 mmol/L (ref 3.5–5.1)
SODIUM: 139 mmol/L (ref 135–145)

## 2018-06-04 LAB — CBC
HCT: 38.8 % (ref 36.0–46.0)
HEMOGLOBIN: 12.7 g/dL (ref 12.0–15.0)
MCH: 28.7 pg (ref 26.0–34.0)
MCHC: 32.7 g/dL (ref 30.0–36.0)
MCV: 87.8 fL (ref 78.0–100.0)
PLATELETS: 205 10*3/uL (ref 150–400)
RBC: 4.42 MIL/uL (ref 3.87–5.11)
RDW: 13.5 % (ref 11.5–15.5)
WBC: 7.5 10*3/uL (ref 4.0–10.5)

## 2018-06-04 LAB — HEMOGLOBIN A1C
Hgb A1c MFr Bld: 7.1 % — ABNORMAL HIGH (ref 4.8–5.6)
Mean Plasma Glucose: 157.07 mg/dL

## 2018-06-04 MED ORDER — HYDROCORTISONE 1 % EX CREA
1.0000 "application " | TOPICAL_CREAM | Freq: Three times a day (TID) | CUTANEOUS | Status: DC | PRN
  Administered 2018-06-04: 1 via TOPICAL
  Filled 2018-06-04 (×2): qty 28

## 2018-06-04 MED ORDER — STUDY - AEGIS II STUDY - PLACEBO OR CSL112 (PI-HILTY)
170.0000 mL | INTRAVENOUS | Status: DC
  Administered 2018-06-04: 170 mL via INTRAVENOUS
  Filled 2018-06-04: qty 170

## 2018-06-04 MED ORDER — POTASSIUM CHLORIDE CRYS ER 20 MEQ PO TBCR
40.0000 meq | EXTENDED_RELEASE_TABLET | Freq: Once | ORAL | Status: AC
  Administered 2018-06-04: 40 meq via ORAL
  Filled 2018-06-04: qty 2

## 2018-06-04 NOTE — Progress Notes (Signed)
Progress Note  Patient Name: Lindsey Pierce Date of Encounter: 06/04/2018  Primary Cardiologist: Lovena Le  Subjective   No chest pain or dyspnea.   Inpatient Medications    Scheduled Meds: . amiodarone  400 mg Oral BID  . aspirin  81 mg Oral Daily  . atorvastatin  80 mg Oral q1800  . carvedilol  3.125 mg Oral BID WC  . enoxaparin (LOVENOX) injection  40 mg Subcutaneous Q24H  . insulin aspart  0-15 Units Subcutaneous TID WC  . insulin aspart  0-5 Units Subcutaneous QHS  . lisinopril  5 mg Oral Daily  . Melatonin  6 mg Oral QHS  . sodium chloride flush  3 mL Intravenous Q12H  . ticagrelor  90 mg Oral BID   Continuous Infusions: . sodium chloride Stopped (06/02/18 1113)  . norepinephrine (LEVOPHED) Adult infusion Stopped (06/01/18 0014)   PRN Meds: sodium chloride, acetaminophen, ondansetron (ZOFRAN) IV, sodium chloride, sodium chloride flush   Vital Signs    Vitals:   06/04/18 0500 06/04/18 0600 06/04/18 0700 06/04/18 0800  BP: (!) 111/56 (!) 108/56 140/66 122/60  Pulse:      Resp: 17 19 18 17   Temp:      TempSrc:      SpO2: 98% 96% 97% 96%  Weight:      Height:        Intake/Output Summary (Last 24 hours) at 06/04/2018 0816 Last data filed at 06/04/2018 0300 Gross per 24 hour  Intake 840 ml  Output 978 ml  Net -138 ml   Filed Weights   05/31/18 2345  Weight: 247 lb 2.2 oz (112.1 kg)    Telemetry    sinus- Personally Reviewed  ECG    No AM EKG - Personally Reviewed  Physical Exam   General: Well developed, well nourished, NAD  HEENT: OP clear, mucus membranes moist  SKIN: warm, dry. No rashes. Neuro: No focal deficits  Musculoskeletal: Muscle strength 5/5 all ext  Psychiatric: Mood and affect normal  Neck: No JVD, no carotid bruits, no thyromegaly, no lymphadenopathy.  Lungs:Clear bilaterally, no wheezes, rhonci, crackles Cardiovascular: Regular rate and rhythm. No murmurs, gallops or rubs. Abdomen:Soft. Bowel sounds present. Non-tender.    Extremities: No lower extremity edema. Pulses are 2 + in the bilateral DP/PT.   Labs    Chemistry Recent Labs  Lab 05/31/18 2108  06/02/18 1554 06/03/18 0254 06/04/18 0242  NA 140   < > 139 139 139  K 3.5   < > 3.7 3.5 3.5  CL 106   < > 105 103 105  CO2 23   < > 25 26 26   GLUCOSE 234*   < > 180* 169* 139*  BUN 17   < > 11 11 16   CREATININE 0.99   < > 0.76 0.77 0.79  CALCIUM 8.7*   < > 8.2* 8.2* 8.4*  PROT 6.8  --   --   --   --   ALBUMIN 3.9  --   --   --   --   AST 34  --   --   --   --   ALT 38  --   --   --   --   ALKPHOS 69  --   --   --   --   BILITOT 0.6  --   --   --   --   GFRNONAA 58*   < > >60 >60 >60  GFRAA >60   < > >60 >60 >  60  ANIONGAP 11   < > 9 10 8    < > = values in this interval not displayed.     Hematology Recent Labs  Lab 06/02/18 0402 06/03/18 0254 06/04/18 0242  WBC 9.1 8.3 7.5  RBC 4.48 4.70 4.42  HGB 12.9 13.7 12.7  HCT 39.2 41.0 38.8  MCV 87.5 87.2 87.8  MCH 28.8 29.1 28.7  MCHC 32.9 33.4 32.7  RDW 13.5 13.5 13.5  PLT 189 200 205    Cardiac Enzymes Recent Labs  Lab 05/31/18 2108 06/01/18 0402 06/01/18 1007 06/01/18 1700  TROPONINI 0.23* >65.00* >65.00* 56.85*   No results for input(s): TROPIPOC in the last 168 hours.   BNPNo results for input(s): BNP, PROBNP in the last 168 hours.   DDimer No results for input(s): DDIMER in the last 168 hours.   Radiology    No results found.  Cardiac Studies   Echo 06/01/18: - Left ventricle: The cavity size was normal. Wall thickness was   increased in a pattern of moderate LVH. There was severe focal   basal hypertrophy of the septum. Systolic function was mildly to   moderately reduced. The estimated ejection fraction was in the   range of 40% to 45%. Features are consistent with a pseudonormal   left ventricular filling pattern, with concomitant abnormal   relaxation and increased filling pressure (grade 2 diastolic   dysfunction). The outflow tract showed peak gradient of 23  mmHg   with Valsalva. - Regional wall motion abnormality: Akinesis of the mid   anteroseptal, apical septal, and apical myocardium; hypokinesis   of the mid-apical anterior and mid inferoseptal myocardium. This   abnormality is consistent with ischemia or infarction. - Aorta: Balloon pump present. - Mitral valve: Calcified annulus. There was no systolic anterior   motion. There was mild regurgitation. - Right ventricle: Systolic function was normal. - Tricuspid valve: There was trivial regurgitation. - Pulmonic valve: There was no significant regurgitation.  Impressions:  - Significant wall motion abnormalities, consistent with infarction   in LAD territory. Presence of balloon pump noted. Septal   hypertrophy with peak gradient of 23 mmHg in LVOT.   Patient Profile     67 y.o. female with history of HTN, HLD admitted with an acute anterior STEMI secondary to occlusion of the mid LAD on 05/31/18. A drug eluting stent was placed in the mid LAD and balloon angioplasty of the jailed Diagonal branch was performed.   Assessment & Plan    1. CAD/Acute inferior STEMI: She is doing well post anterior MI. Shock resolved. IABP has been removed. LVEF 40-45% by echo. Continue ASA and Brilinta. Continue statin, beta blocker and Ace-inh.    2. Ischemic cardiomyopathy: Continue beta blocker and Ace-inh  3. Hypokalemia: Will replace potassium today.   4. Sustained VT: No further VT on amiodarone. Sinus today. Continue beta blocker and amiodarone.    5. Atrial fibrillation: No recurrence over last 48 hours. No need for long term anticoagulation.    6. Elevated blood sugars: Will check HgbA1C today. She may need an oral agent before discharge.   Transfer to tele bed today. Probable discharge home tomorrow.      For questions or updates, please contact [place] Please consult [email or web address] for contact info under Cardiology/STEMI.      Signed, Lauree Chandler, MD  06/04/2018,  8:16 AM

## 2018-06-04 NOTE — Progress Notes (Signed)
Pt arrived to unit from [facility] via wheelchair. Alert and oriented x4, VSS, on RA. Pt placed on portable tele box, sinus rhythm. Son at bedside. No issues at this time. Will continue to monitor pt and orient to the unit. Leanne Chang, RN

## 2018-06-04 NOTE — Plan of Care (Signed)
  Problem: Health Behavior/Discharge Planning: Goal: Ability to manage health-related needs will improve Outcome: Progressing   Problem: Clinical Measurements: Goal: Ability to maintain clinical measurements within normal limits will improve Outcome: Progressing Goal: Diagnostic test results will improve Outcome: Progressing Goal: Respiratory complications will improve Outcome: Progressing Goal: Cardiovascular complication will be avoided Outcome: Progressing   Problem: Pain Managment: Goal: General experience of comfort will improve Outcome: Progressing

## 2018-06-04 NOTE — Progress Notes (Signed)
CARDIAC REHAB PHASE I   PRE:  Rate/Rhythm: 22 SR  BP:  Supine: 129/78  Sitting:   Standing:    SaO2: 92%RA  MODE:  Ambulation: 370 ft   POST:  Rate/Rhythm: 98 SR  BP:  Supine:   Sitting: 124/82  Standing:    SaO2: 100%RA 1025-1042 Pt walked 370 ft on RA with steady gait and tolerated well. No SOB noted. To recliner with call bell. No questions re ed done yesterday.   Graylon Good, RN BSN  06/04/2018 10:38 AM

## 2018-06-04 NOTE — Care Management Note (Addendum)
Case Management Note Marvetta Gibbons RN,BSN Unit [facility]22 Case Manager  [phone]  Patient Details  Name: Lindsey Pierce MRN: [id number] Date of Birth: Dec 17, 1950  Subjective/Objective:  Pt admitted with STEMI  S/p drug eluting stent and balloon angioplasty IABP has been removed 7/23, noted pt started on Brilinta- benefits check submitted                Action/Plan: PTA pt lived at home, anticipate return home. CM will f/u with pt once Brilinta insurance check returns and provide 30 day free card prior to discharge.  Per benefits check pt does not have drug coverage- spoke with pt at bedside- confirmed with pt that she does not have part D coverage-no prescription plan for medication coverage- pt provided 30 day free card to use on discharge and application for pt assistance (MD will need to sign form)- pt instructed to call toll free # with Brilinta for assistance with drug assistance. Also discussed GoodRx for resource.  Pt has PCP and will f/u with cardiology.  Pt looking into adding part D however will still need pt assistance to bridge gap until part D coverage starts. CM will continue to follow for any further transition of care needs   Expected Discharge Date:                  Expected Discharge Plan:     In-House Referral:     Discharge planning Services  CM Consult, Medication Assistance  Post Acute Care Choice:    Choice offered to:     DME Arranged:    DME Agency:     HH Arranged:    HH Agency:     Status of Service:  In process, will continue to follow  If discussed at Long Length of Stay Meetings, dates discussed:    Discharge Disposition:   Additional Comments:  Dawayne Patricia, RN 06/04/2018, 11:36 AM

## 2018-06-05 LAB — GLUCOSE, CAPILLARY
GLUCOSE-CAPILLARY: 173 mg/dL — AB (ref 70–99)
GLUCOSE-CAPILLARY: 151 mg/dL — AB (ref 70–99)
GLUCOSE-CAPILLARY: 138 mg/dL — AB (ref 70–99)
Glucose-Capillary: 130 mg/dL — ABNORMAL HIGH (ref 70–99)
Glucose-Capillary: 111 mg/dL — ABNORMAL HIGH (ref 70–99)

## 2018-06-05 LAB — BASIC METABOLIC PANEL
ANION GAP: 8 (ref 5–15)
BUN: 16 mg/dL (ref 8–23)
CO2: 22 mmol/L (ref 22–32)
Calcium: 8.6 mg/dL — ABNORMAL LOW (ref 8.9–10.3)
Chloride: 108 mmol/L (ref 98–111)
Creatinine, Ser: 0.77 mg/dL (ref 0.44–1.00)
GLUCOSE: 159 mg/dL — AB (ref 70–99)
POTASSIUM: 3.6 mmol/L (ref 3.5–5.1)
Sodium: 138 mmol/L (ref 135–145)

## 2018-06-05 LAB — CBC
HCT: 40.4 % (ref 36.0–46.0)
Hemoglobin: 13.5 g/dL (ref 12.0–15.0)
MCH: 29.1 pg (ref 26.0–34.0)
MCHC: 33.4 g/dL (ref 30.0–36.0)
MCV: 87.1 fL (ref 78.0–100.0)
PLATELETS: 222 10*3/uL (ref 150–400)
RBC: 4.64 MIL/uL (ref 3.87–5.11)
RDW: 13.3 % (ref 11.5–15.5)
WBC: 7.4 10*3/uL (ref 4.0–10.5)

## 2018-06-05 NOTE — Progress Notes (Signed)
Pt. Complains of N/V/D. Meds given for nausea. Will continue to monitor.

## 2018-06-05 NOTE — Progress Notes (Signed)
CARDIAC REHAB PHASE I   PRE:  Rate/Rhythm: 63 SR  BP:  Supine: 134/74  Sitting: 139/78  Standing: 124/78   SaO2:   MODE:  Ambulation: stepped in place ft   POST:  Rate/Rhythm: 64 SR  BP:  Supine: 146/78  Sitting:   Standing:    SaO2: 96%RA 1145-1205 Came to see pt to walk. Pt on BSC.  Had pt lay down after being assisted by her RN. Did orthostatics as above noted. Pt did not feel up to walking so I had her to walk in place. She stated a little light headed and became dizzy when moving head suddenly. Had pt lie down and encouraged her to drink fluid and to try to eat lunch as she stated she had diarrhea and emesis last night. Encouraged her to try to walk with staff later if she feels that she can. Will follow up tomorrow.   Graylon Good, RN BSN  06/05/2018 12:03 PM

## 2018-06-05 NOTE — Care Management Important Message (Signed)
Important Message  Patient Details  Name: Lindsey Pierce MRN: [id number] Date of Birth: August 22, 1951   Medicare Important Message Given:  Yes    Orbie Pyo 06/05/2018, 4:05 PM

## 2018-06-05 NOTE — Progress Notes (Addendum)
Progress Note  Patient Name: Lindsey Pierce Date of Encounter: 06/05/2018  Primary Cardiologist: No primary care provider on file.  Subjective   Had a rough night, nausea/vomiting/diarrhea. Feels weak this morning. No chest pain, but feels dizzy.   Inpatient Medications    Scheduled Meds: . AEGIS II (PI-Hilty)  170 mL Intravenous Q7 days  . amiodarone  400 mg Oral BID  . aspirin  81 mg Oral Daily  . atorvastatin  80 mg Oral q1800  . carvedilol  3.125 mg Oral BID WC  . enoxaparin (LOVENOX) injection  40 mg Subcutaneous Q24H  . insulin aspart  0-15 Units Subcutaneous TID WC  . insulin aspart  0-5 Units Subcutaneous QHS  . lisinopril  5 mg Oral Daily  . Melatonin  6 mg Oral QHS  . sodium chloride flush  3 mL Intravenous Q12H  . ticagrelor  90 mg Oral BID   Continuous Infusions: . sodium chloride Stopped (06/02/18 1113)   PRN Meds: sodium chloride, acetaminophen, hydrocortisone cream, ondansetron (ZOFRAN) IV, sodium chloride, sodium chloride flush   Vital Signs    Vitals:   06/05/18 0300 06/05/18 0332 06/05/18 0725 06/05/18 0800  BP:  139/71 (!) 151/94   Pulse: (!) 54 70 66   Resp: 18 19 (!) 21 15  Temp:  (!) 97.5 F (36.4 C) 98 F (36.7 C)   TempSrc:  Oral Oral   SpO2: (!) 81% 95% 96%   Weight:      Height:        Intake/Output Summary (Last 24 hours) at 06/05/2018 0956 Last data filed at 06/04/2018 2309 Gross per 24 hour  Intake 663 ml  Output 250 ml  Net 413 ml   Filed Weights   05/31/18 2345  Weight: 247 lb 2.2 oz (112.1 kg)    Telemetry    SR - Personally Reviewed  Physical Exam   General: Well developed, well nourished, obese older W female appearing in no acute distress. Head: Normocephalic, atraumatic.  Neck: Supple, no JVD. Lungs:  Resp regular and unlabored, CTA. Heart: RRR, S1, S2, no  murmur; no rub. Abdomen: Soft, non-tender, non-distended with normoactive bowel sounds.  Extremities: No clubbing, cyanosis, edema. Distal pedal pulses are  2+ bilaterally. Neuro: Alert and oriented X 3. Moves all extremities spontaneously. Psych: Normal affect.  Labs    Chemistry Recent Labs  Lab 05/31/18 2108  06/03/18 0254 06/04/18 0242 06/05/18 0250  NA 140   < > 139 139 138  K 3.5   < > 3.5 3.5 3.6  CL 106   < > 103 105 108  CO2 23   < > 26 26 22   GLUCOSE 234*   < > 169* 139* 159*  BUN 17   < > 11 16 16   CREATININE 0.99   < > 0.77 0.79 0.77  CALCIUM 8.7*   < > 8.2* 8.4* 8.6*  PROT 6.8  --   --   --   --   ALBUMIN 3.9  --   --   --   --   AST 34  --   --   --   --   ALT 38  --   --   --   --   ALKPHOS 69  --   --   --   --   BILITOT 0.6  --   --   --   --   GFRNONAA 58*   < > >60 >60 >60  GFRAA >60   < > >  60 >60 >60  ANIONGAP 11   < > 10 8 8    < > = values in this interval not displayed.     Hematology Recent Labs  Lab 06/03/18 0254 06/04/18 0242 06/05/18 0250  WBC 8.3 7.5 7.4  RBC 4.70 4.42 4.64  HGB 13.7 12.7 13.5  HCT 41.0 38.8 40.4  MCV 87.2 87.8 87.1  MCH 29.1 28.7 29.1  MCHC 33.4 32.7 33.4  RDW 13.5 13.5 13.3  PLT 200 205 222    Cardiac Enzymes Recent Labs  Lab 05/31/18 2108 06/01/18 0402 06/01/18 1007 06/01/18 1700  TROPONINI 0.23* >65.00* >65.00* 56.85*   No results for input(s): TROPIPOC in the last 168 hours.   BNPNo results for input(s): BNP, PROBNP in the last 168 hours.   DDimer No results for input(s): DDIMER in the last 168 hours.    Radiology    No results found.  Cardiac Studies   Cath: 05/31/18  Conclusions: 1. Anterior STEMI due to acute plaque rupture and thrombotic thrombotic subtotal occlusion of the mid LAD and D2. 2. Moderate to severe but non-critical disease involving rPL branches. 3. Cardiogenic shock with mildly to moderately elevated LVEDP. 4. Successful PCI to mid LAD/D2 using Synergy 2.75 x 16 mm drug eluting stent in the LAD with 0% residual stenosis and angioplasty of the ostium of D2 with reduction of stenosis from 99% to 70%. 5. Successful placement of 40  mL intra-aortic balloon pump via the right femoral artery. 6. Atrial fibrillation with rapid ventricular response with unsuccessful DCCV x 1 at 200 J.  Rate control improved with amiodarone infusion.  Recommendations: 1. Administer ticagrelor 180 mg when possible and discontinue cangrelor 2 hours after ticagrelor administration. 2. Titrate off norepinephrine as tolerated. 3. Aggressive secondary prevention, including high-intensity statin therapy. 4. Start heparin infusion 2 hours after TR band removal. 5. Continue amiodarone infusion for rate control of atrial fibrillation.  Consider transitioning to beta-blocker once shock has resolved. 6. CXR when patient arrives in ICU to confirm IABP placement. 7. Wean BiPAP, as tolerated.  Recommend uninterrupted dual antiplatelet therapy with Aspirin 81mg  daily and Ticagrelor 90mg  twice daily for a minimum of 12 months (ACS - Class I recommendation).   [name], MD  TTE: 06/01/18  Study Conclusions  - Left ventricle: The cavity size was normal. Wall thickness was   increased in a pattern of moderate LVH. There was severe focal   basal hypertrophy of the septum. Systolic function was mildly to   moderately reduced. The estimated ejection fraction was in the   range of 40% to 45%. Features are consistent with a pseudonormal   left ventricular filling pattern, with concomitant abnormal   relaxation and increased filling pressure (grade 2 diastolic   dysfunction). The outflow tract showed peak gradient of 23 mmHg   with Valsalva. - Regional wall motion abnormality: Akinesis of the mid   anteroseptal, apical septal, and apical myocardium; hypokinesis   of the mid-apical anterior and mid inferoseptal myocardium. This   abnormality is consistent with ischemia or infarction. - Aorta: Balloon pump present. - Mitral valve: Calcified annulus. There was no systolic anterior   motion. There was mild regurgitation. - Right ventricle: Systolic  function was normal. - Tricuspid valve: There was trivial regurgitation. - Pulmonic valve: There was no significant regurgitation.  Impressions:  - Significant wall motion abnormalities, consistent with infarction   in LAD territory. Presence of balloon pump noted. Septal   hypertrophy with peak gradient of 23 mmHg in  LVOT.  Patient Profile     67 y.o. female with history of HTN, HLD admitted with an acute anterior STEMI secondary to occlusion of the mid LAD on 05/31/18. A drug eluting stent was placed in the mid LAD and balloon angioplasty of the jailed Diagonal branch was performed.  Assessment & Plan    1. CAD/Acute inferior STEMI: underwent PCI/DES to the mLAD with balloon angioplasty of a jailed diag branch. Shock resolved. IABP has been removed. LVEF 40-45% by echo. Continue ASA and Brilinta. Continue statin, beta blocker and Ace-inh.    2. Ischemic cardiomyopathy: EF noted at 40-45% with WMA in he LAD territory. Continue beta blocker and Ace-inh  3. Hypokalemia: resolved.   4. Sustained VT: No further VT on amiodarone. Remains in SR. Continue beta blocker and amiodarone.    5. Atrial fibrillation: No recurrence over last 48 hours. No plans for long term anticoagulation at this tim until recurrence noted.    6. Elevated blood sugars: Hgb A1c 7.1. Will need to consider oral agent at the time of discharge. Consider Jardiance.   7. Dizziness/weakness: Reports having episode of n/v/d last evening. Feels dizzy this morning. Blood pressures stable on review.  -- check orthostatics.    Signed, Reino Bellis, NP  06/05/2018, 9:56 AM  Pager # [phone]   For questions or updates, please contact [place] Please consult [email or web address] for contact info under Cardiology/STEMI.  I have personally seen and examined this patient. I agree with the assessment and plan as outlined above.  She has some nausea and diarrhea this am. Also dizzy. Cardiac issues stable. Will check  orthostatics. BP in bed is normal. Rhythm stable. Monitor today. Hopefully home tomorrow.   Lauree Chandler 06/05/2018 10:37 AM

## 2018-06-05 NOTE — Plan of Care (Signed)
Continue with plan of care.  

## 2018-06-06 ENCOUNTER — Encounter (HOSPITAL_COMMUNITY): Payer: Self-pay | Admitting: Cardiology

## 2018-06-06 ENCOUNTER — Telehealth: Payer: Self-pay | Admitting: Internal Medicine

## 2018-06-06 DIAGNOSIS — E876 Hypokalemia: Secondary | ICD-10-CM

## 2018-06-06 DIAGNOSIS — I5021 Acute systolic (congestive) heart failure: Secondary | ICD-10-CM

## 2018-06-06 DIAGNOSIS — E785 Hyperlipidemia, unspecified: Secondary | ICD-10-CM

## 2018-06-06 DIAGNOSIS — I2102 ST elevation (STEMI) myocardial infarction involving left anterior descending coronary artery: Secondary | ICD-10-CM

## 2018-06-06 DIAGNOSIS — I10 Essential (primary) hypertension: Secondary | ICD-10-CM

## 2018-06-06 LAB — GLUCOSE, CAPILLARY
Glucose-Capillary: 133 mg/dL — ABNORMAL HIGH (ref 70–99)
Glucose-Capillary: 158 mg/dL — ABNORMAL HIGH (ref 70–99)

## 2018-06-06 LAB — BASIC METABOLIC PANEL
ANION GAP: 9 (ref 5–15)
BUN: 16 mg/dL (ref 8–23)
CHLORIDE: 106 mmol/L (ref 98–111)
CO2: 22 mmol/L (ref 22–32)
Calcium: 8.8 mg/dL — ABNORMAL LOW (ref 8.9–10.3)
Creatinine, Ser: 0.87 mg/dL (ref 0.44–1.00)
Glucose, Bld: 166 mg/dL — ABNORMAL HIGH (ref 70–99)
POTASSIUM: 3.7 mmol/L (ref 3.5–5.1)
SODIUM: 137 mmol/L (ref 135–145)

## 2018-06-06 LAB — CBC
HCT: 40.5 % (ref 36.0–46.0)
HEMOGLOBIN: 13.4 g/dL (ref 12.0–15.0)
MCH: 28.9 pg (ref 26.0–34.0)
MCHC: 33.1 g/dL (ref 30.0–36.0)
MCV: 87.5 fL (ref 78.0–100.0)
PLATELETS: 267 10*3/uL (ref 150–400)
RBC: 4.63 MIL/uL (ref 3.87–5.11)
RDW: 13.5 % (ref 11.5–15.5)
WBC: 8.4 10*3/uL (ref 4.0–10.5)

## 2018-06-06 MED ORDER — EMPAGLIFLOZIN 10 MG PO TABS: 10.0000 mg | ORAL_TABLET | Freq: Every day | ORAL | 1 refills | Status: DC

## 2018-06-06 MED ORDER — AMIODARONE HCL 200 MG PO TABS: ORAL_TABLET | ORAL | 1 refills | Status: DC

## 2018-06-06 MED ORDER — LIVING WELL WITH DIABETES BOOK
Freq: Once | Status: DC
  Filled 2018-06-06 (×4): qty 1

## 2018-06-06 MED ORDER — METFORMIN HCL 500 MG PO TABS: 500.0000 mg | ORAL_TABLET | Freq: Two times a day (BID) | ORAL | 1 refills | Status: DC

## 2018-06-06 MED ORDER — ATORVASTATIN CALCIUM 80 MG PO TABS: 80.0000 mg | ORAL_TABLET | Freq: Every day | ORAL | 1 refills | Status: DC

## 2018-06-06 MED ORDER — TICAGRELOR 90 MG PO TABS: 90.0000 mg | ORAL_TABLET | Freq: Two times a day (BID) | ORAL | 2 refills | Status: DC

## 2018-06-06 MED ORDER — NITROGLYCERIN 0.4 MG SL SUBL: 0.4000 mg | SUBLINGUAL_TABLET | SUBLINGUAL | 2 refills | Status: DC | PRN

## 2018-06-06 MED ORDER — CARVEDILOL 3.125 MG PO TABS: 3.1250 mg | ORAL_TABLET | Freq: Two times a day (BID) | ORAL | 2 refills | Status: DC

## 2018-06-06 MED ORDER — ASPIRIN 81 MG PO CHEW: 81.0000 mg | CHEWABLE_TABLET | Freq: Every day | ORAL | Status: DC

## 2018-06-06 MED ORDER — LISINOPRIL 5 MG PO TABS: 5.0000 mg | ORAL_TABLET | Freq: Every day | ORAL | 2 refills | Status: DC

## 2018-06-06 NOTE — Progress Notes (Signed)
Received diabetes coordinator consult. Noted that patient has new onset diabetes. Spoke with patient about the new diagnosis. Stated that her HgbA1C was 5.9% in the past. Reviewed her current A1C of 7.1% and the average blood sugar for that A1C. Reviewed normal blood sugar, checking blood sugars at home with meter, eating and plate method. States that she drinks Propel at home (states that it is sugar free) Given Living Well with Diabetes booklet from pharmacy. Will need to follow up with PCP at discharge.   Harvel Ricks RN BSN CDE Diabetes Coordinator Pager: [phone]  8am-5pm

## 2018-06-06 NOTE — Telephone Encounter (Signed)
TCM....  Patient is being discharged   They saw Dr. Saunders Revel   They are scheduled to see Dr. Saunders Revel on 8/8  They were seen for Cath  They need to be seen within 7-10 days  Pt not added to wait list   Please call

## 2018-06-06 NOTE — Progress Notes (Signed)
Removed PIV access and patient received the discharge instructions. Diabetes coordinator talked pt & son. Pt understood well for discharge instructions. Patient's son took her all belongings. HS [employer]

## 2018-06-06 NOTE — Discharge Summary (Signed)
Discharge Summary    Patient ID: Lindsey Pierce,  MRN: [id number], DOB/AGE: December 02, 1950 67 y.o.  Admit date: 05/31/2018 Discharge date: 06/06/2018  Primary Care Provider: [name] (Inactive) Primary Cardiologist: Dr. Saunders Revel  Discharge Diagnoses    Active Problems:   Cardiogenic shock ([place])   ST elevation (STEMI) myocardial infarction involving left anterior descending coronary artery [facility])   STEMI involving left anterior descending coronary artery (HCC)   Hypertension   Hyperlipidemia   Hypokalemia   Acute systolic heart failure (HCC)   Acute ST elevation myocardial infarction (STEMI) involving left anterior descending (LAD) coronary artery (HCC)   Allergies Allergies  Allergen Reactions  . Codeine Swelling    Mouth and tongue swelling (couldn't swallow)  . Darvon [Propoxyphene Hcl] Swelling    Mouth and tongue swelling (couldn't swallow)  . Penicillins Hives    Has patient had a PCN reaction causing immediate rash, facial/tongue/throat swelling, SOB or lightheadedness with hypotension: Yes Has patient had a PCN reaction causing severe rash involving mucus membranes or skin necrosis: No Has patient had a PCN reaction that required hospitalization: No Has patient had a PCN reaction occurring within the last 10 years: No If all of the above answers are "NO", then may proceed with Cephalosporin use.   Diagnostic Studies/Procedures    Cath: 05/31/18  Conclusions: 1. Anterior STEMI due to acute plaque rupture and thrombotic thrombotic subtotal occlusion of the mid LAD and D2. 2. Moderate to severe but non-critical disease involving rPL branches. 3. Cardiogenic shock with mildly to moderately elevated LVEDP. 4. Successful PCI to mid LAD/D2 using Synergy 2.75 x 16 mm drug eluting stent in the LAD with 0% residual stenosis and angioplasty of the ostium of D2 with reduction of stenosis from 99% to 70%. 5. Successful placement of 40 mL intra-aortic balloon pump via the right  femoral artery. 6. Atrial fibrillation with rapid ventricular response with unsuccessful DCCV x 1 at 200 J.  Rate control improved with amiodarone infusion.  Recommendations: 1. Administer ticagrelor 180 mg when possible and discontinue cangrelor 2 hours after ticagrelor administration. 2. Titrate off norepinephrine as tolerated. 3. Aggressive secondary prevention, including high-intensity statin therapy. 4. Start heparin infusion 2 hours after TR band removal. 5. Continue amiodarone infusion for rate control of atrial fibrillation.  Consider transitioning to beta-blocker once shock has resolved. 6. CXR when patient arrives in ICU to confirm IABP placement. 7. Wean BiPAP, as tolerated.  Recommend uninterrupted dual antiplatelet therapy with Aspirin 81mg  daily and Ticagrelor 90mg  twice daily for a minimum of 12 months (ACS - Class I recommendation).   Lindsey Bush, MD [facility] _____________   History of Present Illness     67 y.o. female with a history of hypertension and hyperlipidemia presents with complaints of substernal chest pain that started 45 minutes prior to presentation to the hospital. She was apparently walking in a grocery store when she had the pain. There was associated diaphoresis and lightheadedness. EMS found her to be in mild distress. She was treated with ASA 324 mg and 1 dose of NTG. HR was in the 160's. Suspicious for atrial fibrillation with RVR and ST elevations (anterolateral leads). Systolic blood pressure was in the 80s.  The STEMI team was activated and she was taken urgently to the cardiac catheterization lab for STEMI and cardiogenic shock.  Hospital Course   Underwent cardiac cath noted above with acute plaque rupture and thrombotic occlusion of the mLAD and D2. Had successful PCI of the mLAD with  DES x1, along with angioplasty of the ostium of D2. Noted to be in cardiogenic shock in the cath lab with balloon pump placed. She was in rapid Afib  and attempted but unsuccessful DCCV x1. Placed on IV amiodarone for rate control. Plan for DAPT with ASA/Brilinta for at least one year. Trop peaked at >65. The balloon pump was weaned the following day and able to be removed. IV amiodarone was weaned. Given this Afib was felt to be the result of her acute MI, it was decided not to place her on [place] at this time. After stopping her amiodarone she had a episode of sustained VT and given IV bolus of amiodarone with return to SR. She was then transitioned to 400mg  BID and added BB therapy as well. Follow up echo showed EF of 40-45%. LDL noted at 134. Also placed on high dose statin and low dose ACEi. No recurrence of VT noted. She started working well with cardiac rehab without recurrent chest pain. Agreed to participate in [facility] and received her first transfusion on 06/04/18. Felt weak and had an episode of n/v/d, therefore was kept an additional day to observe. Orthostatics were negative. Felt much better after getting rest. Hgb A1c was noted at 7.1 this admission and initial plan was to start on Jardiance 10mg  daily at the time of discharge, but unable to afford copay of $500. Therefore will place on metformin 500mg  BID with plans to titrate up as an outpatient. Will plan to discharge with amiodarone at 200mg  BID for 2 weeks then 200mg  daily afterwards.   General: Well developed, well nourished, female appearing in no acute distress. Head: Normocephalic, atraumatic.  Neck: Supple without bruits, JVD. Lungs:  Resp regular and unlabored, CTA. Heart: RRR, S1, S2, no S3, S4, or murmur; no rub. Abdomen: Soft, non-tender, non-distended with normoactive bowel sounds. No hepatomegaly. No rebound/guarding. No obvious abdominal masses. Extremities: No clubbing, cyanosis, edema. Distal pedal pulses are 2+ bilaterally. L/R femoral cath site stable with bruising but no hematoma Neuro: Alert and oriented X 3. Moves all extremities spontaneously. Psych: Normal  affect.  Luara Pierce was seen by Dr. Angelena Form and determined stable for discharge home. Follow up in the office has been arranged. Medications are listed below.   _____________  Discharge Vitals Blood pressure 116/68, pulse 61, temperature 98.2 F (36.8 C), temperature source Oral, resp. rate 18, height 5\' 1"  (1.549 m), weight 247 lb 2.2 oz (112.1 kg), SpO2 96 %.  Filed Weights   05/31/18 2345  Weight: 247 lb 2.2 oz (112.1 kg)    Labs & Radiologic Studies    CBC Recent Labs    06/05/18 0250 06/06/18 0241  WBC 7.4 8.4  HGB 13.5 13.4  HCT 40.4 40.5  MCV 87.1 87.5  PLT 222 563   Basic Metabolic Panel Recent Labs    06/05/18 0250 06/06/18 0241  NA 138 137  K 3.6 3.7  CL 108 106  CO2 22 22  GLUCOSE 159* 166*  BUN 16 16  CREATININE 0.77 0.87  CALCIUM 8.6* 8.8*   Liver Function Tests No results for input(s): AST, ALT, ALKPHOS, BILITOT, PROT, ALBUMIN in the last 72 hours. No results for input(s): LIPASE, AMYLASE in the last 72 hours. Cardiac Enzymes No results for input(s): CKTOTAL, CKMB, CKMBINDEX, TROPONINI in the last 72 hours. BNP Invalid input(s): POCBNP D-Dimer No results for input(s): DDIMER in the last 72 hours. Hemoglobin A1C Recent Labs    06/04/18 0242  HGBA1C 7.1*   Fasting Lipid  Panel No results for input(s): CHOL, HDL, LDLCALC, TRIG, CHOLHDL, LDLDIRECT in the last 72 hours. Thyroid Function Tests No results for input(s): TSH, T4TOTAL, T3FREE, THYROIDAB in the last 72 hours.  Invalid input(s): FREET3 _____________  Dg Chest Port 1 View  Result Date: 06/01/2018 CLINICAL DATA:  Intra-aortic balloon pump assistance. EXAM: PORTABLE CHEST 1 VIEW COMPARISON:  Chest x-ray from yesterday. FINDINGS: Stable cardiomegaly. Unchanged intra-aortic balloon pump positioning. Normal pulmonary vascularity. Chronic, mildly coarsened interstitial markings are similar to prior study. No focal consolidation, pleural effusion, or pneumothorax. No acute osseous  abnormality. IMPRESSION: Stable cardiomegaly and positioning of the intra-aortic balloon pump. No active disease. Electronically Signed   By: Titus Dubin M.D.   On: 06/01/2018 09:19   Dg Chest Port 1 View  Result Date: 06/01/2018 CLINICAL DATA:  Intra-aortic balloon pump assist. EXAM: PORTABLE CHEST 1 VIEW COMPARISON:  05/22/2012 FINDINGS: Stable cardiomegaly. Mild aortic atherosclerosis. Metallic indicator for an intra aortic balloon pump is seen approximately 2.1 cm caudad from the top of the aortic arch. Chronic mild coarsened interstitial prominence of the lungs. No alveolar consolidation or CHF. No effusion or pneumothorax. IMPRESSION: Intra-aortic balloon pump is noted with indicator just caudad to the aortic arch as above. Stable cardiomegaly. Chronic coarsened interstitial lung markings without alveolar consolidation. Electronically Signed   By: Ashley Royalty M.D.   On: 06/01/2018 00:00   Disposition   Pt is being discharged home today in good condition.  Follow-up Plans & Appointments    Follow-up Information    End, Harrell Gave, MD Follow up on 06/20/2018.   Specialty:  Cardiology Why:  at 3:20pm for your follow up appt.  Contact information: [address] [place] [address] [phone]        [name], NP Follow up.   Specialty:  Nurse Practitioner Contact information: [address] [place] [address] [phone]          Discharge Instructions    AMB Referral to Cardiac Rehabilitation - Phase II   Complete by:  As directed    Diagnosis:   STEMI Coronary Stents     Amb Referral to Cardiac Rehabilitation   Complete by:  As directed    Diagnosis:   STEMI Coronary Stents     Call MD for:  redness, tenderness, or signs of infection (pain, swelling, redness, odor or green/yellow discharge around incision site)   Complete by:  As directed    Diet - low sodium heart healthy   Complete by:  As directed    Discharge  instructions   Complete by:  As directed    Groin Site Care Refer to this sheet in the next few weeks. These instructions provide you with information on caring for yourself after your procedure. Your caregiver may also give you more specific instructions. Your treatment has been planned according to current medical practices, but problems sometimes occur. Call your caregiver if you have any problems or questions after your procedure. HOME CARE INSTRUCTIONS You may shower 24 hours after the procedure. Remove the bandage (dressing) and gently wash the site with plain soap and water. Gently pat the site dry.  Do not apply powder or lotion to the site.  Do not sit in a bathtub, swimming pool, or whirlpool for 5 to 7 days.  No bending, squatting, or lifting anything over 10 pounds (4.5 kg) as directed by your caregiver.  Inspect the site at least twice daily.  Do not drive home if you are  discharged the same day of the procedure. Have someone else drive you.  You may drive 24 hours after the procedure unless otherwise instructed by your caregiver.  What to expect: Any bruising will usually fade within 1 to 2 weeks.  Blood that collects in the tissue (hematoma) may be painful to the touch. It should usually decrease in size and tenderness within 1 to 2 weeks.  SEEK IMMEDIATE MEDICAL CARE IF: You have unusual pain at the groin site or down the affected leg.  You have redness, warmth, swelling, or pain at the groin site.  You have drainage (other than a small amount of blood on the dressing).  You have chills.  You have a fever or persistent symptoms for more than 72 hours.  You have a fever and your symptoms suddenly get worse.  Your leg becomes pale, cool, tingly, or numb.  You have heavy bleeding from the site. Hold pressure on the site. Marland Kitchen  PLEASE DO NOT MISS ANY DOSES OF YOUR BRILINTA!!!!! Also keep a log of you blood pressures and bring back to your follow up appt. Please call the office with  any questions.   Patients taking blood thinners should generally stay away from medicines like ibuprofen, Advil, Motrin, naproxen, and Aleve due to risk of stomach bleeding. You may take Tylenol as directed or talk to your primary doctor about alternatives.   Increase activity slowly   Complete by:  As directed       Discharge Medications     Medication List    STOP taking these medications   naproxen sodium 220 MG tablet Commonly known as:  ALEVE     TAKE these medications   amiodarone 200 MG tablet Commonly known as:  PACERONE Take 200mg  (1 tablet) twice a day for 2 weeks, then 200mg  daily after that.   aspirin 81 MG chewable tablet Chew 1 tablet (81 mg total) by mouth daily.   atorvastatin 80 MG tablet Commonly known as:  LIPITOR Take 1 tablet (80 mg total) by mouth daily at 6 PM.   carvedilol 3.125 MG tablet Commonly known as:  COREG Take 1 tablet (3.125 mg total) by mouth 2 (two) times daily with a meal.   lisinopril 5 MG tablet Commonly known as:  PRINIVIL,ZESTRIL Take 1 tablet (5 mg total) by mouth daily.   metFORMIN 500 MG tablet Commonly known as:  GLUCOPHAGE Take 1 tablet (500 mg total) by mouth 2 (two) times daily with a meal.   multivitamin with minerals Tabs tablet Take 1 tablet by mouth daily.   nitroGLYCERIN 0.4 MG SL tablet Commonly known as:  NITROSTAT Place 1 tablet (0.4 mg total) under the tongue every 5 (five) minutes as needed.   ticagrelor 90 MG Tabs tablet Commonly known as:  BRILINTA Take 1 tablet (90 mg total) by mouth 2 (two) times daily.       Acute coronary syndrome (MI, NSTEMI, STEMI, etc) this admission?: Yes.     AHA/ACC Clinical Performance & Quality Measures: 8. Aspirin prescribed? - Yes 9. ADP Receptor Inhibitor (Plavix/Clopidogrel, Brilinta/Ticagrelor or Effient/Prasugrel) prescribed (includes medically managed patients)? - Yes 10. Beta Blocker prescribed? - Yes 11. High Intensity Statin (Lipitor 40-80mg  or Crestor  20-40mg ) prescribed? - Yes 12. EF assessed during THIS hospitalization? - Yes 13. For EF <40%, was ACEI/ARB prescribed? - Yes 14. For EF <40%, Aldosterone Antagonist (Spironolactone or Eplerenone) prescribed? - Not Applicable (EF >/= 37%) 15. Cardiac Rehab Phase II ordered (Included Medically managed Patients)? - Yes  Outstanding Labs/Studies   FLP/LFTs in 6 weeks if tolerating statin, BMET at follow up appt.   Duration of Discharge Encounter   Greater than 30 minutes including physician time.  Signed, Reino Bellis NP-C 06/06/2018, 12:26 PM   I have personally seen and examined this patient. I agree with the assessment and plan as outlined above.  She is doing well. No chest pain, N/V. Will continue current therapy.  Amiodarone 200 BID. Taper at office follow up.  Discharge home today.  Follow up with DR. End 1-2 weeks [place] office  Lauree Chandler MD 06/06/2018 12:26 PM

## 2018-06-06 NOTE — Research (Signed)
[facility] Visit 1 and 2  Dr Lia Foyer reviewed labs and saw patient before randomization.   Inclusions:   Y N   [x]  []  Female or female at least 67 years of age  [x]  []  Evidence of type I (spontaneous) MI as defined by the following:  [x]  []  a. Detection of a rise and/or fall in Troponin I or T with at least 1 value about the 99% upper reference limit.  []  []   (AND)---  Any 1 or more of the following:   [x]  []  - symptoms of ischemia (ie, resulting from a primary coronary    artery event)  []  []  - New or presumably new significant ST/T wave changes or left bundle branch block.  []  []       - Development of pathological Q waves on EKG  []  []  - Imaging evidence of new loss or viable myocardium or regional wall motion abnormality.  []  []  - ID of intracoronary thrombus by angiography.  [x]  []  No suspicion of acute kidney injury at least 12 hours after angiography OR after first medical contract for subject's not undergoing angiography There must be documented evidence of stable renal function defined as no more than an increase in Serum Creatinine < 0.21m/dl from pre-contrast serum creatinine value.  (Before _0.90__   12 hrs after _0.79_)  [x]  []  Evidence of multi-vessel coronary artery disease defined as:  [x]  []  A. At least 50% stenosis on >1 epicardial artery or left main artery on catherization performed during the index hospitalization.  []  []  B. Prior cardiac catherization with at least 50% stenosis on >1 epicardial artery or left main artery.  []  []  C. Prior PCI and evidence of at least 50% stenosis of at least 1 epicardial artery different from prior revascularized artery.  []  []  D. Prior multivessel coronary artery bypass grafting.  []  []  Plus either Established risk factors:  [x]  []  o On pharmacological treatment for diabetes mellitus                 OR    TWO of the following  []  []  o Prior history of MI  []  []  o Age ? 65 years  []  []  o Peripheral arterial disease defined as meeting at least  1 of the following criteria:  []  []          +   Current intermittent claudication or resting limb ischemia and ABI    ?0.90  []  []          +   History of peripheral revascularization (surgical or percutaneous)  []  []          +  History of limb amputation due to PAD  []  []          +  Angiographic evidence (using computed tomographic angiography, MRA, or invasive angiography or a peripheral artery stenosis ?50%.  [x]  []  If the female subject without child bearing potential, not breastfeeding, not pregnant, and if of child bearing potential agree to contraception or lifestyle methods to avoid pregnancy? Child-bearing potential (must select all)   ____ not pregnant (by urine or serum hCG AND   ____ willing to use an acceptable method of contraception to avoid pregnancy during the study and for 3 months after last dose of investional product (refer to acceptable methods per protocol)   ____ if breastfeeding, willing to cease breastfeeding Date of pregnancy test: (____ /_____/ ________)  Result  - or + Not of Child bearing potential (select one)   _X__ Age >=  42   ____ Age 72-60 with amenorrhea for at least 1 year with documented evidence of follicle-stimulating hormone level >40 IU/L   ____ Surgically sterile for at least 3 months prior to randomization  [x]  []  Investigator believes that the subject is willing and able to adhere to all protocol requirements.   [x]  []  Willing to not participate in another investigational study until completion of their final study visit.     Exclusions:  Y N   []  [x]  If these are the reason for MI (pt is excluded)  []  [x]  1. Myocardial necrosis due mismatch between myocardial oxygen demand and supply, usually due to fixed coronary disease with increased demand leading to MI  []  [x]  2. Cardiac death due to MI  []  [x]  3. Myocardial necrosis due to complications from a PCI  []  [x]  4. Myocardial necrosis due to stent thrombosis  []  [x]  5. Myocardial necrosis due to  in stent restenosis as the only etiology  []  [x]  6. Myocardial necrosis in the stenting of coronary artery bypass grafting  []  [x]  Ongoing hemodynamic instability  []  [x]       +  History of NYHA Class III or IV heart failure within the last year  []  [x]       +  Killip Class III or IV heart failure  []  [x]       +  Sustained and/or symptomatic hypotension (SBP <90 mm HG)  []  [x]       +  Known left ventricular ejection fraction of <30%  []  [x]  Evidence of hepatobiliary disease as indicated by any 1 or more of the   following at screening:  []  [x]       +  Current active hepatic dysfunction or active biliary obstruction  []  [x]       +       +  Chronic or prior history of cirrhosis or of infectious / inflammatory hepatitis NOTE: If a patient has a medical history of recovered Hep A, B, or C without evidence of cirrhosis, he/she could be considered for inclusion if there is documented evidence that there is no active infection (ie, antigen negative)  []  [x]       + Hepatic lab abnormalities: ALT > 3 x ULN or Total bilirubin > 2x ULN at randomization.  []  [x]  Severe chronic kidney disease (eGFR of <77m) or on dialysis  []  [x]  Plan to undergo scheduled coronary artery bypass graft surgery after randomization, as determined at the time of screening  []  [x]  Known history of allergies to soybeans, peanuts, albumin  []  [x]  Body weight <50 kg  []  [x]  A known history of IgA deficiency or antibodies to IgA  []  [x]  A comorbid condition with an estimated life expectancy of ? 6 months at time of consent  []  [x]  Women who are pregnant or breastfeeding at time of randomization  []  [x]  Participated in another interventional clinical study at the time of consent  []  [x]  Treatment with anticancer therapy  []  [x]  Previously randomized or participated in this study or previously exposed to CSL112   DEMOGRAPHICS:  Patient Name: Lindsey RomagnoliBirth Date: [birth year] Sex: Female Race: white  Child Bearing: ? Yes    ? No  ? Tubial ligation ? Hysterectomy  ? postmenopausal   Height: 154 cm Weight: 112 kg   Index Procedure:  Onset date of symptoms: 19-Jul-19 Onset of symptoms: 2000  Date of First contact at hospital: 19-Jul-19 Time of first contact at hospital: 2048  Admission Date: 19-Jul-19   Discharge Date: 25-Jul-19 Discharge Time: 2694   Vital Signs: Date 8/54/6270    Time: Click or tap here to enter text.   BP: 83  Pulse: 127    Concomitant medications: Every visit: ? See med sheet  BMP Pre Contrast IV 05/31/18 @ 2103  0.90  CMP Post Contrast IV 12 hours later: 06/04/18 @ 0242  0.79 Hepatic Panel: 05/31/18 @ 2108   ALT: 38 Total Bili: 0.6 Direct Bili: ND   Medical History:  ? CAD ? Prior MI ? PAD  ? History of Heart Failure ? Moderate to severe valvular dx ? AFib  ? Prior Coronary Revascularization  if YES please select Yes or No below:  CABG ? Yes   ? No           PCI with stent ? Yes   ? No          PCI without stent ? Yes ? No  ? CVA if checked please select one of the following Choose an item.  ? Hypertension  ? Gilberts syndrome ? CKD   ? Hypocholesteremia ? DM ? Smoker        ? eCigarette  Killip Class Stage 1     EQ-5D-3L ?  Future Biomedical Research: Consented ? Yes     ? No If no please date they withdrew consent from biomedical research Click or tap to enter a date.  Central Labs Before Start of Infusion: ? Biochemistry panel      ? Hematology     ? Immunogenicity   (30 mins before infusion)   ? Parvovirus  ? FBR sample ? PK/PD sample Central Labs End of Infusion:   ? PK/PD Central Blood Draw Time: Before SOI: 06/04/18 @ 1510_ After EOI: _07/23/19 @ 1730_ Infusion Start Time: 06/04/2018 3:15 PM  Infusion End Time: 06/04/2018 5:15 PM VS before infusion 06/04/18 @ 1400 BP 113/63  HR 63   EQ-5D-5L  MOBILITY:    I HAVE NO PROBLEMS WALKING [x]   I HAVE SLIGHT PROBLEMS WALKING []   I HAVE MODERATE PROBLEMS WALKING []   I HAVE SEVERE PROBLEMS WALKING []   I AM UNABLE TO WALK  []      SELF-CARE:   I HAVE NO PROBLEMS WASING OR DRESSING MYSELF  [x]   I HAVE SLIGHT PROBLEMS WASHING OR DRESSING MYSELF  []   I HAVE MODERATE PROBLEMS WASHING OR DRESSING MYSELF []   I HAVE SEVERE PROBLEMS WASHING OR DRESSING MYSELF  []   I HAVE SEVERE PROBLEMS WASHING OR DRESSING MYSELF  []   I AM UNABLE TO WASH OR DRESS MYSELF []     USUAL ACTIVITIES: (E.G. WORK/STUDY/HOUSEWORK/FAMILY OR LEISURE ACTIVITIES.    I HAVE NO PROBLEMS DOING MY USUAL ACTIVITIES [x]   I HAVE SLIGHT PROBLEMS DOING MY USUAL ACTIVITIES []   I HAVE MODERATE PROBLEMS DOING MY USUAL ACTIVIITIES []   I HAVE SEVERE PROBLEMS DOING MY USUAL ACTIVITIES []   I AM UNABLE TO DO MY USUAL ACTIVITIES []     PAIN /DISCOMFORT   I HAVE NO PAIN OR DISCOMFORT [x]   I HAVE SLIGHT PAIN OR DISCOMFORT []   I HAVE MODERATE PAIN OR DISCOMFORT []   I HAVE SEVERE PAIN OR DISCOMFORT []   I HAVE EXTREME PAIN OR DISCOMFORT []     ANXIETY/DEPRESSION   I AM NOT ANXIOUS OR DEPRESSED [x]   I AM SLIGHTLY ANXIOUS OR DEPRESSED []   I AM MODERATELY ANXIOUS OR DREPRESSED []   I AM SEVERELY ANXIOUS OR DEPRESSED []   I AM EXTREMELY ANXIOUS OR DEPRESSED []   SCALE OF 0-100 HOW WOULD YOU RATE TODAY?  0 IS THE WORSE AND 100 IS THE BEST HEALTH YOU CAN IMAGINE: 80   Pre Cath labs    05/31/18 @ 2103  Sodium 135 - 145 mmol/L 142   Potassium 3.5 - 5.1 mmol/L 3.5   Chloride 98 - 111 mmol/L 103   BUN 8 - 23 mg/dL 19   Creatinine, Ser 0.44 - 1.00 mg/dL 0.90   Glucose, Bld 70 - 99 mg/dL 231High    Calcium, Ion 1.15 - 1.40 mmol/L 1.15   TCO2 22 - 32 mmol/L 23   Hemoglobin 12.0 - 15.0 g/dL 13.9   HCT 36.0 - 46.0 % 41.   Post cath labs   06/04/18 @ 0242  Sodium 135 - 145 mmol/L 139   Potassium 3.5 - 5.1 mmol/L 3.5   Chloride 98 - 111 mmol/L 105   CO2 22 - 32 mmol/L 26   Glucose, Bld 70 - 99 mg/dL 139High    BUN 8 - 23 mg/dL 16   Creatinine, Ser 0.44 - 1.00 mg/dL 0.79   Calcium 8.9 - 10.3 mg/dL 8.4Low    GFR calc non Af Amer >60 mL/min >60   GFR calc Af Amer >60  mL/min >60    CMP   05/31/18 @ 2108  Sodium 135 - 145 mmol/L 140   Potassium 3.5 - 5.1 mmol/L 3.5   Chloride 98 - 111 mmol/L 106   Comment: Please note change in reference range.  CO2 22 - 32 mmol/L 23   Glucose, Bld 70 - 99 mg/dL 234High    Comment: Please note change in reference range.  BUN 8 - 23 mg/dL 17   Comment: Please note change in reference range.  Creatinine, Ser 0.44 - 1.00 mg/dL 0.99   Calcium 8.9 - 10.3 mg/dL 8.7Low    Total Protein 6.5 - 8.1 g/dL 6.8   Albumin 3.5 - 5.0 g/dL 3.9   AST 15 - 41 U/L 34   ALT 0 - 44 U/L 38   Comment: Please note change in reference range.  Alkaline Phosphatase 38 - 126 U/L 69   Total Bilirubin 0.3 - 1.2 mg/dL 0.6   GFR calc non Af Amer >60 mL/min 58Low    GFR calc Af Amer >60 mL/min >60

## 2018-06-06 NOTE — Progress Notes (Signed)
Consult received for insurance cover Jardiance for diabetes?  Patient does not have prescription drug coverage, only Medicare A&B;ardiance oral tablet 10 mg is around $523 for a supply of 30 tablets, depending on pharmacy you visit. Mindi Slicker [facility] [phone]

## 2018-06-06 NOTE — Progress Notes (Signed)
CARDIAC REHAB PHASE I   PRE:  Rate/Rhythm: 64 SR  BP:  Supine: 122/63  Sitting:   Standing:    SaO2: 95%RA  MODE:  Ambulation: 340 ft   POST:  Rate/Rhythm: 90 SR  BP:  Supine:   Sitting: 161/77  Standing:    SaO2: 96%RA 1000-1015 Pt walked 340 ft on RA with steady gait. No CP. Tolerated well. No dizziness.   Graylon Good, RN BSN  06/06/2018 10:08 AM

## 2018-06-07 ENCOUNTER — Telehealth (HOSPITAL_COMMUNITY): Payer: Self-pay

## 2018-06-07 NOTE — Research (Signed)
Visit 1 and 2  Infusion went well. No complaints of cp or sob.  Will see patient back next Monday for infusion 2 Central labs drawn this visit.                                    "CONSENT"   YES     NO   Continuing further Investigational Product and study visits for follow-up? [x]  []   Continuing consent from future biomedical research [x]  []                                    "EVENTS"    YES     NO  AE   (IF YES SEE SOURCE) []  [x]   SAE  (IF YES SEE SOURCE) []  [x]   ENDPOINT   (IF YES SEE SOURCE) []  [x]   REVASCULARIZATION  (IF YES SEE SOURCE) []  [x]   AMPUTATION   (IF YES SEE SOURCE) []  [x]   TROPONIN'S  (IF YES SEE SOURCE) []  [x]      Central labs

## 2018-06-07 NOTE — Telephone Encounter (Signed)
Pt insurance is active and benefits verified through Medicare A/B Co-pay $0.00, DED $185.00/$185.00 met, out of pocket $0.00/$0.00 met, co-insurance 20%. No pre-authorization required.. Passport, 06/07/18 @ 9:42AM, QVO#[id number]  Will contact patient to see if she is interested in the Cardiac Rehab Program. If interested, patient will need to complete follow up appt. Once completed, patient will be contacted for scheduling upon review by the RN Navigator.

## 2018-06-07 NOTE — Telephone Encounter (Signed)
Attempted to call pt in regurds to participating in the Cardiac Rehab program and to verify if she has Medicaid, as secondary.  Was not able to leave a VM.

## 2018-06-07 NOTE — Telephone Encounter (Signed)
No answer. No voicemail. 

## 2018-06-07 NOTE — Research (Signed)
[facility]GIS Informed Consent   Subject Name: Lindsey Pierce  Subject met inclusion and exclusion criteria.  The informed consent form, study requirements and expectations were reviewed with the subject and questions and concerns were addressed prior to the signing of the consent form.  The subject verbalized understanding of the trail requirements.  The subject agreed to participate in the AEGIS trial and signed the informed consent.  The informed consent was obtained prior to performance of any protocol-specific procedures for the subject.  A copy of the signed informed consent was given to the subject and a copy was placed in the subject's medical record.  [name] 06/04/18, 1415

## 2018-06-10 ENCOUNTER — Ambulatory Visit (HOSPITAL_COMMUNITY)
Admission: RE | Admit: 2018-06-10 | Discharge: 2018-06-10 | Disposition: A | Discharge status: Home / Self Care | Payer: Medicare Other | Source: Ambulatory Visit | Attending: Internal Medicine | Admitting: Internal Medicine

## 2018-06-10 ENCOUNTER — Encounter: Payer: Medicare Other | Admitting: *Deleted

## 2018-06-10 VITALS — BP 116/67 | HR 69

## 2018-06-10 DIAGNOSIS — Z006 Encounter for examination for normal comparison and control in clinical research program: Secondary | ICD-10-CM | POA: Insufficient documentation

## 2018-06-10 MED ORDER — STUDY - AEGIS II STUDY - PLACEBO OR CSL112 (PI-HILTY)
170.0000 mL | INTRAVENOUS | Status: DC
  Administered 2018-06-10: 170 mL via INTRAVENOUS
  Filled 2018-06-10 (×2): qty 170

## 2018-06-10 NOTE — Telephone Encounter (Signed)
I attempted to call the patient x 3 tries at her contact #. Message stating "number cannot be completed as dialed."  Will try back later.

## 2018-06-10 NOTE — Progress Notes (Signed)
Medications:  Current Outpatient Medications:  .  amiodarone (PACERONE) 200 MG tablet, Take 200mg  (1 tablet) twice a day for 2 weeks, then 200mg  daily after that., Disp: 60 tablet, Rfl: 1 .  aspirin 81 MG chewable tablet, Chew 1 tablet (81 mg total) by mouth daily., Disp: , Rfl:  .  atorvastatin (LIPITOR) 80 MG tablet, Take 1 tablet (80 mg total) by mouth daily at 6 PM., Disp: 90 tablet, Rfl: 1 .  carvedilol (COREG) 3.125 MG tablet, Take 1 tablet (3.125 mg total) by mouth 2 (two) times daily with a meal., Disp: 60 tablet, Rfl: 2 .  lisinopril (PRINIVIL,ZESTRIL) 5 MG tablet, Take 1 tablet (5 mg total) by mouth daily., Disp: 30 tablet, Rfl: 2 .  metFORMIN (GLUCOPHAGE) 500 MG tablet, Take 1 tablet (500 mg total) by mouth 2 (two) times daily with a meal., Disp: 60 tablet, Rfl: 1 .  Multiple Vitamin (MULTIVITAMIN WITH MINERALS) TABS, Take 1 tablet by mouth daily., Disp: , Rfl:  .  nitroGLYCERIN (NITROSTAT) 0.4 MG SL tablet, Place 1 tablet (0.4 mg total) under the tongue every 5 (five) minutes as needed., Disp: 25 tablet, Rfl: 2 .  ticagrelor (BRILINTA) 90 MG TABS tablet, Take 1 tablet (90 mg total) by mouth 2 (two) times daily., Disp: 180 tablet, Rfl: 2 No current facility-administered medications for this visit.   Facility-Administered Medications Ordered in Other Visits:  .  0.9 %  sodium chloride infusion, , , Continuous PRN, End, Christopher, MD, Last Rate: 999 mL/hr at 05/31/18 2102, 999 mL/hr at 05/31/18 2102 .  AEGIS II Study - Placebo / CSL112 (PI-Hilty), 170 mL, Intravenous, Q7 days, Hilty, Nadean Corwin, MD, Last Rate: 85 mL/hr at 06/10/18 0950, 170 mL at 06/10/18 0950 .  amiodarone (NEXTERONE PREMIX) 360-4.14 MG/200ML-% (1.8 mg/mL) IV infusion, , , Continuous PRN, End, Christopher, MD, Last Rate: 33.3 mL/hr at 05/31/18 2130, 60 mg/hr at 05/31/18 2130 .  amiodarone (NEXTERONE) 1.8 mg/mL load via infusion, , , PRN, End, Christopher, MD, 150 mg at 05/31/18 2123 .  fentaNYL (SUBLIMAZE) injection, , ,  PRN, End, Christopher, MD, 25 mcg at 05/31/18 2126 .  heparin injection, , , PRN, End, Harrell Gave, MD, 5,000 Units at 05/31/18 2105 .  lidocaine (PF) (XYLOCAINE) 1 % injection, , , PRN, End, Christopher, MD, 2 mL at 05/31/18 2058 .  midazolam (VERSED) injection, , , PRN, End, Christopher, MD, 1 mg at 05/31/18 2126 .  Radial Cocktail/Verapamil only, , , PRN, End, Christopher, MD, 10 mL at 05/31/18 2059  [id number]  Central labs  VS 116/67 69 96% C/O "knot" at right femoral access site. Assessed right groin 2 cm hard area noted not bleeding no bruit and no change in last 3 1/2 hours. Patient notice during morning shower. Dr. Lia Foyer aware Reino Bellis NP to assess. Local lab creatinine value to be signed off by [facility] before infusion  All central labs to be drawn before infusion except visit 5 PK only to be drawn after infusion  Lifestyle Adherence Assessment:    YES NO  Abstinence from smoking/remaining tobacco free X   Cardiac Diet X   Routine physical activity and/or cardiac rehabilitation X

## 2018-06-11 NOTE — Telephone Encounter (Signed)
Attempted to reach patient and message said, "This call cannot be completed as dialed."  Third attempt to reach patient, unsuccessful. Closing encounter.

## 2018-06-14 NOTE — Progress Notes (Signed)
Visit 3 Infusion went well. No complaints of cp or sob.  After the last infusion pt had one episode of diarrhea and felt nauseated about 10 hours after infusion. Will create AE.  Per Reuben Likes RN                                    "CONSENT"   YES     NO   Continuing further Investigational Product and study visits for follow-up? [x]  []   Continuing consent from future biomedical research [x]  []                                     "EVENTS"    YES     NO  AE   (IF YES SEE SOURCE) [x]  []   SAE  (IF YES SEE SOURCE) []  [x]   ENDPOINT   (IF YES SEE SOURCE) []  [x]   REVASCULARIZATION  (IF YES SEE SOURCE) []  [x]   AMPUTATION   (IF YES SEE SOURCE) []  [x]   TROPONIN'S  (IF YES SEE SOURCE) []  [x]    Central labs today:       Current Outpatient Medications:  .  amiodarone (PACERONE) 200 MG tablet, Take 200mg  (1 tablet) twice a day for 2 weeks, then 200mg  daily after that., Disp: 60 tablet, Rfl: 1 .  aspirin 81 MG chewable tablet, Chew 1 tablet (81 mg total) by mouth daily., Disp: , Rfl:  .  atorvastatin (LIPITOR) 80 MG tablet, Take 1 tablet (80 mg total) by mouth daily at 6 PM., Disp: 90 tablet, Rfl: 1 .  carvedilol (COREG) 3.125 MG tablet, Take 1 tablet (3.125 mg total) by mouth 2 (two) times daily with a meal., Disp: 60 tablet, Rfl: 2 .  lisinopril (PRINIVIL,ZESTRIL) 5 MG tablet, Take 1 tablet (5 mg total) by mouth daily., Disp: 30 tablet, Rfl: 2 .  metFORMIN (GLUCOPHAGE) 500 MG tablet, Take 1 tablet (500 mg total) by mouth 2 (two) times daily with a meal., Disp: 60 tablet, Rfl: 1 .  Multiple Vitamin (MULTIVITAMIN WITH MINERALS) TABS, Take 1 tablet by mouth daily., Disp: , Rfl:  .  nitroGLYCERIN (NITROSTAT) 0.4 MG SL tablet, Place 1 tablet (0.4 mg total) under the tongue every 5 (five) minutes as needed., Disp: 25 tablet, Rfl: 2 .  ticagrelor (BRILINTA) 90 MG TABS tablet, Take 1 tablet (90 mg total) by mouth 2 (two) times daily., Disp: 180 tablet, Rfl: 2 No current facility-administered  medications for this visit.   Facility-Administered Medications Ordered in Other Visits:  .  0.9 %  sodium chloride infusion, , , Continuous PRN, End, Christopher, MD, Last Rate: 999 mL/hr at 05/31/18 2102, 999 mL/hr at 05/31/18 2102 .  amiodarone (NEXTERONE PREMIX) 360-4.14 MG/200ML-% (1.8 mg/mL) IV infusion, , , Continuous PRN, End, Christopher, MD, Last Rate: 33.3 mL/hr at 05/31/18 2130, 60 mg/hr at 05/31/18 2130 .  amiodarone (NEXTERONE) 1.8 mg/mL load via infusion, , , PRN, End, Christopher, MD, 150 mg at 05/31/18 2123 .  fentaNYL (SUBLIMAZE) injection, , , PRN, End, Christopher, MD, 25 mcg at 05/31/18 2126 .  heparin injection, , , PRN, End, Harrell Gave, MD, 5,000 Units at 05/31/18 2105 .  lidocaine (PF) (XYLOCAINE) 1 % injection, , , PRN, End, Christopher, MD, 2 mL at 05/31/18 2058 .  midazolam (VERSED) injection, , , PRN, End, Christopher, MD, 1 mg at 05/31/18 2126 .  Radial Cocktail/Verapamil only, , , PRN, End, Christopher, MD, 10 mL at 05/31/18 2059

## 2018-06-17 ENCOUNTER — Encounter (HOSPITAL_COMMUNITY): Payer: Medicare Other

## 2018-06-18 ENCOUNTER — Ambulatory Visit (HOSPITAL_COMMUNITY)
Admission: RE | Admit: 2018-06-18 | Discharge: 2018-06-18 | Disposition: A | Discharge status: Home / Self Care | Payer: Medicare Other | Source: Ambulatory Visit | Attending: Internal Medicine | Admitting: Internal Medicine

## 2018-06-18 ENCOUNTER — Encounter: Payer: Medicare Other | Admitting: *Deleted

## 2018-06-18 VITALS — BP 112/55 | HR 63

## 2018-06-18 DIAGNOSIS — Z006 Encounter for examination for normal comparison and control in clinical research program: Secondary | ICD-10-CM

## 2018-06-18 MED ORDER — STUDY - AEGIS II STUDY - PLACEBO OR CSL112 (PI-HILTY)
170.0000 mL | INTRAVENOUS | Status: DC
  Administered 2018-06-18: 170 mL via INTRAVENOUS
  Filled 2018-06-18: qty 170

## 2018-06-18 NOTE — Progress Notes (Signed)
Visit 4 infusion 3  Pt doing well today. No complaints of cp or sob. Had a little stomach bug that lasted about 8 hours from Sunday night to yesterday morning. She rescheduled her appt to come this morning and is feeling much better. She said that she was with her nieces and nephews which had a stomach bug on Saturday and Sunday.                                    "CONSENT"   YES     NO   Continuing further Investigational Product and study visits for follow-up? [x]  []   Continuing consent from future biomedical research [x]  []                                   "EVENTS"    YES     NO  AE   (IF YES SEE SOURCE) []  [x]   SAE  (IF YES SEE SOURCE) []  [x]   ENDPOINT   (IF YES SEE SOURCE) []  [x]   REVASCULARIZATION  (IF YES SEE SOURCE) []  [x]   AMPUTATION   (IF YES SEE SOURCE) []  [x]   TROPONIN'S  (IF YES SEE SOURCE) []  [x]    Central labs drawn today:

## 2018-06-20 ENCOUNTER — Ambulatory Visit (INDEPENDENT_AMBULATORY_CARE_PROVIDER_SITE_OTHER): Payer: Medicare Other | Admitting: Internal Medicine

## 2018-06-20 ENCOUNTER — Encounter: Payer: Self-pay | Admitting: Internal Medicine

## 2018-06-20 VITALS — BP 150/88 | HR 78 | Ht 61.0 in | Wt 229.0 lb

## 2018-06-20 DIAGNOSIS — I480 Paroxysmal atrial fibrillation: Secondary | ICD-10-CM

## 2018-06-20 DIAGNOSIS — E785 Hyperlipidemia, unspecified: Secondary | ICD-10-CM

## 2018-06-20 DIAGNOSIS — I2102 ST elevation (STEMI) myocardial infarction involving left anterior descending coronary artery: Secondary | ICD-10-CM | POA: Diagnosis not present

## 2018-06-20 DIAGNOSIS — I472 Ventricular tachycardia, unspecified: Secondary | ICD-10-CM

## 2018-06-20 DIAGNOSIS — I10 Essential (primary) hypertension: Secondary | ICD-10-CM | POA: Diagnosis not present

## 2018-06-20 DIAGNOSIS — I255 Ischemic cardiomyopathy: Secondary | ICD-10-CM | POA: Diagnosis not present

## 2018-06-20 MED ORDER — CARVEDILOL 6.25 MG PO TABS: 6.2500 mg | ORAL_TABLET | Freq: Two times a day (BID) | ORAL | 3 refills | Status: DC

## 2018-06-20 MED ORDER — AMIODARONE HCL 200 MG PO TABS: 200.0000 mg | ORAL_TABLET | Freq: Every day | ORAL | 3 refills | Status: DC

## 2018-06-20 NOTE — Progress Notes (Signed)
Follow-up Outpatient Visit Date: 06/20/2018  Primary Care Provider: [name], NP (Inactive) No address on file  Chief Complaint: Follow-up recent STEMI  HPI:  Lindsey Pierce is a 67 y.o. year-old female with history of coronary artery disease with anterior STEMI complicated by cardiogenic shock (LVEF 40-45%), atrial fibrillation, and sustained ventricular tachycardia, as well as hypertension and hyperlipidemia, who presents for follow-up of CAD and recent MI.  The patient was hospitalized on 05/31/2018 after acute onset of chest pain.  She was found to be hypotensiveanterior ST elevation prompting emergent cardiac catheterization.  She was also in atrial fibrillation with rapid ventricular response at the time.  Emergent catheterization revealed subtotal occlusion of the mid LAD involving the LAD and a sizable diagonal branch.  Intra-aortic balloon pump was placed for hemodynamic support.  Emergent cardioversion was attempted in the Cath Lab, though the patient remained in atrial fibrillation.  She subsequently underwent primary PCI to the LAD and balloon angioplasty of the diagonal branch.  Her hemodynamics rapidly improved after PCI allowing weaning of balloon pump and vasopressors.  The patient also spontaneously converted to sinus rhythm on amiodarone.  However, after discontinuation of amiodarone, she developed ventricular tachycardia.  She was put back on amiodarone and did not have any further arrhythmias while hospitalized.  Today, Lindsey Pierce reports feeling remarkably well.  She has not had any further chest pain or shortness of breath, palpitations, lightheadedness, or edema.  She is tolerating dual antiplatelet therapy with aspirin and ticagrelor well, though she is concerned about affording ticagrelor up until her insurance kicks in on October 1.  She is somewhat concerned about decreasing amiodarone from 200 mg twice Pierce to 200 mg Pierce after today's dose, as she does not wish to have  recurrent ventricular tachycardia.  She is walking regularly at home and is eager to begin cardiac rehab.  --------------------------------------------------------------------------------------------------  Past Medical History:  Diagnosis Date  . Acute systolic (congestive) heart failure ([place])   . Diabetes ([place])   . Hyperlipidemia   . Hypertension   . PAF (paroxysmal atrial fibrillation) ([place])   . STEMI (ST elevation myocardial infarction) ([place])    06/01/18 PCI/DES x1 to the mLAD, IABP, EF 40-45%  . V-tach [facility])    Past Surgical History:  Procedure Laterality Date  . CORONARY/GRAFT ACUTE MI REVASCULARIZATION N/A 05/31/2018   Procedure: Coronary/Graft Acute MI Revascularization;  Surgeon: [name], MD;  Location: [place] [facility] LAB;  Service: Cardiovascular;  Laterality: N/A;  . IABP INSERTION N/A 05/31/2018   Procedure: IABP Insertion;  Surgeon: [name], MD;  Location: [place] [facility];  Service: Cardiovascular;  Laterality: N/A;  . LEFT HEART CATH AND CORONARY ANGIOGRAPHY N/A 05/31/2018   Procedure: LEFT HEART CATH AND CORONARY ANGIOGRAPHY;  Surgeon: [name], MD;  Location: [place] [facility];  Service: Cardiovascular;  Laterality: N/A;   Current Meds  Medication Sig  . aspirin 81 MG chewable tablet Chew 1 tablet (81 mg total) by mouth Pierce.  Marland Kitchen atorvastatin (LIPITOR) 80 MG tablet Take 1 tablet (80 mg total) by mouth Pierce at 6 PM.  . lisinopril (PRINIVIL,ZESTRIL) 5 MG tablet Take 1 tablet (5 mg total) by mouth Pierce.  . metFORMIN (GLUCOPHAGE) 500 MG tablet Take 1 tablet (500 mg total) by mouth 2 (two) times Pierce with a meal.  . Multiple Vitamin (MULTIVITAMIN WITH MINERALS) TABS Take 1 tablet by mouth Pierce.  . nitroGLYCERIN (NITROSTAT) 0.4 MG SL tablet Place 1 tablet (0.4 mg total) under the tongue every  5 (five) minutes as needed.  . ticagrelor (BRILINTA) 90 MG TABS tablet Take 1 tablet (90 mg total) by mouth 2 (two) times Pierce.  . [DISCONTINUED]  amiodarone (PACERONE) 200 MG tablet Take 200mg  (1 tablet) twice a day for 2 weeks, then 200mg  Pierce after that.  . [DISCONTINUED] carvedilol (COREG) 3.125 MG tablet Take 1 tablet (3.125 mg total) by mouth 2 (two) times Pierce with a meal.    Allergies: Codeine; Darvon [propoxyphene hcl]; and Penicillins  Social History   Tobacco Use  . Smoking status: Never Smoker  . Smokeless tobacco: Never Used  Substance Use Topics  . Alcohol use: No  . Drug use: No    History reviewed. No pertinent family history.  Review of Systems: A 12-system review of systems was performed and was negative except as noted in the HPI.  --------------------------------------------------------------------------------------------------  Physical Exam: BP (!) 150/88 (BP Location: Left Arm, Patient Position: Sitting, Cuff Size: Normal)   Pulse 78   Ht 5\' 1"  (1.549 m)   Wt 229 lb (103.9 kg)   BMI 43.27 kg/m   General: NAD. HEENT: No conjunctival pallor or scleral icterus. Moist mucous membranes.  OP clear. Neck: Supple without lymphadenopathy, thyromegaly, JVD, or HJR. No carotid bruit. Lungs: Normal work of breathing. Clear to auscultation bilaterally without wheezes or crackles. Heart: Regular rate and rhythm without murmurs, rubs, or gallops.  Unable to assess PMI due to body habitus. Abd: Bowel sounds present. Soft, NT/ND.  Unable to assess HSM due to body habitus. Ext: No lower extremity edema. Radial, PT, and DP pulses are 2+ bilaterally.  Right femoral arteriotomy/IABP site is well-healed.  No hematoma or bruit. Skin: Warm and dry without rash.  EKG: Normal sinus rhythm with left axis deviation, borderline LVH, and anterolateral MI.  Possible inferior Q waves also noted.  Lab Results  Component Value Date   WBC 8.4 06/06/2018   HGB 13.4 06/06/2018   HCT 40.5 06/06/2018   MCV 87.5 06/06/2018   PLT 267 06/06/2018    Lab Results  Component Value Date   NA 137 06/06/2018   K 3.7 06/06/2018    CL 106 06/06/2018   CO2 22 06/06/2018   BUN 16 06/06/2018   CREATININE 0.87 06/06/2018   GLUCOSE 166 (H) 06/06/2018   ALT 38 05/31/2018    Lab Results  Component Value Date   CHOL 198 05/31/2018   HDL 30 (L) 05/31/2018   LDLCALC 134 (H) 05/31/2018   TRIG 170 (H) 05/31/2018   CHOLHDL 6.6 05/31/2018    --------------------------------------------------------------------------------------------------  ASSESSMENT AND PLAN: Anterior STEMI Lindsey Pierce has recovered remarkably well from her anterior STEMI complicated by cardiogenic shock last month.  She is asymptomatic and tolerating evidence-based therapy well.  We will plan to complete at least 12 months of dual antiplatelet therapy with aspirin and ticagrelor.  We will try to procure samples for her so that she has an uninterrupted supply through early October when her insurance kicks in and she will be able to purchase ticagrelor without difficulty.  We will continue with high intensity statin therapy.  I will escalate carvedilol to 6.25 mg twice Pierce.  I have encouraged her to proceed with cardiac rehab.  Ischemic cardiomyopathy Lindsey Pierce appears euvolemic on exam today, the body habitus somewhat limits evaluation.  LVEF was moderately reduced following MI.  I will increase carvedilol to 6.25 mg twice Pierce.  We will continue her current dose of lisinopril.  We will plan to repeat an echo about 3  months after her event to reassess her LVEF.  Paroxysmal atrial fibrillation Occurred in the setting of anterior STEMI and resolved after PCI on amiodarone.  Patient is also on amiodarone still because of sustained VT in the setting of her MI.  I do not think that she warrants long-term anticoagulation at this point unless she has recurrence of atrial fibrillation, given that it occurred in the setting of critical illness.  Ventricular tachycardia This also occurred in the acute post-MI period after her STEMI.  She has not had any symptomatic  recurrence.  Given that her EF was greater than 35% and coronary artery disease completely revascularized, she was not discharged on a LifeVest.  I think it is reasonable to decrease amiodarone to 200 mg Pierce.  Ultimately, I would like to get her off of this completely.  We will also increase carvedilol to 6.25 mg twice Pierce today.  Hypertension Blood pressure normal.  We will escalate carvedilol, as above.  Hyperlipidemia LDL goal less than 70.  Continue high intensity statin therapy.  Morbid obesity Lindsey Pierce has already lost close to 20 pounds per her report at home and is down close to 10 pounds based on our measurements.  I congratulated her on her progress and encouraged her to stick with this.  Follow-up: Return to clinic in 1 month.  [name], MD 06/22/2018 11:02 AM

## 2018-06-20 NOTE — Patient Instructions (Addendum)
Medication Instructions:  Your physician has recommended you make the following change in your medication:  1- INCREASE Carvedilol to 6.25 mg by mouth two times a day. 2- CHANGE Amiodarone to 200 mg by mouth once a day.   Labwork: none  Testing/Procedures: none  Follow-Up: You have been referred to CARDIAC REHAB. Please call them next week if you have not heard from them. The number is listed below.  Your physician recommends that you schedule a follow-up appointment in: [place] APP.   If you need a refill on your cardiac medications before your next appointment, please call your pharmacy.   Medication Samples have been provided to the patient.  Drug name: Brilinta       Strength: 90mg          Qty: 5 bottles  LOT: [id number]  Exp.Date: 2/22

## 2018-06-22 ENCOUNTER — Encounter: Payer: Self-pay | Admitting: Internal Medicine

## 2018-06-22 DIAGNOSIS — I480 Paroxysmal atrial fibrillation: Secondary | ICD-10-CM | POA: Insufficient documentation

## 2018-06-22 DIAGNOSIS — I472 Ventricular tachycardia, unspecified: Secondary | ICD-10-CM | POA: Insufficient documentation

## 2018-06-22 DIAGNOSIS — Z6841 Body Mass Index (BMI) 40.0 and over, adult: Secondary | ICD-10-CM | POA: Insufficient documentation

## 2018-06-22 DIAGNOSIS — E66813 Obesity, class 3: Secondary | ICD-10-CM | POA: Insufficient documentation

## 2018-06-24 ENCOUNTER — Encounter: Payer: Medicare Other | Admitting: *Deleted

## 2018-06-24 ENCOUNTER — Ambulatory Visit (HOSPITAL_COMMUNITY)
Admission: RE | Admit: 2018-06-24 | Discharge: 2018-06-24 | Disposition: A | Discharge status: Home / Self Care | Payer: Medicare Other | Source: Ambulatory Visit | Attending: Internal Medicine | Admitting: Internal Medicine

## 2018-06-24 VITALS — BP 134/67 | HR 58

## 2018-06-24 DIAGNOSIS — Z006 Encounter for examination for normal comparison and control in clinical research program: Secondary | ICD-10-CM | POA: Insufficient documentation

## 2018-06-24 MED ORDER — STUDY - AEGIS II STUDY - PLACEBO OR CSL112 (PI-HILTY)
170.0000 mL | INTRAVENOUS | Status: DC
  Administered 2018-06-24: 170 mL via INTRAVENOUS
  Filled 2018-06-24: qty 170

## 2018-06-24 NOTE — Progress Notes (Signed)
[facility] infusion 4 visit 5  Pt doing well, no complaints of cp or sob.  Only med changes are amiodarone once a day now and coreg increased dose see below for listing of meds.   PK lab only lab drawn today!                                   "CONSENT"   YES     NO   Continuing further Investigational Product and study visits for follow-up? [x]  []   Continuing consent from future biomedical research [x]  []                                     "EVENTS"    YES     NO  AE   (IF YES SEE SOURCE) []  [x]   SAE  (IF YES SEE SOURCE) []  [x]   ENDPOINT   (IF YES SEE SOURCE) []  [x]   REVASCULARIZATION  (IF YES SEE SOURCE) []  [x]   AMPUTATION   (IF YES SEE SOURCE) []  [x]   TROPONIN'S  (IF YES SEE SOURCE) []  [x]        Current Outpatient Medications:  .  amiodarone (PACERONE) 200 MG tablet, Take 1 tablet (200 mg total) by mouth daily., Disp: 90 tablet, Rfl: 3 .  aspirin 81 MG chewable tablet, Chew 1 tablet (81 mg total) by mouth daily., Disp: , Rfl:  .  atorvastatin (LIPITOR) 80 MG tablet, Take 1 tablet (80 mg total) by mouth daily at 6 PM., Disp: 90 tablet, Rfl: 1 .  carvedilol (COREG) 6.25 MG tablet, Take 1 tablet (6.25 mg total) by mouth 2 (two) times daily., Disp: 180 tablet, Rfl: 3 .  lisinopril (PRINIVIL,ZESTRIL) 5 MG tablet, Take 1 tablet (5 mg total) by mouth daily., Disp: 30 tablet, Rfl: 2 .  metFORMIN (GLUCOPHAGE) 500 MG tablet, Take 1 tablet (500 mg total) by mouth 2 (two) times daily with a meal., Disp: 60 tablet, Rfl: 1 .  Multiple Vitamin (MULTIVITAMIN WITH MINERALS) TABS, Take 1 tablet by mouth daily., Disp: , Rfl:  .  nitroGLYCERIN (NITROSTAT) 0.4 MG SL tablet, Place 1 tablet (0.4 mg total) under the tongue every 5 (five) minutes as needed., Disp: 25 tablet, Rfl: 2 .  ticagrelor (BRILINTA) 90 MG TABS tablet, Take 1 tablet (90 mg total) by mouth 2 (two) times daily., Disp: 180 tablet, Rfl: 2 No current facility-administered medications for this visit.   Facility-Administered Medications  Ordered in Other Visits:  .  0.9 %  sodium chloride infusion, , , Continuous PRN, End, Christopher, MD, Last Rate: 999 mL/hr at 05/31/18 2102, 999 mL/hr at 05/31/18 2102 .  AEGIS II Study - Placebo / CSL112 (PI-Hilty), 170 mL, Intravenous, Q7 days, Hilty, Nadean Corwin, MD, Last Rate: 85 mL/hr at 06/24/18 0925, 170 mL at 06/24/18 0925 .  amiodarone (NEXTERONE PREMIX) 360-4.14 MG/200ML-% (1.8 mg/mL) IV infusion, , , Continuous PRN, End, Christopher, MD, Last Rate: 33.3 mL/hr at 05/31/18 2130, 60 mg/hr at 05/31/18 2130 .  amiodarone (NEXTERONE) 1.8 mg/mL load via infusion, , , PRN, End, Christopher, MD, 150 mg at 05/31/18 2123 .  fentaNYL (SUBLIMAZE) injection, , , PRN, End, Christopher, MD, 25 mcg at 05/31/18 2126 .  heparin injection, , , PRN, End, Harrell Gave, MD, 5,000 Units at 05/31/18 2105 .  lidocaine (PF) (XYLOCAINE) 1 % injection, , , PRN, End, Harrell Gave, MD, 2 mL  at 05/31/18 2058 .  midazolam (VERSED) injection, , , PRN, End, Christopher, MD, 1 mg at 05/31/18 2126 .  Radial Cocktail/Verapamil only, , , PRN, End, Christopher, MD, 10 mL at 05/31/18 2059

## 2018-06-26 ENCOUNTER — Telehealth (HOSPITAL_COMMUNITY): Payer: Self-pay

## 2018-06-26 NOTE — Telephone Encounter (Signed)
Patient called in regards to Cardiac Rehab - Patient scheduled orientation on 08/01/18 at 7:45am. Patient will attend the 6:45am exc class. Patient stated she may only be able to participate for a month as she plans to be back at work by the time she starts and she works for herself. Mailed packet.

## 2018-06-26 NOTE — Telephone Encounter (Signed)
Patient called back and stated she is going to attend Rehab at [facility] - Closed referral.

## 2018-07-01 ENCOUNTER — Encounter: Payer: Medicare Other | Admitting: *Deleted

## 2018-07-01 VITALS — BP 113/81 | HR 61 | Wt 225.0 lb

## 2018-07-01 DIAGNOSIS — Z006 Encounter for examination for normal comparison and control in clinical research program: Secondary | ICD-10-CM

## 2018-07-01 NOTE — Progress Notes (Signed)
[facility] Visit 6 Pt is doing well. No complaints of cp or sob. Pt saw Dr Lia Foyer in office today.  No changes in her meds.  Central labs drawn today.                                   "CONSENT"   YES     NO   Continuing further Investigational Product and study visits for follow-up? [x]  []   Continuing consent from future biomedical research [x]  []                                   "EVENTS"    YES     NO  AE   (IF YES SEE SOURCE) []  [x]   SAE  (IF YES SEE SOURCE) []  [x]   ENDPOINT   (IF YES SEE SOURCE) []  [x]   REVASCULARIZATION  (IF YES SEE SOURCE) []  [x]   AMPUTATION   (IF YES SEE SOURCE) []  [x]   TROPONIN'S  (IF YES SEE SOURCE) []  [x]         Current Outpatient Medications:  .  amiodarone (PACERONE) 200 MG tablet, Take 1 tablet (200 mg total) by mouth daily., Disp: 90 tablet, Rfl: 3 .  aspirin 81 MG chewable tablet, Chew 1 tablet (81 mg total) by mouth daily., Disp: , Rfl:  .  atorvastatin (LIPITOR) 80 MG tablet, Take 1 tablet (80 mg total) by mouth daily at 6 PM., Disp: 90 tablet, Rfl: 1 .  carvedilol (COREG) 6.25 MG tablet, Take 1 tablet (6.25 mg total) by mouth 2 (two) times daily., Disp: 180 tablet, Rfl: 3 .  lisinopril (PRINIVIL,ZESTRIL) 5 MG tablet, Take 1 tablet (5 mg total) by mouth daily., Disp: 30 tablet, Rfl: 2 .  metFORMIN (GLUCOPHAGE) 500 MG tablet, Take 1 tablet (500 mg total) by mouth 2 (two) times daily with a meal., Disp: 60 tablet, Rfl: 1 .  Multiple Vitamin (MULTIVITAMIN WITH MINERALS) TABS, Take 1 tablet by mouth daily., Disp: , Rfl:  .  nitroGLYCERIN (NITROSTAT) 0.4 MG SL tablet, Place 1 tablet (0.4 mg total) under the tongue every 5 (five) minutes as needed., Disp: 25 tablet, Rfl: 2 .  ticagrelor (BRILINTA) 90 MG TABS tablet, Take 1 tablet (90 mg total) by mouth 2 (two) times daily., Disp: 180 tablet, Rfl: 2 No current facility-administered medications for this visit.   Facility-Administered Medications Ordered in Other Visits:  .  0.9 %  sodium chloride infusion, , ,  Continuous PRN, End, Christopher, MD, Last Rate: 999 mL/hr at 05/31/18 2102, 999 mL/hr at 05/31/18 2102 .  amiodarone (NEXTERONE PREMIX) 360-4.14 MG/200ML-% (1.8 mg/mL) IV infusion, , , Continuous PRN, End, Christopher, MD, Last Rate: 33.3 mL/hr at 05/31/18 2130, 60 mg/hr at 05/31/18 2130 .  amiodarone (NEXTERONE) 1.8 mg/mL load via infusion, , , PRN, End, Christopher, MD, 150 mg at 05/31/18 2123 .  fentaNYL (SUBLIMAZE) injection, , , PRN, End, Christopher, MD, 25 mcg at 05/31/18 2126 .  heparin injection, , , PRN, End, Harrell Gave, MD, 5,000 Units at 05/31/18 2105 .  lidocaine (PF) (XYLOCAINE) 1 % injection, , , PRN, End, Christopher, MD, 2 mL at 05/31/18 2058 .  midazolam (VERSED) injection, , , PRN, End, Christopher, MD, 1 mg at 05/31/18 2126 .  Radial Cocktail/Verapamil only, , , PRN, End, Christopher, MD, 10 mL at 05/31/18 2059

## 2018-07-04 ENCOUNTER — Encounter: Payer: Self-pay | Admitting: *Deleted

## 2018-07-04 ENCOUNTER — Encounter: Payer: Medicare Other | Attending: Internal Medicine | Admitting: *Deleted

## 2018-07-04 VITALS — Ht 62.0 in | Wt 225.7 lb

## 2018-07-04 DIAGNOSIS — I255 Ischemic cardiomyopathy: Secondary | ICD-10-CM | POA: Diagnosis not present

## 2018-07-04 DIAGNOSIS — Z955 Presence of coronary angioplasty implant and graft: Secondary | ICD-10-CM | POA: Insufficient documentation

## 2018-07-04 DIAGNOSIS — I10 Essential (primary) hypertension: Secondary | ICD-10-CM | POA: Insufficient documentation

## 2018-07-04 DIAGNOSIS — I213 ST elevation (STEMI) myocardial infarction of unspecified site: Secondary | ICD-10-CM

## 2018-07-04 NOTE — Research (Signed)
Patient seen and evaluated.  She is in the CVICU.  Patient examined and the AEGIS II study explained to the patient.  All questions answered.  [place] Class I.  Lungs clear. Cardiac stable.

## 2018-07-04 NOTE — Progress Notes (Signed)
Cardiac Individual Treatment Plan  Patient Details  Name: Lindsey Pierce MRN: [id number] Date of Birth: 24-Jan-1951 Referring Provider:     Cardiac Rehab from 07/04/2018 in [facility] Cardiac and Pulmonary Rehab  Referring Provider  End      Initial Encounter Date:    Cardiac Rehab from 07/04/2018 in [facility] Cardiac and Pulmonary Rehab  Date  07/04/18      Visit Diagnosis: ST elevation myocardial infarction (STEMI), unspecified artery ([place])  Patient's Home Medications on Admission:  Current Outpatient Medications:  .  amiodarone (PACERONE) 200 MG tablet, Take 1 tablet (200 mg total) by mouth daily., Disp: 90 tablet, Rfl: 3 .  aspirin 81 MG chewable tablet, Chew 1 tablet (81 mg total) by mouth daily., Disp: , Rfl:  .  atorvastatin (LIPITOR) 80 MG tablet, Take 1 tablet (80 mg total) by mouth daily at 6 PM., Disp: 90 tablet, Rfl: 1 .  carvedilol (COREG) 6.25 MG tablet, Take 1 tablet (6.25 mg total) by mouth 2 (two) times daily., Disp: 180 tablet, Rfl: 3 .  lisinopril (PRINIVIL,ZESTRIL) 5 MG tablet, Take 1 tablet (5 mg total) by mouth daily., Disp: 30 tablet, Rfl: 2 .  metFORMIN (GLUCOPHAGE) 500 MG tablet, Take 1 tablet (500 mg total) by mouth 2 (two) times daily with a meal., Disp: 60 tablet, Rfl: 1 .  Multiple Vitamin (MULTIVITAMIN WITH MINERALS) TABS, Take 1 tablet by mouth daily., Disp: , Rfl:  .  nitroGLYCERIN (NITROSTAT) 0.4 MG SL tablet, Place 1 tablet (0.4 mg total) under the tongue every 5 (five) minutes as needed., Disp: 25 tablet, Rfl: 2 .  ticagrelor (BRILINTA) 90 MG TABS tablet, Take 1 tablet (90 mg total) by mouth 2 (two) times daily., Disp: 180 tablet, Rfl: 2 No current facility-administered medications for this visit.   Facility-Administered Medications Ordered in Other Visits:  .  0.9 %  sodium chloride infusion, , , Continuous PRN, End, Christopher, MD, Last Rate: 999 mL/hr at 05/31/18 2102, 999 mL/hr at 05/31/18 2102 .  amiodarone (NEXTERONE PREMIX) 360-4.14 MG/200ML-% (1.8 mg/mL)  IV infusion, , , Continuous PRN, End, Christopher, MD, Last Rate: 33.3 mL/hr at 05/31/18 2130, 60 mg/hr at 05/31/18 2130 .  amiodarone (NEXTERONE) 1.8 mg/mL load via infusion, , , PRN, End, Christopher, MD, 150 mg at 05/31/18 2123 .  fentaNYL (SUBLIMAZE) injection, , , PRN, End, Christopher, MD, 25 mcg at 05/31/18 2126 .  heparin injection, , , PRN, End, Harrell Gave, MD, 5,000 Units at 05/31/18 2105 .  lidocaine (PF) (XYLOCAINE) 1 % injection, , , PRN, End, Christopher, MD, 2 mL at 05/31/18 2058 .  midazolam (VERSED) injection, , , PRN, End, Christopher, MD, 1 mg at 05/31/18 2126 .  Radial Cocktail/Verapamil only, , , PRN, End, Christopher, MD, 10 mL at 05/31/18 2059  Past Medical History: Past Medical History:  Diagnosis Date  . Acute systolic (congestive) heart failure ([place])   . Diabetes ([place])   . Hyperlipidemia   . Hypertension   . PAF (paroxysmal atrial fibrillation) ([place])   . STEMI (ST elevation myocardial infarction) ([place])    06/01/18 PCI/DES x1 to the mLAD, IABP, EF 40-45%  . V-tach ([place])     Tobacco Use: Social History   Tobacco Use  Smoking Status Never Smoker  Smokeless Tobacco Never Used    Labs: Recent Review Flowsheet Data    Labs for ITP Cardiac and Pulmonary Rehab Latest Ref Rng & Units 05/31/2018 05/31/2018 05/31/2018 06/02/2018 06/04/2018   Cholestrol 0 - 200 mg/dL - 198 - - -  LDLCALC 0 - 99 mg/dL - 134(H) - - -   HDL >40 mg/dL - 30(L) - - -   Trlycerides <150 mg/dL - 170(H) - - -   Hemoglobin A1c 4.8 - 5.6 % - 7.3(H) - - 7.1(H)   PHART 7.350 - 7.450 - - 7.299(L) - -   PCO2ART 32.0 - 48.0 mmHg - - 48.3(H) - -   HCO3 20.0 - 28.0 mmol/L - - 23.7 - -   TCO2 22 - 32 mmol/L 23 - 25 - -   ACIDBASEDEF 0.0 - 2.0 mmol/L - - 3.0(H) - -   O2SAT % - - 97.0 62.2 -       Exercise Target Goals: Exercise Program Goal: Individual exercise prescription set using results from initial 6 min walk test and THRR while considering  patient's activity barriers and safety.    Exercise Prescription Goal: Initial exercise prescription builds to 30-45 minutes a day of aerobic activity, 2-3 days per week.  Home exercise guidelines will be given to patient during program as part of exercise prescription that the participant will acknowledge.  Activity Barriers & Risk Stratification: Activity Barriers & Cardiac Risk Stratification - 07/04/18 1312      Activity Barriers & Cardiac Risk Stratification   Activity Barriers  Other (comment)    Comments  inner ear issues, sometimes has issues bending down     Cardiac Risk Stratification  High       6 Minute Walk: 6 Minute Walk    Row Name 07/04/18 1436         6 Minute Walk   Distance  980 feet     Walk Time  6 minutes     # of Rest Breaks  0     MPH  1.85     METS  2.06     RPE  12     Perceived Dyspnea   0     VO2 Peak  7.22     Symptoms  No     Resting HR  64 bpm     Resting BP  114/62     Resting Oxygen Saturation   96 %     Exercise Oxygen Saturation  during 6 min walk  97 %     Max Ex. HR  115 bpm     Max Ex. BP  154/74     2 Minute Post BP  120/74        Oxygen Initial Assessment:   Oxygen Re-Evaluation:   Oxygen Discharge (Final Oxygen Re-Evaluation):   Initial Exercise Prescription: Initial Exercise Prescription - 07/04/18 1400      Date of Initial Exercise RX and Referring Provider   Date  07/04/18    Referring Provider  End      Treadmill   MPH  1.5    Grade  0    Minutes  15    METs  2.15      Recumbant Bike   Level  1    RPM  60    Minutes  15    METs  2      NuStep   Level  2    SPM  80    Minutes  15    METs  2      Prescription Details   Frequency (times per week)  3    Duration  Progress to 45 minutes of aerobic exercise without signs/symptoms of physical distress      Intensity  THRR 40-80% of Max Heartrate  99-135    Ratings of Perceived Exertion  11-13    Perceived Dyspnea  0-4      Resistance Training   Training Prescription  Yes    Weight  3  lb    Reps  10-15       Perform Capillary Blood Glucose checks as needed.  Exercise Prescription Changes: Exercise Prescription Changes    Row Name 07/04/18 1300             Response to Exercise   Blood Pressure (Admit)  114/62       Blood Pressure (Exercise)  154/74       Blood Pressure (Exit)  120/74       Heart Rate (Admit)  66 bpm       Heart Rate (Exercise)  115 bpm       Heart Rate (Exit)  90 bpm       Oxygen Saturation (Admit)  96 %       Oxygen Saturation (Exercise)  96 %       Rating of Perceived Exertion (Exercise)  12       Perceived Dyspnea (Exercise)  0          Exercise Comments:   Exercise Goals and Review: Exercise Goals    Row Name 07/04/18 1434             Exercise Goals   Increase Physical Activity  Yes       Intervention  Provide advice, education, support and counseling about physical activity/exercise needs.;Develop an individualized exercise prescription for aerobic and resistive training based on initial evaluation findings, risk stratification, comorbidities and participant's personal goals.       Expected Outcomes  Short Term: Attend rehab on a regular basis to increase amount of physical activity.;Long Term: Add in home exercise to make exercise part of routine and to increase amount of physical activity.;Long Term: Exercising regularly at least 3-5 days a week.       Increase Strength and Stamina  Yes       Intervention  Provide advice, education, support and counseling about physical activity/exercise needs.;Develop an individualized exercise prescription for aerobic and resistive training based on initial evaluation findings, risk stratification, comorbidities and participant's personal goals.       Expected Outcomes  Short Term: Increase workloads from initial exercise prescription for resistance, speed, and METs.;Short Term: Perform resistance training exercises routinely during rehab and add in resistance training at home;Long Term:  Improve cardiorespiratory fitness, muscular endurance and strength as measured by increased METs and functional capacity (6MWT)       Able to understand and use rate of perceived exertion (RPE) scale  Yes       Intervention  Provide education and explanation on how to use RPE scale       Expected Outcomes  Short Term: Able to use RPE daily in rehab to express subjective intensity level;Long Term:  Able to use RPE to guide intensity level when exercising independently       Able to understand and use Dyspnea scale  Yes       Intervention  Provide education and explanation on how to use Dyspnea scale       Expected Outcomes  Short Term: Able to use Dyspnea scale daily in rehab to express subjective sense of shortness of breath during exertion;Long Term: Able to use Dyspnea scale to guide intensity level when exercising independently  Knowledge and understanding of Target Heart Rate Range (THRR)  Yes       Intervention  Provide education and explanation of THRR including how the numbers were predicted and where they are located for reference       Expected Outcomes  Short Term: Able to state/look up THRR;Short Term: Able to use daily as guideline for intensity in rehab;Long Term: Able to use THRR to govern intensity when exercising independently       Able to check pulse independently  Yes       Intervention  Provide education and demonstration on how to check pulse in carotid and radial arteries.;Review the importance of being able to check your own pulse for safety during independent exercise       Expected Outcomes  Short Term: Able to explain why pulse checking is important during independent exercise;Long Term: Able to check pulse independently and accurately       Understanding of Exercise Prescription  Yes       Intervention  Provide education, explanation, and written materials on patient's individual exercise prescription       Expected Outcomes  Short Term: Able to explain program exercise  prescription;Long Term: Able to explain home exercise prescription to exercise independently          Exercise Goals Re-Evaluation :   Discharge Exercise Prescription (Final Exercise Prescription Changes): Exercise Prescription Changes - 07/04/18 1300      Response to Exercise   Blood Pressure (Admit)  114/62    Blood Pressure (Exercise)  154/74    Blood Pressure (Exit)  120/74    Heart Rate (Admit)  66 bpm    Heart Rate (Exercise)  115 bpm    Heart Rate (Exit)  90 bpm    Oxygen Saturation (Admit)  96 %    Oxygen Saturation (Exercise)  96 %    Rating of Perceived Exertion (Exercise)  12    Perceived Dyspnea (Exercise)  0       Nutrition:  Target Goals: Understanding of nutrition guidelines, daily intake of sodium '1500mg'$ , cholesterol '200mg'$ , calories 30% from fat and 7% or less from saturated fats, daily to have 5 or more servings of fruits and vegetables.  Biometrics: Pre Biometrics - 07/04/18 1433      Pre Biometrics   Height  '5\' 2"'$  (1.575 m)    Weight  225 lb 11.2 oz (102.4 kg)    Waist Circumference  44 inches    Hip Circumference  55.25 inches    Waist to Hip Ratio  0.8 %    BMI (Calculated)  41.27    Single Leg Stand  1.5 seconds        Nutrition Therapy Plan and Nutrition Goals: Nutrition Therapy & Goals - 07/04/18 1312      Intervention Plan   Intervention  Prescribe, educate and counsel regarding individualized specific dietary modifications aiming towards targeted core components such as weight, hypertension, lipid management, diabetes, heart failure and other comorbidities.;Nutrition handout(s) given to patient.    Expected Outcomes  Short Term Goal: A plan has been developed with personal nutrition goals set during dietitian appointment.;Short Term Goal: Understand basic principles of dietary content, such as calories, fat, sodium, cholesterol and nutrients.;Long Term Goal: Adherence to prescribed nutrition plan.       Nutrition Assessments: Nutrition  Assessments - 07/04/18 1312      MEDFICTS Scores   Pre Score  6       Nutrition Goals Re-Evaluation:   Nutrition  Goals Discharge (Final Nutrition Goals Re-Evaluation):   Psychosocial: Target Goals: Acknowledge presence or absence of significant depression and/or stress, maximize coping skills, provide positive support system. Participant is able to verbalize types and ability to use techniques and skills needed for reducing stress and depression.   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 She has anxiety about her heart and how she will feel while exercising. She has started walking at home. She feels like this is her "second chance at life" after her heart attack and is trying to make changes. She is very motivated to begin this "heart healthy lifestyle" and reported feeling a lot more confident in herself after orientation.       Family Dynamics   Good Support System?  Yes   Family, Friends, [employer] group, neighbors     Barriers   Psychosocial barriers to participate in program  There are no identifiable barriers or psychosocial needs.      Screening Interventions   Interventions  Encouraged to exercise;Program counselor consult;To provide support and resources with identified psychosocial needs;Provide feedback about the scores to participant    Expected Outcomes  Short Term goal: Utilizing psychosocial counselor, staff and physician to assist with identification of specific Stressors or current issues interfering with healing process. Setting desired goal for each stressor or current issue identified.;Long Term Goal: Stressors or current issues are controlled or eliminated.;Short Term goal: Identification  and review with participant of any Quality of Life or Depression concerns found by scoring the questionnaire.;Long Term goal: The participant improves quality of Life and PHQ9 Scores as seen by post scores and/or verbalization of changes       Quality of Life Scores:  Quality of Life - 07/04/18 1311      Quality of Life   Select  Quality of Life      Quality of Life Scores   Health/Function Pre  26.79 %    Socioeconomic Pre  24.5 %    Psych/Spiritual Pre  28.57 %    Family Pre  28.5 %    GLOBAL Pre  26.97 %      Scores of 19 and below usually indicate a poorer quality of life in these areas.  A difference of  2-3 points is a clinically meaningful difference.  A difference of 2-3 points in the total score of the Quality of Life Index has been associated with significant improvement in overall quality of life, self-image, physical symptoms, and general health in studies assessing change in quality of life.  PHQ-9: Recent Review Flowsheet Data    Depression screen [facility] 2/9 07/04/2018   Decreased Interest 0   Down, Depressed, Hopeless 0   PHQ - 2 Score 0   Altered sleeping 1   Tired, decreased energy 1   Change in appetite 0   Feeling bad or failure about yourself  0   Trouble concentrating 0   Moving slowly or fidgety/restless 0   Suicidal thoughts 0   PHQ-9 Score 2   Difficult doing work/chores Not difficult at all     Interpretation of Total Score  Total Score Depression Severity:  1-4 = Minimal depression, 5-9 = Mild depression, 10-14 =  Moderate depression, 15-19 = Moderately severe depression, 20-27 = Severe depression   Psychosocial Evaluation and Intervention:   Psychosocial Re-Evaluation:   Psychosocial Discharge (Final Psychosocial Re-Evaluation):   Vocational Rehabilitation: Provide vocational rehab assistance to qualifying candidates.   Vocational Rehab Evaluation & Intervention: Vocational Rehab - 07/04/18 1308      Initial Vocational Rehab Evaluation &  Intervention   Assessment shows need for Vocational Rehabilitation  No       Education: Education Goals: Education classes will be provided on a variety of topics geared toward better understanding of heart health and risk factor modification. Participant will state understanding/return demonstration of topics presented as noted by education test scores.  Learning Barriers/Preferences: Learning Barriers/Preferences - 07/04/18 1307      Learning Barriers/Preferences   Learning Barriers  Sight   wears glasses   Learning Preferences  None       Education Topics:  AED/CPR: - Group verbal and written instruction with the use of models to demonstrate the basic use of the AED with the basic ABC's of resuscitation.   General Nutrition Guidelines/Fats and Fiber: -Group instruction provided by verbal, written material, models and posters to present the general guidelines for heart healthy nutrition. Gives an explanation and review of dietary fats and fiber.   Controlling Sodium/Reading Food Labels: -Group verbal and written material supporting the discussion of sodium use in heart healthy nutrition. Review and explanation with models, verbal and written materials for utilization of the food label.   Exercise Physiology & General Exercise Guidelines: - Group verbal and written instruction with models to review the exercise physiology of the cardiovascular system and associated critical values. Provides general exercise guidelines with specific guidelines to those with heart or lung disease.    Aerobic Exercise & Resistance Training: - Gives group verbal and written instruction on the various components of exercise. Focuses on aerobic and resistive training programs and the benefits of this training and how to safely progress through these programs..   Flexibility, Balance, Mind/Body Relaxation: Provides group verbal/written instruction on the benefits of flexibility and balance  training, including mind/body exercise modes such as yoga, pilates and tai chi.  Demonstration and skill practice provided.   Stress and Anxiety: - Provides group verbal and written instruction about the health risks of elevated stress and causes of high stress.  Discuss the correlation between heart/lung disease and anxiety and treatment options. Review healthy ways to manage with stress and anxiety.   Depression: - Provides group verbal and written instruction on the correlation between heart/lung disease and depressed mood, treatment options, and the stigmas associated with seeking treatment.   Anatomy & Physiology of the Heart: - Group verbal and written instruction and models provide basic cardiac anatomy and physiology, with the coronary electrical and arterial systems. Review of Valvular disease and Heart Failure   Cardiac Procedures: - Group verbal and written instruction to review commonly prescribed medications for heart disease. Reviews the medication, class of the drug, and side effects. Includes the steps to properly store meds and maintain the prescription regimen. (beta blockers and nitrates)   Cardiac Medications I: - Group verbal and written instruction to review commonly prescribed medications for heart disease. Reviews the medication, class of the drug, and side effects. Includes the steps to properly store meds and maintain the prescription regimen.   Cardiac Medications II: -Group verbal and written instruction to review commonly prescribed medications for heart disease. Reviews the medication, class of the drug, and side effects. (all other  drug classes)    Go Sex-Intimacy & Heart Disease, Get SMART - Goal Setting: - Group verbal and written instruction through game format to discuss heart disease and the return to sexual intimacy. Provides group verbal and written material to discuss and apply goal setting through the application of the S.M.A.R.T.  Method.   Other Matters of the Heart: - Provides group verbal, written materials and models to describe Stable Angina and Peripheral Artery. Includes description of the disease process and treatment options available to the cardiac patient.   Exercise & Equipment Safety: - Individual verbal instruction and demonstration of equipment use and safety with use of the equipment.   Cardiac Rehab from 07/04/2018 in [facility] Cardiac and Pulmonary Rehab  Date  07/04/18  Educator  [facility]  Instruction Review Code  1- Verbalizes Understanding      Infection Prevention: - Provides verbal and written material to individual with discussion of infection control including proper hand washing and proper equipment cleaning during exercise session.   Cardiac Rehab from 07/04/2018 in [facility] Cardiac and Pulmonary Rehab  Date  07/04/18  Educator  [facility]  Instruction Review Code  1- Verbalizes Understanding      Falls Prevention: - Provides verbal and written material to individual with discussion of falls prevention and safety.   Cardiac Rehab from 07/04/2018 in [facility] Cardiac and Pulmonary Rehab  Date  07/04/18  Educator  [facility]  Instruction Review Code  1- Verbalizes Understanding      Diabetes: - Individual verbal and written instruction to review signs/symptoms of diabetes, desired ranges of glucose level fasting, after meals and with exercise. Acknowledge that pre and post exercise glucose checks will be done for 3 sessions at entry of program.   Cardiac Rehab from 07/04/2018 in [facility] Cardiac and Pulmonary Rehab  Date  07/04/18  Educator  [facility]  Instruction Review Code  1- Verbalizes Understanding      Know Your Numbers and Risk Factors: -Group verbal and written instruction about important numbers in your health.  Discussion of what are risk factors and how they play a role in the disease process.  Review of Cholesterol, Blood Pressure, Diabetes, and BMI and the role they play in your overall health.   Sleep  Hygiene: -Provides group verbal and written instruction about how sleep can affect your health.  Define sleep hygiene, discuss sleep cycles and impact of sleep habits. Review good sleep hygiene tips.    Other: -Provides group and verbal instruction on various topics (see comments)   Knowledge Questionnaire Score: Knowledge Questionnaire Score - 07/04/18 1307      Knowledge Questionnaire Score   Pre Score  22/26   correct answers reviewed with patient, focus on nutrition and A&P      Core Components/Risk Factors/Patient Goals at Admission: Personal Goals and Risk Factors at Admission - 07/04/18 1306      Core Components/Risk Factors/Patient Goals on Admission    Weight Management  Yes;Obesity;Weight Loss    Intervention  Weight Management: Develop a combined nutrition and exercise program designed to reach desired caloric intake, while maintaining appropriate intake of nutrient and fiber, sodium and fats, and appropriate energy expenditure required for the weight goal.;Weight Management: Provide education and appropriate resources to help participant work on and attain dietary goals.;Weight Management/Obesity: Establish reasonable short term and long term weight goals.;Obesity: Provide education and appropriate resources to help participant work on and attain dietary goals.    Admit Weight  225 lb (102.1 kg)    Goal Weight:  Short Term  220 lb (99.8 kg)    Goal Weight: Long Term  160 lb (72.6 kg)    Expected Outcomes  Short Term: Continue to assess and modify interventions until short term weight is achieved;Long Term: Adherence to nutrition and physical activity/exercise program aimed toward attainment of established weight goal;Weight Loss: Understanding of general recommendations for a balanced deficit meal plan, which promotes 1-2 lb weight loss per week and includes a negative energy balance of [phone] kcal/d;Understanding recommendations for meals to include 15-35% energy as protein,  25-35% energy from fat, 35-60% energy from carbohydrates, less than '200mg'$  of dietary cholesterol, 20-35 gm of total fiber daily;Understanding of distribution of calorie intake throughout the day with the consumption of 4-5 meals/snacks    Diabetes  Yes    Intervention  Provide education about signs/symptoms and action to take for hypo/hyperglycemia.;Provide education about proper nutrition, including hydration, and aerobic/resistive exercise prescription along with prescribed medications to achieve blood glucose in normal ranges: Fasting glucose 65-99 mg/dL    Expected Outcomes  Short Term: Participant verbalizes understanding of the signs/symptoms and immediate care of hyper/hypoglycemia, proper foot care and importance of medication, aerobic/resistive exercise and nutrition plan for blood glucose control.;Long Term: Attainment of HbA1C < 7%.    Hypertension  Yes    Intervention  Provide education on lifestyle modifcations including regular physical activity/exercise, weight management, moderate sodium restriction and increased consumption of fresh fruit, vegetables, and low fat dairy, alcohol moderation, and smoking cessation.;Monitor prescription use compliance.    Expected Outcomes  Short Term: Continued assessment and intervention until BP is < 140/68m HG in hypertensive participants. < 130/839mHG in hypertensive participants with diabetes, heart failure or chronic kidney disease.;Long Term: Maintenance of blood pressure at goal levels.    Lipids  Yes    Intervention  Provide education and support for participant on nutrition & aerobic/resistive exercise along with prescribed medications to achieve LDL '70mg'$ , HDL >'40mg'$ .    Expected Outcomes  Short Term: Participant states understanding of desired cholesterol values and is compliant with medications prescribed. Participant is following exercise prescription and nutrition guidelines.;Long Term: Cholesterol controlled with medications as prescribed,  with individualized exercise RX and with personalized nutrition plan. Value goals: LDL < '70mg'$ , HDL > 40 mg.       Core Components/Risk Factors/Patient Goals Review:    Core Components/Risk Factors/Patient Goals at Discharge (Final Review):    ITP Comments: ITP Comments    Row Name 07/04/18 1254           ITP Comments  Med Review completed. Initial ITP created. Diagnosis can be found in [facility] 8/8          Comments: Initial ITP

## 2018-07-04 NOTE — Progress Notes (Signed)
Daily Session Note  Patient Details  Name: Lindsey Pierce MRN: [id number] Date of Birth: 06-03-1951 Referring Provider:     Cardiac Rehab from 07/04/2018 in [facility] Cardiac and Pulmonary Rehab  Referring Provider  End      Encounter Date: 07/04/2018  Check In: Session Check In - 07/04/18 1253      Check-In   Supervising physician immediately available to respond to emergencies  See telemetry face sheet for immediately available ER MD    Location  [facility]-Cardiac & Pulmonary Rehab    Staff Present  Renita Papa, RN Vickki Hearing, BA, ACSM CEP, Exercise Physiologist    Tobacco Cessation  No Change   non smoker   Warm-up and Cool-down  Not performed (comment)   med review   Resistance Training Performed  Yes    VAD Patient?  No    PAD/SET Patient?  No      Pain Assessment   Currently in Pain?  No/denies        Exercise Prescription Changes - 07/04/18 1300      Response to Exercise   Blood Pressure (Admit)  114/62    Blood Pressure (Exercise)  154/74    Blood Pressure (Exit)  120/74    Heart Rate (Admit)  66 bpm    Heart Rate (Exercise)  115 bpm    Heart Rate (Exit)  90 bpm    Oxygen Saturation (Admit)  96 %    Oxygen Saturation (Exercise)  96 %    Rating of Perceived Exertion (Exercise)  12    Perceived Dyspnea (Exercise)  0       Social History   Tobacco Use  Smoking Status Never Smoker  Smokeless Tobacco Never Used    Goals Met:  Exercise tolerated well Personal goals reviewed No report of cardiac concerns or symptoms Strength training completed today  Goals Unmet:  Not Applicable  Comments: Med Review completed.   Dr. Emily Filbert is Medical Director for [place] and [facility].

## 2018-07-04 NOTE — Patient Instructions (Signed)
Patient Instructions  Patient Details  Name: Lindsey Pierce MRN: [id number] Date of Birth: Nov 13, 1951 Referring Provider:  [name], MD  Below are your personal goals for exercise, nutrition, and risk factors. Our goal is to help you stay on track towards obtaining and maintaining these goals. We will be discussing your progress on these goals with you throughout the program.  Initial Exercise Prescription: Initial Exercise Prescription - 07/04/18 1400      Date of Initial Exercise RX and Referring Provider   Date  07/04/18    Referring Provider  End      Treadmill   MPH  1.5    Grade  0    Minutes  15    METs  2.15      Recumbant Bike   Level  1    RPM  60    Minutes  15    METs  2      NuStep   Level  2    SPM  80    Minutes  15    METs  2      Prescription Details   Frequency (times per week)  3    Duration  Progress to 45 minutes of aerobic exercise without signs/symptoms of physical distress      Intensity   THRR 40-80% of Max Heartrate  99-135    Ratings of Perceived Exertion  11-13    Perceived Dyspnea  0-4      Resistance Training   Training Prescription  Yes    Weight  3 lb    Reps  10-15       Exercise Goals: Frequency: Be able to perform aerobic exercise two to three times per week in program working toward 2-5 days per week of home exercise.  Intensity: Work with a perceived exertion of 11 (fairly light) - 15 (hard) while following your exercise prescription.  We will make changes to your prescription with you as you progress through the program.   Duration: Be able to do 30 to 45 minutes of continuous aerobic exercise in addition to a 5 minute warm-up and a 5 minute cool-down routine.   Nutrition Goals: Your personal nutrition goals will be established when you do your nutrition analysis with the dietician.  The following are general nutrition guidelines to follow: Cholesterol < 200mg /day Sodium < 1500mg /day Fiber: Women over 50 yrs - 21  grams per day  Personal Goals: Personal Goals and Risk Factors at Admission - 07/04/18 1306      Core Components/Risk Factors/Patient Goals on Admission    Weight Management  Yes;Obesity;Weight Loss    Intervention  Weight Management: Develop a combined nutrition and exercise program designed to reach desired caloric intake, while maintaining appropriate intake of nutrient and fiber, sodium and fats, and appropriate energy expenditure required for the weight goal.;Weight Management: Provide education and appropriate resources to help participant work on and attain dietary goals.;Weight Management/Obesity: Establish reasonable short term and long term weight goals.;Obesity: Provide education and appropriate resources to help participant work on and attain dietary goals.    Admit Weight  225 lb (102.1 kg)    Goal Weight: Short Term  220 lb (99.8 kg)    Goal Weight: Long Term  160 lb (72.6 kg)    Expected Outcomes  Short Term: Continue to assess and modify interventions until short term weight is achieved;Long Term: Adherence to nutrition and physical activity/exercise program aimed toward attainment of established weight goal;Weight Loss: Understanding of general recommendations  for a balanced deficit meal plan, which promotes 1-2 lb weight loss per week and includes a negative energy balance of [phone] kcal/d;Understanding recommendations for meals to include 15-35% energy as protein, 25-35% energy from fat, 35-60% energy from carbohydrates, less than 200mg  of dietary cholesterol, 20-35 gm of total fiber daily;Understanding of distribution of calorie intake throughout the day with the consumption of 4-5 meals/snacks    Diabetes  Yes    Intervention  Provide education about signs/symptoms and action to take for hypo/hyperglycemia.;Provide education about proper nutrition, including hydration, and aerobic/resistive exercise prescription along with prescribed medications to achieve blood glucose in normal  ranges: Fasting glucose 65-99 mg/dL    Expected Outcomes  Short Term: Participant verbalizes understanding of the signs/symptoms and immediate care of hyper/hypoglycemia, proper foot care and importance of medication, aerobic/resistive exercise and nutrition plan for blood glucose control.;Long Term: Attainment of HbA1C < 7%.    Hypertension  Yes    Intervention  Provide education on lifestyle modifcations including regular physical activity/exercise, weight management, moderate sodium restriction and increased consumption of fresh fruit, vegetables, and low fat dairy, alcohol moderation, and smoking cessation.;Monitor prescription use compliance.    Expected Outcomes  Short Term: Continued assessment and intervention until BP is < 140/32mm HG in hypertensive participants. < 130/92mm HG in hypertensive participants with diabetes, heart failure or chronic kidney disease.;Long Term: Maintenance of blood pressure at goal levels.    Lipids  Yes    Intervention  Provide education and support for participant on nutrition & aerobic/resistive exercise along with prescribed medications to achieve LDL 70mg , HDL >40mg .    Expected Outcomes  Short Term: Participant states understanding of desired cholesterol values and is compliant with medications prescribed. Participant is following exercise prescription and nutrition guidelines.;Long Term: Cholesterol controlled with medications as prescribed, with individualized exercise RX and with personalized nutrition plan. Value goals: LDL < 70mg , HDL > 40 mg.       Tobacco Use Initial Evaluation: Social History   Tobacco Use  Smoking Status Never Smoker  Smokeless Tobacco Never Used    Exercise Goals and Review: Exercise Goals    Row Name 07/04/18 1434             Exercise Goals   Increase Physical Activity  Yes       Intervention  Provide advice, education, support and counseling about physical activity/exercise needs.;Develop an individualized exercise  prescription for aerobic and resistive training based on initial evaluation findings, risk stratification, comorbidities and participant's personal goals.       Expected Outcomes  Short Term: Attend rehab on a regular basis to increase amount of physical activity.;Long Term: Add in home exercise to make exercise part of routine and to increase amount of physical activity.;Long Term: Exercising regularly at least 3-5 days a week.       Increase Strength and Stamina  Yes       Intervention  Provide advice, education, support and counseling about physical activity/exercise needs.;Develop an individualized exercise prescription for aerobic and resistive training based on initial evaluation findings, risk stratification, comorbidities and participant's personal goals.       Expected Outcomes  Short Term: Increase workloads from initial exercise prescription for resistance, speed, and METs.;Short Term: Perform resistance training exercises routinely during rehab and add in resistance training at home;Long Term: Improve cardiorespiratory fitness, muscular endurance and strength as measured by increased METs and functional capacity (6MWT)       Able to understand and use rate of perceived exertion (  RPE) scale  Yes       Intervention  Provide education and explanation on how to use RPE scale       Expected Outcomes  Short Term: Able to use RPE daily in rehab to express subjective intensity level;Long Term:  Able to use RPE to guide intensity level when exercising independently       Able to understand and use Dyspnea scale  Yes       Intervention  Provide education and explanation on how to use Dyspnea scale       Expected Outcomes  Short Term: Able to use Dyspnea scale daily in rehab to express subjective sense of shortness of breath during exertion;Long Term: Able to use Dyspnea scale to guide intensity level when exercising independently       Knowledge and understanding of Target Heart Rate Range (THRR)  Yes        Intervention  Provide education and explanation of THRR including how the numbers were predicted and where they are located for reference       Expected Outcomes  Short Term: Able to state/look up THRR;Short Term: Able to use daily as guideline for intensity in rehab;Long Term: Able to use THRR to govern intensity when exercising independently       Able to check pulse independently  Yes       Intervention  Provide education and demonstration on how to check pulse in carotid and radial arteries.;Review the importance of being able to check your own pulse for safety during independent exercise       Expected Outcomes  Short Term: Able to explain why pulse checking is important during independent exercise;Long Term: Able to check pulse independently and accurately       Understanding of Exercise Prescription  Yes       Intervention  Provide education, explanation, and written materials on patient's individual exercise prescription       Expected Outcomes  Short Term: Able to explain program exercise prescription;Long Term: Able to explain home exercise prescription to exercise independently          Copy of goals given to participant.

## 2018-07-04 NOTE — Progress Notes (Signed)
Patient seen following completion of AEGIS II investigation. She is stable.  No complaints.  Exam is unremarkable.

## 2018-07-08 ENCOUNTER — Encounter: Payer: Medicare Other | Admitting: *Deleted

## 2018-07-08 DIAGNOSIS — I10 Essential (primary) hypertension: Secondary | ICD-10-CM | POA: Diagnosis not present

## 2018-07-08 DIAGNOSIS — Z955 Presence of coronary angioplasty implant and graft: Secondary | ICD-10-CM | POA: Diagnosis not present

## 2018-07-08 DIAGNOSIS — I213 ST elevation (STEMI) myocardial infarction of unspecified site: Secondary | ICD-10-CM

## 2018-07-08 DIAGNOSIS — I255 Ischemic cardiomyopathy: Secondary | ICD-10-CM | POA: Diagnosis not present

## 2018-07-08 LAB — GLUCOSE, CAPILLARY
Glucose-Capillary: 149 mg/dL — ABNORMAL HIGH (ref 70–99)
Glucose-Capillary: 126 mg/dL — ABNORMAL HIGH (ref 70–99)

## 2018-07-08 NOTE — Research (Signed)
PK

## 2018-07-08 NOTE — Progress Notes (Signed)
Daily Session Note  Patient Details  Name: Lindsey Pierce MRN: [id number] Date of Birth: 03-Jun-1951 Referring Provider:     Cardiac Rehab from 07/04/2018 in [facility] Cardiac and Pulmonary Rehab  Referring Provider  End      Encounter Date: 07/08/2018  Check In: Session Check In - 07/08/18 0756      Check-In   Supervising physician immediately available to respond to emergencies  See telemetry face sheet for immediately available ER MD    Location  [facility]-Cardiac & Pulmonary Rehab    Staff Present  Earlean Shawl, BS, ACSM CEP, Exercise Physiologist;Jessica Luan Pulling, MA, RCEP, CCRP, Exercise Physiologist;Susanne Bice, RN, BSN, CCRP    Medication changes reported      No    Fall or balance concerns reported     No    Tobacco Cessation  No Change    Warm-up and Cool-down  Performed on first and last piece of equipment    Resistance Training Performed  Yes    VAD Patient?  No    PAD/SET Patient?  No      Pain Assessment   Currently in Pain?  No/denies    Multiple Pain Sites  No          Social History   Tobacco Use  Smoking Status Never Smoker  Smokeless Tobacco Never Used    Goals Met:  Exercise tolerated well Personal goals reviewed No report of cardiac concerns or symptoms Strength training completed today  Goals Unmet:  Not Applicable  Comments: First full day of exercise!  Patient was oriented to gym and equipment including functions, settings, policies, and procedures.  Patient's individual exercise prescription and treatment plan were reviewed.  All starting workloads were established based on the results of the 6 minute walk test done at initial orientation visit.  The plan for exercise progression was also introduced and progression will be customized based on patient's performance and goals.     Dr. Emily Filbert is Medical Director for [place] and [facility].

## 2018-07-10 DIAGNOSIS — Z955 Presence of coronary angioplasty implant and graft: Secondary | ICD-10-CM | POA: Diagnosis not present

## 2018-07-10 DIAGNOSIS — I213 ST elevation (STEMI) myocardial infarction of unspecified site: Secondary | ICD-10-CM

## 2018-07-10 DIAGNOSIS — I10 Essential (primary) hypertension: Secondary | ICD-10-CM | POA: Diagnosis not present

## 2018-07-10 DIAGNOSIS — I255 Ischemic cardiomyopathy: Secondary | ICD-10-CM | POA: Diagnosis not present

## 2018-07-10 LAB — GLUCOSE, CAPILLARY
GLUCOSE-CAPILLARY: 128 mg/dL — AB (ref 70–99)
GLUCOSE-CAPILLARY: 120 mg/dL — AB (ref 70–99)

## 2018-07-10 NOTE — Progress Notes (Signed)
Daily Session Note  Patient Details  Name: Lindsey Pierce MRN: [id number] Date of Birth: July 12, 1951 Referring Provider:     Cardiac Rehab from 07/04/2018 in [facility] Cardiac and Pulmonary Rehab  Referring Provider  End      Encounter Date: 07/10/2018  Check In: Session Check In - 07/10/18 0741      Check-In   Supervising physician immediately available to respond to emergencies  See telemetry face sheet for immediately available ER MD    Location  [facility]-Cardiac & Pulmonary Rehab    Staff Present  Justin Mend Lorre Nick, MA, RCEP, CCRP, Exercise Physiologist;Krista Frederico Hamman, RN BSN    Medication changes reported      No    Fall or balance concerns reported     No    Warm-up and Cool-down  Performed as group-led Higher education careers adviser Performed  Yes    VAD Patient?  No      Pain Assessment   Currently in Pain?  No/denies          Social History   Tobacco Use  Smoking Status Never Smoker  Smokeless Tobacco Never Used    Goals Met:  Independence with exercise equipment Exercise tolerated well No report of cardiac concerns or symptoms Strength training completed today  Goals Unmet:  Not Applicable  Comments: Pt able to follow exercise prescription today without complaint.  Will continue to monitor for progression.   Dr. Emily Filbert is Medical Director for [place] and [facility].

## 2018-07-12 ENCOUNTER — Encounter: Payer: Medicare Other | Admitting: *Deleted

## 2018-07-12 DIAGNOSIS — Z955 Presence of coronary angioplasty implant and graft: Secondary | ICD-10-CM | POA: Diagnosis not present

## 2018-07-12 DIAGNOSIS — I10 Essential (primary) hypertension: Secondary | ICD-10-CM | POA: Diagnosis not present

## 2018-07-12 DIAGNOSIS — I255 Ischemic cardiomyopathy: Secondary | ICD-10-CM | POA: Diagnosis not present

## 2018-07-12 DIAGNOSIS — I213 ST elevation (STEMI) myocardial infarction of unspecified site: Secondary | ICD-10-CM

## 2018-07-12 LAB — GLUCOSE, CAPILLARY
Glucose-Capillary: 144 mg/dL — ABNORMAL HIGH (ref 70–99)
Glucose-Capillary: 158 mg/dL — ABNORMAL HIGH (ref 70–99)

## 2018-07-12 NOTE — Progress Notes (Signed)
Daily Session Note  Patient Details  Name: Lindsey Pierce MRN: [id number] Date of Birth: 05/03/1951 Referring Provider:     Cardiac Rehab from 07/04/2018 in [facility] Cardiac and Pulmonary Rehab  Referring Provider  End      Encounter Date: 07/12/2018  Check In: Session Check In - 07/12/18 0807      Check-In   Supervising physician immediately available to respond to emergencies  See telemetry face sheet for immediately available ER MD    Location  [facility]-Cardiac & Pulmonary Rehab    Staff Present  Nyoka Cowden, RN, BSN, Willette Pa, MA, RCEP, CCRP, Exercise Physiologist;Amanda Oletta Darter, [place], ACSM CEP, Exercise Physiologist    Medication changes reported      No    Fall or balance concerns reported     No    Warm-up and Cool-down  Performed on first and last piece of equipment    Resistance Training Performed  Yes    VAD Patient?  No    PAD/SET Patient?  No      Pain Assessment   Currently in Pain?  No/denies          Social History   Tobacco Use  Smoking Status Never Smoker  Smokeless Tobacco Never Used    Goals Met:  Independence with exercise equipment Exercise tolerated well No report of cardiac concerns or symptoms Strength training completed today  Goals Unmet:  Not Applicable  Comments: Pt able to follow exercise prescription today without complaint.  Will continue to monitor for progression.  Reviewed home exercise with pt today.  Pt plans to walk at home for exercise.  Reviewed THR, pulse, RPE, sign and symptoms, NTG use, and when to call 911 or MD.  Also discussed weather considerations and indoor options.  Pt voiced understanding.   Dr. Emily Filbert is Medical Director for [place] and [facility].

## 2018-07-17 ENCOUNTER — Encounter: Payer: Medicare Other | Attending: Cardiovascular Disease

## 2018-07-17 DIAGNOSIS — I10 Essential (primary) hypertension: Secondary | ICD-10-CM | POA: Insufficient documentation

## 2018-07-17 DIAGNOSIS — Z955 Presence of coronary angioplasty implant and graft: Secondary | ICD-10-CM | POA: Insufficient documentation

## 2018-07-17 DIAGNOSIS — I255 Ischemic cardiomyopathy: Secondary | ICD-10-CM | POA: Insufficient documentation

## 2018-07-19 DIAGNOSIS — I10 Essential (primary) hypertension: Secondary | ICD-10-CM | POA: Diagnosis not present

## 2018-07-19 DIAGNOSIS — I213 ST elevation (STEMI) myocardial infarction of unspecified site: Secondary | ICD-10-CM

## 2018-07-19 DIAGNOSIS — Z955 Presence of coronary angioplasty implant and graft: Secondary | ICD-10-CM | POA: Diagnosis not present

## 2018-07-19 DIAGNOSIS — I255 Ischemic cardiomyopathy: Secondary | ICD-10-CM | POA: Diagnosis not present

## 2018-07-19 NOTE — Progress Notes (Signed)
Daily Session Note  Patient Details  Name: Lindsey Pierce MRN: [id number] Date of Birth: 12/30/1950 Referring Provider:     Cardiac Rehab from 07/04/2018 in [facility] Cardiac and Pulmonary Rehab  Referring Provider  End      Encounter Date: 07/19/2018  Check In:      Social History   Tobacco Use  Smoking Status Never Smoker  Smokeless Tobacco Never Used    Goals Met:  Independence with exercise equipment Exercise tolerated well No report of cardiac concerns or symptoms Strength training completed today  Goals Unmet:  Not Applicable  Comments: Pt able to follow exercise prescription today without complaint.  Will continue to monitor for progression.    Dr. Emily Filbert is Medical Director for [place] and [facility].

## 2018-07-22 ENCOUNTER — Encounter: Payer: Medicare Other | Admitting: *Deleted

## 2018-07-22 DIAGNOSIS — I255 Ischemic cardiomyopathy: Secondary | ICD-10-CM | POA: Diagnosis not present

## 2018-07-22 DIAGNOSIS — I10 Essential (primary) hypertension: Secondary | ICD-10-CM | POA: Diagnosis not present

## 2018-07-22 DIAGNOSIS — I213 ST elevation (STEMI) myocardial infarction of unspecified site: Secondary | ICD-10-CM

## 2018-07-22 DIAGNOSIS — Z955 Presence of coronary angioplasty implant and graft: Secondary | ICD-10-CM | POA: Diagnosis not present

## 2018-07-22 NOTE — Progress Notes (Signed)
Daily Session Note  Patient Details  Name: Lindsey Pierce MRN: [id number] Date of Birth: Apr 10, 1951 Referring Provider:     Cardiac Rehab from 07/04/2018 in [facility] Cardiac and Pulmonary Rehab  Referring Provider  End      Encounter Date: 07/22/2018  Check In: Session Check In - 07/22/18 0840      Check-In   Supervising physician immediately available to respond to emergencies  See telemetry face sheet for immediately available ER MD    Location  [facility]-Cardiac & Pulmonary Rehab    Staff Present  Earlean Shawl, BS, ACSM CEP, Exercise Physiologist;Jessica Luan Pulling, MA, RCEP, CCRP, Exercise Physiologist;Susanne Bice, RN, BSN, CCRP    Medication changes reported      No    Fall or balance concerns reported     No    Tobacco Cessation  No Change    Warm-up and Cool-down  Performed on first and last piece of equipment    Resistance Training Performed  Yes    VAD Patient?  No    PAD/SET Patient?  No      Pain Assessment   Currently in Pain?  No/denies    Multiple Pain Sites  No          Social History   Tobacco Use  Smoking Status Never Smoker  Smokeless Tobacco Never Used    Goals Met:  Independence with exercise equipment Exercise tolerated well No report of cardiac concerns or symptoms Strength training completed today  Goals Unmet:  Not Applicable  Comments: Pt able to follow exercise prescription today without complaint.  Will continue to monitor for progression.    Dr. Emily Filbert is Medical Director for [place] and [facility].

## 2018-07-23 ENCOUNTER — Encounter: Payer: Self-pay | Admitting: *Deleted

## 2018-07-23 DIAGNOSIS — Z006 Encounter for examination for normal comparison and control in clinical research program: Secondary | ICD-10-CM

## 2018-07-23 DIAGNOSIS — H04123 Dry eye syndrome of bilateral lacrimal glands: Secondary | ICD-10-CM | POA: Diagnosis not present

## 2018-07-24 ENCOUNTER — Encounter: Payer: Self-pay | Admitting: *Deleted

## 2018-07-24 ENCOUNTER — Ambulatory Visit (INDEPENDENT_AMBULATORY_CARE_PROVIDER_SITE_OTHER): Payer: Medicare Other | Admitting: Nurse Practitioner

## 2018-07-24 ENCOUNTER — Encounter: Payer: Self-pay | Admitting: Nurse Practitioner

## 2018-07-24 VITALS — BP 130/88 | HR 64 | Ht 61.0 in | Wt 222.0 lb

## 2018-07-24 DIAGNOSIS — I472 Ventricular tachycardia, unspecified: Secondary | ICD-10-CM

## 2018-07-24 DIAGNOSIS — I2510 Atherosclerotic heart disease of native coronary artery without angina pectoris: Secondary | ICD-10-CM

## 2018-07-24 DIAGNOSIS — I213 ST elevation (STEMI) myocardial infarction of unspecified site: Secondary | ICD-10-CM

## 2018-07-24 DIAGNOSIS — E785 Hyperlipidemia, unspecified: Secondary | ICD-10-CM

## 2018-07-24 DIAGNOSIS — I255 Ischemic cardiomyopathy: Secondary | ICD-10-CM | POA: Diagnosis not present

## 2018-07-24 DIAGNOSIS — I10 Essential (primary) hypertension: Secondary | ICD-10-CM | POA: Diagnosis not present

## 2018-07-24 DIAGNOSIS — I480 Paroxysmal atrial fibrillation: Secondary | ICD-10-CM | POA: Diagnosis not present

## 2018-07-24 DIAGNOSIS — Z955 Presence of coronary angioplasty implant and graft: Secondary | ICD-10-CM | POA: Diagnosis not present

## 2018-07-24 NOTE — Progress Notes (Signed)
Daily Session Note  Patient Details  Name: Lindsey Pierce MRN: [id number] Date of Birth: [birth year] Referring Provider:     Cardiac Rehab from 07/04/2018 in [facility] Cardiac and Pulmonary Rehab  Referring Provider  End      Encounter Date: 07/24/2018  Check In: Session Check In - 07/24/18 0727      Check-In   Supervising physician immediately available to respond to emergencies  See telemetry face sheet for immediately available ER MD    Location  [facility]-Cardiac & Pulmonary Rehab    Staff Present  Alberteen Sam, MA, RCEP, CCRP, Exercise Physiologist;Gabe Glace [place];Heath Lark, RN, BSN, CCRP    Medication changes reported      No    Fall or balance concerns reported     No    Warm-up and Cool-down  Performed on first and last piece of equipment    Resistance Training Performed  Yes    VAD Patient?  No      Pain Assessment   Currently in Pain?  No/denies          Social History   Tobacco Use  Smoking Status Never Smoker  Smokeless Tobacco Never Used    Goals Met:  Independence with exercise equipment Exercise tolerated well No report of cardiac concerns or symptoms Strength training completed today  Goals Unmet:  Not Applicable  Comments: Pt able to follow exercise prescription today without complaint.  Will continue to monitor for progression.   Dr. Emily Filbert is Medical Director for [place] and [facility].

## 2018-07-24 NOTE — Progress Notes (Signed)
Cardiac Individual Treatment Plan  Patient Details  Name: Lindsey Pierce MRN: [id number] Date of Birth: 24-Jan-1951 Referring Provider:     Cardiac Rehab from 07/04/2018 in [facility] Cardiac and Pulmonary Rehab  Referring Provider  End      Initial Encounter Date:    Cardiac Rehab from 07/04/2018 in [facility] Cardiac and Pulmonary Rehab  Date  07/04/18      Visit Diagnosis: ST elevation myocardial infarction (STEMI), unspecified artery ([place])  Patient's Home Medications on Admission:  Current Outpatient Medications:  .  amiodarone (PACERONE) 200 MG tablet, Take 1 tablet (200 mg total) by mouth daily., Disp: 90 tablet, Rfl: 3 .  aspirin 81 MG chewable tablet, Chew 1 tablet (81 mg total) by mouth daily., Disp: , Rfl:  .  atorvastatin (LIPITOR) 80 MG tablet, Take 1 tablet (80 mg total) by mouth daily at 6 PM., Disp: 90 tablet, Rfl: 1 .  carvedilol (COREG) 6.25 MG tablet, Take 1 tablet (6.25 mg total) by mouth 2 (two) times daily., Disp: 180 tablet, Rfl: 3 .  lisinopril (PRINIVIL,ZESTRIL) 5 MG tablet, Take 1 tablet (5 mg total) by mouth daily., Disp: 30 tablet, Rfl: 2 .  metFORMIN (GLUCOPHAGE) 500 MG tablet, Take 1 tablet (500 mg total) by mouth 2 (two) times daily with a meal., Disp: 60 tablet, Rfl: 1 .  Multiple Vitamin (MULTIVITAMIN WITH MINERALS) TABS, Take 1 tablet by mouth daily., Disp: , Rfl:  .  nitroGLYCERIN (NITROSTAT) 0.4 MG SL tablet, Place 1 tablet (0.4 mg total) under the tongue every 5 (five) minutes as needed., Disp: 25 tablet, Rfl: 2 .  ticagrelor (BRILINTA) 90 MG TABS tablet, Take 1 tablet (90 mg total) by mouth 2 (two) times daily., Disp: 180 tablet, Rfl: 2 No current facility-administered medications for this visit.   Facility-Administered Medications Ordered in Other Visits:  .  0.9 %  sodium chloride infusion, , , Continuous PRN, End, Christopher, MD, Last Rate: 999 mL/hr at 05/31/18 2102, 999 mL/hr at 05/31/18 2102 .  amiodarone (NEXTERONE PREMIX) 360-4.14 MG/200ML-% (1.8 mg/mL)  IV infusion, , , Continuous PRN, End, Christopher, MD, Last Rate: 33.3 mL/hr at 05/31/18 2130, 60 mg/hr at 05/31/18 2130 .  amiodarone (NEXTERONE) 1.8 mg/mL load via infusion, , , PRN, End, Christopher, MD, 150 mg at 05/31/18 2123 .  fentaNYL (SUBLIMAZE) injection, , , PRN, End, Christopher, MD, 25 mcg at 05/31/18 2126 .  heparin injection, , , PRN, End, Harrell Gave, MD, 5,000 Units at 05/31/18 2105 .  lidocaine (PF) (XYLOCAINE) 1 % injection, , , PRN, End, Christopher, MD, 2 mL at 05/31/18 2058 .  midazolam (VERSED) injection, , , PRN, End, Christopher, MD, 1 mg at 05/31/18 2126 .  Radial Cocktail/Verapamil only, , , PRN, End, Christopher, MD, 10 mL at 05/31/18 2059  Past Medical History: Past Medical History:  Diagnosis Date  . Acute systolic (congestive) heart failure ([place])   . Diabetes ([place])   . Hyperlipidemia   . Hypertension   . PAF (paroxysmal atrial fibrillation) ([place])   . STEMI (ST elevation myocardial infarction) ([place])    06/01/18 PCI/DES x1 to the mLAD, IABP, EF 40-45%  . V-tach ([place])     Tobacco Use: Social History   Tobacco Use  Smoking Status Never Smoker  Smokeless Tobacco Never Used    Labs: Recent Review Flowsheet Data    Labs for ITP Cardiac and Pulmonary Rehab Latest Ref Rng & Units 05/31/2018 05/31/2018 05/31/2018 06/02/2018 06/04/2018   Cholestrol 0 - 200 mg/dL - 198 - - -  LDLCALC 0 - 99 mg/dL - 134(H) - - -   HDL >40 mg/dL - 30(L) - - -   Trlycerides <150 mg/dL - 170(H) - - -   Hemoglobin A1c 4.8 - 5.6 % - 7.3(H) - - 7.1(H)   PHART 7.350 - 7.450 - - 7.299(L) - -   PCO2ART 32.0 - 48.0 mmHg - - 48.3(H) - -   HCO3 20.0 - 28.0 mmol/L - - 23.7 - -   TCO2 22 - 32 mmol/L 23 - 25 - -   ACIDBASEDEF 0.0 - 2.0 mmol/L - - 3.0(H) - -   O2SAT % - - 97.0 62.2 -       Exercise Target Goals: Exercise Program Goal: Individual exercise prescription set using results from initial 6 min walk test and THRR while considering  patient's activity barriers and safety.    Exercise Prescription Goal: Initial exercise prescription builds to 30-45 minutes a day of aerobic activity, 2-3 days per week.  Home exercise guidelines will be given to patient during program as part of exercise prescription that the participant will acknowledge.  Activity Barriers & Risk Stratification: Activity Barriers & Cardiac Risk Stratification - 07/04/18 1312      Activity Barriers & Cardiac Risk Stratification   Activity Barriers  Other (comment)    Comments  inner ear issues, sometimes has issues bending down     Cardiac Risk Stratification  High       6 Minute Walk: 6 Minute Walk    Row Name 07/04/18 1436         6 Minute Walk   Distance  980 feet     Walk Time  6 minutes     # of Rest Breaks  0     MPH  1.85     METS  2.06     RPE  12     Perceived Dyspnea   0     VO2 Peak  7.22     Symptoms  No     Resting HR  64 bpm     Resting BP  114/62     Resting Oxygen Saturation   96 %     Exercise Oxygen Saturation  during 6 min walk  97 %     Max Ex. HR  115 bpm     Max Ex. BP  154/74     2 Minute Post BP  120/74        Oxygen Initial Assessment:   Oxygen Re-Evaluation:   Oxygen Discharge (Final Oxygen Re-Evaluation):   Initial Exercise Prescription: Initial Exercise Prescription - 07/04/18 1400      Date of Initial Exercise RX and Referring Provider   Date  07/04/18    Referring Provider  End      Treadmill   MPH  1.5    Grade  0    Minutes  15    METs  2.15      Recumbant Bike   Level  1    RPM  60    Minutes  15    METs  2      NuStep   Level  2    SPM  80    Minutes  15    METs  2      Prescription Details   Frequency (times per week)  3    Duration  Progress to 45 minutes of aerobic exercise without signs/symptoms of physical distress      Intensity  THRR 40-80% of Max Heartrate  99-135    Ratings of Perceived Exertion  11-13    Perceived Dyspnea  0-4      Resistance Training   Training Prescription  Yes    Weight  3  lb    Reps  10-15       Perform Capillary Blood Glucose checks as needed.  Exercise Prescription Changes: Exercise Prescription Changes    Row Name 07/04/18 1300 07/12/18 0800 07/17/18 0900         Response to Exercise   Blood Pressure (Admit)  114/62  -  146/74     Blood Pressure (Exercise)  154/74  -  138/78     Blood Pressure (Exit)  120/74  -  126/67     Heart Rate (Admit)  66 bpm  -  69 bpm     Heart Rate (Exercise)  115 bpm  -  109 bpm     Heart Rate (Exit)  90 bpm  -  71 bpm     Oxygen Saturation (Admit)  96 %  -  -     Oxygen Saturation (Exercise)  96 %  -  -     Rating of Perceived Exertion (Exercise)  12  -  12     Perceived Dyspnea (Exercise)  0  -  -     Symptoms  -  -  none     Duration  -  -  Continue with 45 min of aerobic exercise without signs/symptoms of physical distress.     Intensity  -  -  THRR unchanged       Progression   Progression  -  -  Continue to progress workloads to maintain intensity without signs/symptoms of physical distress.     Average METs  -  -  2.02       Resistance Training   Training Prescription  -  -  Yes     Weight  -  -  3 lbs     Reps  -  -  10-15       Interval Training   Interval Training  -  -  No       Treadmill   MPH  -  -  1.5     Grade  -  -  0     Minutes  -  -  15     METs  -  -  2.15       NuStep   Level  -  -  4     Minutes  -  -  15     METs  -  -  2.4       Arm Ergometer   Level  -  -  1     Minutes  -  -  15     METs  -  -  1.5       Home Exercise Plan   Plans to continue exercise at  -  Home (comment) walking  Home (comment) walking     Frequency  -  Add 2 additional days to program exercise sessions.  Add 2 additional days to program exercise sessions.     Initial Home Exercises Provided  -  07/12/18  07/12/18        Exercise Comments: Exercise Comments    Row Name 07/08/18 0801           Exercise Comments   First full day of  exercise!  Patient was oriented to gym and equipment  including functions, settings, policies, and procedures.  Patient's individual exercise prescription and treatment plan were reviewed.  All starting workloads were established based on the results of the 6 minute walk test done at initial orientation visit.  The plan for exercise progression was also introduced and progression will be customized based on patient's performance and goals.          Exercise Goals and Review: Exercise Goals    Row Name 07/04/18 1434             Exercise Goals   Increase Physical Activity  Yes       Intervention  Provide advice, education, support and counseling about physical activity/exercise needs.;Develop an individualized exercise prescription for aerobic and resistive training based on initial evaluation findings, risk stratification, comorbidities and participant's personal goals.       Expected Outcomes  Short Term: Attend rehab on a regular basis to increase amount of physical activity.;Long Term: Add in home exercise to make exercise part of routine and to increase amount of physical activity.;Long Term: Exercising regularly at least 3-5 days a week.       Increase Strength and Stamina  Yes       Intervention  Provide advice, education, support and counseling about physical activity/exercise needs.;Develop an individualized exercise prescription for aerobic and resistive training based on initial evaluation findings, risk stratification, comorbidities and participant's personal goals.       Expected Outcomes  Short Term: Increase workloads from initial exercise prescription for resistance, speed, and METs.;Short Term: Perform resistance training exercises routinely during rehab and add in resistance training at home;Long Term: Improve cardiorespiratory fitness, muscular endurance and strength as measured by increased METs and functional capacity (6MWT)       Able to understand and use rate of perceived exertion (RPE) scale  Yes       Intervention  Provide  education and explanation on how to use RPE scale       Expected Outcomes  Short Term: Able to use RPE daily in rehab to express subjective intensity level;Long Term:  Able to use RPE to guide intensity level when exercising independently       Able to understand and use Dyspnea scale  Yes       Intervention  Provide education and explanation on how to use Dyspnea scale       Expected Outcomes  Short Term: Able to use Dyspnea scale daily in rehab to express subjective sense of shortness of breath during exertion;Long Term: Able to use Dyspnea scale to guide intensity level when exercising independently       Knowledge and understanding of Target Heart Rate Range (THRR)  Yes       Intervention  Provide education and explanation of THRR including how the numbers were predicted and where they are located for reference       Expected Outcomes  Short Term: Able to state/look up THRR;Short Term: Able to use daily as guideline for intensity in rehab;Long Term: Able to use THRR to govern intensity when exercising independently       Able to check pulse independently  Yes       Intervention  Provide education and demonstration on how to check pulse in carotid and radial arteries.;Review the importance of being able to check your own pulse for safety during independent exercise       Expected Outcomes  Short Term: Able to explain why  pulse checking is important during independent exercise;Long Term: Able to check pulse independently and accurately       Understanding of Exercise Prescription  Yes       Intervention  Provide education, explanation, and written materials on patient's individual exercise prescription       Expected Outcomes  Short Term: Able to explain program exercise prescription;Long Term: Able to explain home exercise prescription to exercise independently          Exercise Goals Re-Evaluation : Exercise Goals Re-Evaluation    Row Name 07/08/18 0802 07/12/18 0834 07/17/18 0946          Exercise Goal Re-Evaluation   Exercise Goals Review  Increase Physical Activity;Increase Strength and Stamina;Able to understand and use rate of perceived exertion (RPE) scale;Knowledge and understanding of Target Heart Rate Range (THRR);Understanding of Exercise Prescription  Increase Physical Activity;Increase Strength and Stamina;Able to understand and use rate of perceived exertion (RPE) scale;Knowledge and understanding of Target Heart Rate Range (THRR);Able to check pulse independently;Understanding of Exercise Prescription  Increase Physical Activity;Increase Strength and Stamina;Understanding of Exercise Prescription     Comments  Reviewed RPE scale, THR and program prescription with pt today.  Pt voiced understanding and was given a copy of goals to take home.   Reviewed home exercise with pt today.  Pt plans to walk at home for exercise.  Reviewed THR, pulse, RPE, sign and symptoms, NTG use, and when to call 911 or MD.  Also discussed weather considerations and indoor options.  Pt voiced understanding.  Susie is off to a good start in rehab.  She is already up to level 4 on the NuStep.  We will continue to monitor her progression.      Expected Outcomes  Short: Use RPE daily to regulate intensity. Long: Follow program prescription in THR.  Short: Start adding more walking at home, up to 30 min.  Long: Start to tackle her hill!!  Short: Continue to attend rehab regularly.  Long: Continue to improve strength and stamina.         Discharge Exercise Prescription (Final Exercise Prescription Changes): Exercise Prescription Changes - 07/17/18 0900      Response to Exercise   Blood Pressure (Admit)  146/74    Blood Pressure (Exercise)  138/78    Blood Pressure (Exit)  126/67    Heart Rate (Admit)  69 bpm    Heart Rate (Exercise)  109 bpm    Heart Rate (Exit)  71 bpm    Rating of Perceived Exertion (Exercise)  12    Symptoms  none    Duration  Continue with 45 min of aerobic exercise without  signs/symptoms of physical distress.    Intensity  THRR unchanged      Progression   Progression  Continue to progress workloads to maintain intensity without signs/symptoms of physical distress.    Average METs  2.02      Resistance Training   Training Prescription  Yes    Weight  3 lbs    Reps  10-15      Interval Training   Interval Training  No      Treadmill   MPH  1.5    Grade  0    Minutes  15    METs  2.15      NuStep   Level  4    Minutes  15    METs  2.4      Arm Ergometer   Level  1  Minutes  15    METs  1.5      Home Exercise Plan   Plans to continue exercise at  Home (comment)   walking   Frequency  Add 2 additional days to program exercise sessions.    Initial Home Exercises Provided  07/12/18       Nutrition:  Target Goals: Understanding of nutrition guidelines, daily intake of sodium <1559m, cholesterol <204m calories 30% from fat and 7% or less from saturated fats, daily to have 5 or more servings of fruits and vegetables.  Biometrics: Pre Biometrics - 07/04/18 1433      Pre Biometrics   Height  _0  (1.575 m)    Weight  225 lb 11.2 oz (102.4 kg)    Waist Circumference  44 inches    Hip Circumference  55.25 inches    Waist to Hip Ratio  0.8 %    BMI (Calculated)  41.27    Single Leg Stand  1.5 seconds        Nutrition Therapy Plan and Nutrition Goals: Nutrition Therapy & Goals - 07/08/18 0908      Nutrition Therapy   Diet  DASH    Drug/Food Interactions  Statins/Certain Fruits    Protein (specify units)  9oz    Fiber  20 grams    Whole Grain Foods  3 servings   eats whole grain bread   Saturated Fats  12 max. grams    Fruits and Vegetables  5 servings/day   8 ideal, working to increase fruit & vegetable intake   Sodium  1500 grams      Personal Nutrition Goals   Nutrition Goal  Continue to work on making low sodium, fat and sugar food and beverage choices. Great job so far!    Personal Goal #2  When choosing plant-based  protein options, make sure to check the sodium and fat content. Just because it's meat-free does not make it a heart healthy choice    Personal Goal #3  Try warming up frozen fruit or adding stevia/brown sugar blend to oatmeal, rather than adding regular brown sugar, to decrease the sugar content of this meal    Comments  She has made dramatic changes to her diet since hospitalization. 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   General guidelines for heart healthy eating handout, sodium free seasoning blend sheet, low sodium nutrition therapy handout   Expected Outcomes  Short Term Goal: A plan has been developed with personal nutrition goals set during dietitian appointment.;Long Term Goal: Adherence to prescribed nutrition plan.;Short Term Goal: Understand basic principles of dietary content, such as calories, fat, sodium, cholesterol and nutrients.       Nutrition Assessments: Nutrition Assessments - 07/04/18 1312      MEDFICTS Scores   Pre Score  6       Nutrition Goals Re-Evaluation: Nutrition Goals Re-Evaluation    [place] 07/08/18 0920             Goals   Nutrition  Goal  Continue to work on making low sodium, fat and sugar food and beverage choices. Great job so far!        Comment  She reports to be eating less than 1555m of sodium per day, chooses zero sugar beverages and low sugar snacks, and has cut out fatty meats. She is no longer eating canned items  or most packaged foods. She is being much more mindful about her diet overall       Expected Outcome  She will continue to educate herself on low sodium, sugar and fat options, continue to lose weight, and improve her overall health as desired         Personal Goal #2 Re-Evaluation   Personal Goal #2  When choosing plant-based protein options, make sure to check the sodium and fat content. Just because it's meat-free does not make it a heart healthy choice         Personal Goal #3 Re-Evaluation   Personal Goal #3  Try warming up frozen fruit or adding stevia/brown sugar blend to oatmeal, rather than adding regular brown sugar, to decrease the sugar content of this meal          Nutrition Goals Discharge (Final Nutrition Goals Re-Evaluation): Nutrition Goals Re-Evaluation - 07/08/18 0920      Goals   Nutrition Goal  Continue to work on making low sodium, fat and sugar food and beverage choices. Great job so far!     Comment  She reports to be eating less than 15033mof sodium per day, chooses zero sugar beverages and low sugar snacks, and has cut out fatty meats. She is no longer eating canned items or most packaged foods. She is being much more mindful about her diet overall    Expected Outcome  She will continue to educate herself on low sodium, sugar and fat options, continue to lose weight, and improve her overall health as desired      Personal Goal #2 Re-Evaluation   Personal Goal #2  When choosing plant-based protein options, make sure to check the sodium and fat content. Just because it's meat-free does not make it a heart healthy choice      Personal Goal #3 Re-Evaluation   Personal Goal #3  Try warming up frozen fruit or adding stevia/brown sugar blend to oatmeal, rather than adding regular brown sugar, to decrease the sugar content of this meal       Psychosocial: Target Goals: Acknowledge presence or absence of significant depression and/or stress, maximize coping skills,  provide positive support system. Participant is able to verbalize types and ability to use techniques and skills needed for reducing stress and depression.   Initial Review & Psychosocial Screening: Initial Psych Review & Screening - 07/04/18 1308      Initial Review   Current issues with  Current Anxiety/Panic;Current Stress Concerns    Source of Stress Concerns  Financial    Comments  [place] children as her source of income, so she has to depend on them to keep coming. She has anxiety about her heart and how she will feel while exercising. She has started walking at home. She feels like this is her "second chance at life" after her heart attack and is trying to make changes. She is very motivated to begin this "heart healthy lifestyle" and reported feeling a lot more confident in herself after orientation.       Family Dynamics   Good Support System?  Yes  Family, Friends, [place] group, neighbors     Barriers   Psychosocial barriers to participate in program  There are no identifiable barriers or psychosocial needs.      Screening Interventions   Interventions  Encouraged to exercise;Program counselor consult;To provide support and resources with identified psychosocial needs;Provide feedback about the scores to participant    Expected Outcomes  Short Term goal: Utilizing psychosocial counselor, staff and physician to assist with identification of specific Stressors or current issues interfering with healing process. Setting desired goal for each stressor or current issue identified.;Long Term Goal: Stressors or current issues are controlled or eliminated.;Short Term goal: Identification and review with participant of any Quality of Life or Depression concerns found by scoring the questionnaire.;Long Term goal: The participant improves quality of Life and PHQ9 Scores as seen by post scores and/or verbalization of changes       Quality of Life Scores:  Quality of Life - 07/04/18 1311       Quality of Life   Select  Quality of Life      Quality of Life Scores   Health/Function Pre  26.79 %    Socioeconomic Pre  24.5 %    Psych/Spiritual Pre  28.57 %    Family Pre  28.5 %    GLOBAL Pre  26.97 %      Scores of 19 and below usually indicate a poorer quality of life in these areas.  A difference of  2-3 points is a clinically meaningful difference.  A difference of 2-3 points in the total score of the Quality of Life Index has been associated with significant improvement in overall quality of life, self-image, physical symptoms, and general health in studies assessing change in quality of life.  PHQ-9: Recent Review Flowsheet Data    Depression screen [facility] 2/9 07/04/2018   Decreased Interest 0   Down, Depressed, Hopeless 0   PHQ - 2 Score 0   Altered sleeping 1   Tired, decreased energy 1   Change in appetite 0   Feeling bad or failure about yourself  0   Trouble concentrating 0   Moving slowly or fidgety/restless 0   Suicidal thoughts 0   PHQ-9 Score 2   Difficult doing work/chores Not difficult at all     Interpretation of Total Score  Total Score Depression Severity:  1-4 = Minimal depression, 5-9 = Mild depression, 10-14 = Moderate depression, 15-19 = Moderately severe depression, 20-27 = Severe depression   Psychosocial Evaluation and Intervention: Psychosocial Evaluation - 07/08/18 0935      Psychosocial Evaluation & Interventions   Interventions  Relaxation education;Stress management education;Encouraged to exercise with the program and follow exercise prescription    Comments  Counselor met with Ms. Thalia Bloodgood Daine Floras) today for initial psychosocial evaluation.  She is a 67 year old who had a heart attack and one stent inserted in mid-July.  Susie has a strong support system with close friends and a faith community and her sons live in [place] and [place], but call daily.  She struggles with pre-diabetes and vertigo and is on medications for these.  Susie  reports sleeping well and having a healthier appetite now since her cardiac event.  She denies a history of depression or anxiety but has experienced some anxiety since her cardiac event.  She reports typically being in a positive mood and other than her health and finances has minimal stress in her life.  She was reportedly anxious about coming into class  today and does not feel comfortable driving yet, but plans to ease into that this coming weekend.  Susie has goals to lose weight; feel stronger and get back on track with her health.  Staff will follow with her.      Expected Outcomes  Short:  Susie will continue a healthier diet and exercise plan for her health and mental health - current stress and anxiety symptoms.  She will attend the psychoeducational components to learn strategies to cope and manage stress.   Susie will also meet with the dietician for her weight loss goals.   Long:  Susie will maintain a healthier lifestyle of diet and exercise for her health and mental health.     Continue Psychosocial Services   Follow up required by staff       Psychosocial Re-Evaluation:   Psychosocial Discharge (Final Psychosocial Re-Evaluation):   Vocational Rehabilitation: Provide vocational rehab assistance to qualifying candidates.   Vocational Rehab Evaluation & Intervention: Vocational Rehab - 07/04/18 1308      Initial Vocational Rehab Evaluation & Intervention   Assessment shows need for Vocational Rehabilitation  No       Education: Education Goals: Education classes will be provided on a variety of topics geared toward better understanding of heart health and risk factor modification. Participant will state understanding/return demonstration of topics presented as noted by education test scores.  Learning Barriers/Preferences: Learning Barriers/Preferences - 07/04/18 1307      Learning Barriers/Preferences   Learning Barriers  Sight   wears glasses   Learning Preferences  None        Education Topics:  AED/CPR: - Group verbal and written instruction with the use of models to demonstrate the basic use of the AED with the basic ABC's of resuscitation.   General Nutrition Guidelines/Fats and Fiber: -Group instruction provided by verbal, written material, models and posters to present the general guidelines for heart healthy nutrition. Gives an explanation and review of dietary fats and fiber.   Cardiac Rehab from 07/22/2018 in [facility] Cardiac and Pulmonary Rehab  Date  07/22/18  Educator  LB  Instruction Review Code  1- Verbalizes Understanding      Controlling Sodium/Reading Food Labels: -Group verbal and written material supporting the discussion of sodium use in heart healthy nutrition. Review and explanation with models, verbal and written materials for utilization of the food label.   Exercise Physiology & General Exercise Guidelines: - Group verbal and written instruction with models to review the exercise physiology of the cardiovascular system and associated critical values. Provides general exercise guidelines with specific guidelines to those with heart or lung disease.    Aerobic Exercise & Resistance Training: - Gives group verbal and written instruction on the various components of exercise. Focuses on aerobic and resistive training programs and the benefits of this training and how to safely progress through these programs..   Flexibility, Balance, Mind/Body Relaxation: Provides group verbal/written instruction on the benefits of flexibility and balance training, including mind/body exercise modes such as yoga, pilates and tai chi.  Demonstration and skill practice provided.   Stress and Anxiety: - Provides group verbal and written instruction about the health risks of elevated stress and causes of high stress.  Discuss the correlation between heart/lung disease and anxiety and treatment options. Review healthy ways to manage with stress and  anxiety.   Depression: - Provides group verbal and written instruction on the correlation between heart/lung disease and depressed mood, treatment options, and the stigmas associated with seeking  treatment.   Anatomy & Physiology of the Heart: - Group verbal and written instruction and models provide basic cardiac anatomy and physiology, with the coronary electrical and arterial systems. Review of Valvular disease and Heart Failure   Cardiac Procedures: - Group verbal and written instruction to review commonly prescribed medications for heart disease. Reviews the medication, class of the drug, and side effects. Includes the steps to properly store meds and maintain the prescription regimen. (beta blockers and nitrates)   Cardiac Medications I: - Group verbal and written instruction to review commonly prescribed medications for heart disease. Reviews the medication, class of the drug, and side effects. Includes the steps to properly store meds and maintain the prescription regimen.   Cardiac Rehab from 07/22/2018 in [facility] Cardiac and Pulmonary Rehab  Date  07/08/18  Educator  SB  Instruction Review Code  1- Verbalizes Understanding      Cardiac Medications II: -Group verbal and written instruction to review commonly prescribed medications for heart disease. Reviews the medication, class of the drug, and side effects. (all other drug classes)    Go Sex-Intimacy & Heart Disease, Get SMART - Goal Setting: - Group verbal and written instruction through game format to discuss heart disease and the return to sexual intimacy. Provides group verbal and written material to discuss and apply goal setting through the application of the S.M.A.R.T. Method.   Other Matters of the Heart: - Provides group verbal, written materials and models to describe Stable Angina and Peripheral Artery. Includes description of the disease process and treatment options available to the cardiac patient.   Exercise  & Equipment Safety: - Individual verbal instruction and demonstration of equipment use and safety with use of the equipment.   Cardiac Rehab from 07/22/2018 in [facility] Cardiac and Pulmonary Rehab  Date  07/04/18  Educator  [facility]  Instruction Review Code  1- Verbalizes Understanding      Infection Prevention: - Provides verbal and written material to individual with discussion of infection control including proper hand washing and proper equipment cleaning during exercise session.   Cardiac Rehab from 07/22/2018 in [facility] Cardiac and Pulmonary Rehab  Date  07/04/18  Educator  [facility]  Instruction Review Code  1- Verbalizes Understanding      Falls Prevention: - Provides verbal and written material to individual with discussion of falls prevention and safety.   Cardiac Rehab from 07/22/2018 in [facility] Cardiac and Pulmonary Rehab  Date  07/04/18  Educator  [facility]  Instruction Review Code  1- Verbalizes Understanding      Diabetes: - Individual verbal and written instruction to review signs/symptoms of diabetes, desired ranges of glucose level fasting, after meals and with exercise. Acknowledge that pre and post exercise glucose checks will be done for 3 sessions at entry of program.   Cardiac Rehab from 07/22/2018 in [facility] Cardiac and Pulmonary Rehab  Date  07/04/18  Educator  [facility]  Instruction Review Code  1- Verbalizes Understanding      Know Your Numbers and Risk Factors: -Group verbal and written instruction about important numbers in your health.  Discussion of what are risk factors and how they play a role in the disease process.  Review of Cholesterol, Blood Pressure, Diabetes, and BMI and the role they play in your overall health.   Sleep Hygiene: -Provides group verbal and written instruction about how sleep can affect your health.  Define sleep hygiene, discuss sleep cycles and impact of sleep habits. Review good sleep hygiene tips.  Other: -Provides group and verbal instruction on various  topics (see comments)   Knowledge Questionnaire Score: Knowledge Questionnaire Score - 07/04/18 1307      Knowledge Questionnaire Score   Pre Score  22/26   correct answers reviewed with patient, focus on nutrition and A&P      Core Components/Risk Factors/Patient Goals at Admission: Personal Goals and Risk Factors at Admission - 07/04/18 1306      Core Components/Risk Factors/Patient Goals on Admission    Weight Management  Yes;Obesity;Weight Loss    Intervention  Weight Management: Develop a combined nutrition and exercise program designed to reach desired caloric intake, while maintaining appropriate intake of nutrient and fiber, sodium and fats, and appropriate energy expenditure required for the weight goal.;Weight Management: Provide education and appropriate resources to help participant work on and attain dietary goals.;Weight Management/Obesity: Establish reasonable short term and long term weight goals.;Obesity: Provide education and appropriate resources to help participant work on and attain dietary goals.    Admit Weight  225 lb (102.1 kg)    Goal Weight: Short Term  220 lb (99.8 kg)    Goal Weight: Long Term  160 lb (72.6 kg)    Expected Outcomes  Short Term: Continue to assess and modify interventions until short term weight is achieved;Long Term: Adherence to nutrition and physical activity/exercise program aimed toward attainment of established weight goal;Weight Loss: Understanding of general recommendations for a balanced deficit meal plan, which promotes 1-2 lb weight loss per week and includes a negative energy balance of [phone] kcal/d;Understanding recommendations for meals to include 15-35% energy as protein, 25-35% energy from fat, 35-60% energy from carbohydrates, less than 266m of dietary cholesterol, 20-35 gm of total fiber daily;Understanding of distribution of calorie intake throughout the day with the consumption of 4-5 meals/snacks    Diabetes  Yes     Intervention  Provide education about signs/symptoms and action to take for hypo/hyperglycemia.;Provide education about proper nutrition, including hydration, and aerobic/resistive exercise prescription along with prescribed medications to achieve blood glucose in normal ranges: Fasting glucose 65-99 mg/dL    Expected Outcomes  Short Term: Participant verbalizes understanding of the signs/symptoms and immediate care of hyper/hypoglycemia, proper foot care and importance of medication, aerobic/resistive exercise and nutrition plan for blood glucose control.;Long Term: Attainment of HbA1C < 7%.    Hypertension  Yes    Intervention  Provide education on lifestyle modifcations including regular physical activity/exercise, weight management, moderate sodium restriction and increased consumption of fresh fruit, vegetables, and low fat dairy, alcohol moderation, and smoking cessation.;Monitor prescription use compliance.    Expected Outcomes  Short Term: Continued assessment and intervention until BP is < 140/919mHG in hypertensive participants. < 130/8046mG in hypertensive participants with diabetes, heart failure or chronic kidney disease.;Long Term: Maintenance of blood pressure at goal levels.    Lipids  Yes    Intervention  Provide education and support for participant on nutrition & aerobic/resistive exercise along with prescribed medications to achieve LDL <69m55mDL >40mg17m Expected Outcomes  Short Term: Participant states understanding of desired cholesterol values and is compliant with medications prescribed. Participant is following exercise prescription and nutrition guidelines.;Long Term: Cholesterol controlled with medications as prescribed, with individualized exercise RX and with personalized nutrition plan. Value goals: LDL < 69mg,55m > 40 mg.       Core Components/Risk Factors/Patient Goals Review:    Core Components/Risk Factors/Patient Goals at Discharge (Final Review):    ITP  Comments: ITP Comments  [place] Name 07/04/18 1254 07/24/18 0537         ITP Comments  Med Review completed. Initial ITP created. Diagnosis can be found in [facility] 8/8  30 day review completed. ITP sent to Dr. Emily Filbert, Medical Director of Cardiac Rehab. Continue with ITP unless changes are made by physician         Comments:

## 2018-07-24 NOTE — Patient Instructions (Addendum)
Medication Instructions: Your physician recommends that you continue on your current medications as directed. Please refer to the Current Medication list given to you today.  5 bottles of Brilinta 90 mg samples have been given  If you need a refill on your cardiac medications before your next appointment, please call your pharmacy.   Labwork: Your provider would like for you to return in 2 weeks to have the following labs drawn: FASTING Lipid and Liver. Please go to the [facility] entrance and check in at the front desk. You do not need an appointment.    Procedures/Testing: Your physician has requested that you have an echocardiogram at the end of October. Echocardiography is a painless test that uses sound waves to create images of your heart. It provides your doctor with information about the size and shape of your heart and how well your heart's chambers and valves are working. You may receive an ultrasound enhancing agent through an IV if needed to better visualize your heart during the echo.This procedure takes approximately one hour. There are no restrictions for this procedure. This will take place at the [facility] clinic.    Follow-Up: Your physician wants you to follow-up at the end of November with Dr. Saunders Revel or an APP. You will receive a reminder letter in the mail two months in advance. If you don't receive a letter, please call our office at [phone] to schedule this follow-up appointment.  Thank you for choosing [facility] at [facility]!

## 2018-07-24 NOTE — Progress Notes (Signed)
Office Visit    Patient Name: Lindsey Pierce Date of Encounter: 07/24/2018  Primary Care Provider:  [name], NP (Inactive) Primary Cardiologist:  Lindsey Bush, MD  Chief Complaint    67 year old female who presents for follow-up today related to prior history of CAD and anterior STEMI, HFrEF, ischemic cardiomyopathy, hypertension, hyperlipidemia, paroxysmal atrial fibrillation, diabetes, and ventricular tachycardia.  Past Medical History    Past Medical History:  Diagnosis Date  . CAD (coronary artery disease)    a. 05/2018 Ant STEMI/PCI: LM nl, LAD 30m(2.75 x 16 Synergy DES), D2 99 (PTCA), RI 40ost, LCX mild diff dzs, OM1/2/3 nl, RCA large, diffuse dzs, RPDA nl, RPAV 60, RPLB1 80 (small).  . Diabetes ([place])   . HFrEF (heart failure with reduced ejection fraction) ([place])    a. 05/2018 Echo: EF 40-45%, mod LVH, Gr2 DD, mid antsept, apical septal, apical AK. Mid-apical ant, mid infsept HK. Nl RV fxn. Triv TR.   Marland Kitchen Hyperlipidemia   . Hypertension   . Ischemic cardiomyopathy    a. 05/2018 Echo: EF 40-45%.  Marland Kitchen PAF (paroxysmal atrial fibrillation) ([place])    a. 05/2018 at time of STEMI, converted on Amio. No OAC.  Marland Kitchen STEMI involving left anterior descending coronary artery ([place])    a. 02/3153 - complicated by CGS req IABP and VT req amiodarone.  . V-tach ([place])    a. 05/2018 at time of Ant STEMI-->Amio.   Past Surgical History:  Procedure Laterality Date  . CORONARY/GRAFT ACUTE MI REVASCULARIZATION N/A 05/31/2018   Procedure: Coronary/Graft Acute MI Revascularization;  Surgeon: Lindsey Bush, MD;  Location: [place] [facility];  Service: Cardiovascular;  Laterality: N/A;  . IABP INSERTION N/A 05/31/2018   Procedure: IABP Insertion;  Surgeon: Lindsey Bush, MD;  Location: [place] [facility];  Service: Cardiovascular;  Laterality: N/A;  . LEFT HEART CATH AND CORONARY ANGIOGRAPHY N/A 05/31/2018   Procedure: LEFT HEART CATH AND CORONARY ANGIOGRAPHY;  Surgeon: Lindsey Bush, MD;   Location: [place] [facility];  Service: Cardiovascular;  Laterality: N/A;    Allergies  Allergies  Allergen Reactions  . Codeine Swelling    Mouth and tongue swelling (couldn't swallow)  . Darvon [Propoxyphene Hcl] Swelling    Mouth and tongue swelling (couldn't swallow)  . Penicillins Hives    Has patient had a PCN reaction causing immediate rash, facial/tongue/throat swelling, SOB or lightheadedness with hypotension: Yes Has patient had a PCN reaction causing severe rash involving mucus membranes or skin necrosis: No Has patient had a PCN reaction that required hospitalization: No Has patient had a PCN reaction occurring within the last 10 years: No If all of the above answers are "NO", then may proceed with Cephalosporin use.    History of Present Illness    67 year old female with the above complex past medical history including coronary artery disease status post anterior STEMI in July 0086 complicated by cardiogenic shock, atrial fibrillation, and ventricular tachycardia.  Other history also includes hypertension, hyperlipidemia, diabetes, ischemic cardia myopathy/HFrEF, and obesity.  She was admitted in July 2019 with chest pain and found to have anterior ST segment elevation as well as hypotension.  She was also in rapid atrial for ablation.  Catheterization revealed subtotal occlusion of the mid LAD involving the LAD and a sizable diagonal branch.  The LAD was successfully treated with drug-eluting stent while the diagonal required balloon angioplasty.  Due to hypotension, she did require intra-aortic balloon pump placement.  Cardioversion was performed but was initially unsuccessful and she  was placed on amiodarone with subsequent conversion to sinus rhythm.  Following discontinuation of amiodarone however, she developed atrial tachycardia and required reinstitution of amiodarone.  She was eventually discharged on oral amiodarone.  Lindsey Pierce was last seen in clinic in August, at  which time she was doing well.  Her amiodarone dose was reduced to 200 mg daily at that time with a plan to eventually discontinue it.  Since then, she has done remarkably well.  She is participating in cardiac rehabilitation and her weight is down another 7 pounds since her last visit.  She says she is feeling great and doing everything by the book.  She is compliant with her medications and is even walking on non-rehab days.  She has not had any chest pain, dyspnea, palpitations, PND, orthopnea, dizziness, syncope, edema, or early satiety.  Home Medications    Prior to Admission medications   Medication Sig Start Date Pierce Date Taking? Authorizing Provider  amiodarone (PACERONE) 200 MG tablet Take 1 tablet (200 mg total) by mouth daily. 06/20/18  Yes Pierce, Lindsey Gave, MD  aspirin 81 MG chewable tablet Chew 1 tablet (81 mg total) by mouth daily. 06/06/18  Yes Lindsey Manly, NP  atorvastatin (LIPITOR) 80 MG tablet Take 1 tablet (80 mg total) by mouth daily at 6 PM. 06/06/18  Yes Lindsey Manly, NP  carvedilol (COREG) 6.25 MG tablet Take 1 tablet (6.25 mg total) by mouth 2 (two) times daily. 06/20/18  Yes Pierce, Lindsey Gave, MD  lisinopril (PRINIVIL,ZESTRIL) 5 MG tablet Take 1 tablet (5 mg total) by mouth daily. 06/06/18  Yes Lindsey Manly, NP  metFORMIN (GLUCOPHAGE) 500 MG tablet Take 1 tablet (500 mg total) by mouth 2 (two) times daily with a meal. 06/06/18 08/05/18 Yes Lindsey Manly, NP  Multiple Vitamin (MULTIVITAMIN WITH MINERALS) TABS Take 1 tablet by mouth daily.   Yes [provider]  nitroGLYCERIN (NITROSTAT) 0.4 MG SL tablet Place 1 tablet (0.4 mg total) under the tongue every 5 (five) minutes as needed. 06/06/18  Yes Lindsey Manly, NP  ticagrelor (BRILINTA) 90 MG TABS tablet Take 1 tablet (90 mg total) by mouth 2 (two) times daily. 06/06/18  Yes Lindsey Manly, NP    Review of Systems    She denies chest pain, palpitations, dyspnea, pnd, orthopnea, n, v, dizziness,  syncope, edema, weight gain, or early satiety.  All other systems reviewed and are otherwise negative except as noted above.  Physical Exam    VS:  BP 130/88 (BP Location: Left Arm, Patient Position: Sitting, Cuff Size: Normal)   Pulse 64   Ht 5\' 1"  (1.549 m)   Wt 222 lb (100.7 kg)   BMI 41.95 kg/m  , BMI Body mass index is 41.95 kg/m. GEN: Well nourished, well developed, in no acute distress. HEENT: normal. Neck: Supple, no JVD, carotid bruits, or masses. Cardiac: RRR, 2/6 systolic murmur at the upper sternal borders, no rubs, or gallops. No clubbing, cyanosis, edema.  Radials/DP/PT 2+ and equal bilaterally.  Respiratory:  Respirations regular and unlabored, clear to auscultation bilaterally. GI: Soft, nontender, nondistended, BS + x 4. MS: no deformity or atrophy. Skin: warm and dry, no rash. Neuro:  Strength and sensation are intact. Psych: Normal affect.  Accessory Clinical Findings    ECG personally reviewed by me today -regular sinus rhythm, 64, left axis deviation, lvh, prior anteroseptal infarct.- no acute changes.  Assessment & Plan    1.  Coronary artery disease: Status post anterior STEMI in  July with drug-eluting stent placement to the LAD and PTCA of the diagonal.  She is not but is being in cardiac rehabilitation and has continued to do exceptionally well.  She is tolerating medications well and has not any recurrent symptoms or limitations.  She continues to have financial issues with affording Brilinta and we were able to provide her with more samples today.  Her pharmacy coverage insurance kicks in in late October and she says that she will make sure she remains on Brilinta as she does not want to rock the boat.  She otherwise remains on low-dose aspirin, beta-blocker, ACE inhibitor, and high potency statin therapy.  We will plan to follow-up lipids and LFTs in about 2 weeks.  2.  Ischemic cardiomyopathy/HFrEF: EF 40 to 45% by echo during time of hospitalization.  As  above, she has been doing exceptionally well and is euvolemic on exam today.  She remains on beta-blocker and ACE inhibitor therapy.  We will plan to follow-up echocardiogram in mid to late October.  3.  Paroxysmal atrial fibrillation: This occurred only in the setting of her presentation with STEMI.  She has been maintained on amiodarone and is currently on 200 mg daily.  She has not had any palpitations.  She is in sinus rhythm today with a heart rate of 64.  She is not on oral anticoagulation given the short duration and setting of her A. fib.  We would likely plan to discontinue amiodarone in the future.  4.  Ventricular tachycardia: This occurred post reperfusion.  She remains on amiodarone and is very concerned about possibly coming off of it in the future.  We agreed that we would follow-up in echo in October to reevaluate LV function and so long as it is stable or improved, we are likely safe to discontinue amiodarone and continue beta-blocker therapy.  She has not had any palpitations.  5.  Hyperlipidemia: Follow-up lipids and LFTs in 2 weeks.  Continue statin therapy.  6.  Type 2 diabetes mellitus: She has lost over 20 pounds since her discharge.  She remains on metformin therapy and is followed by primary care.  7.  Disposition: Follow-up lipids and LFTs in about 2 weeks.  Follow-up echo in mid to late October.  Follow-up in clinic in early November.  Murray Hodgkins, NP 07/24/2018, 4:47 PM

## 2018-07-26 ENCOUNTER — Encounter: Payer: Medicare Other | Admitting: *Deleted

## 2018-07-26 DIAGNOSIS — I213 ST elevation (STEMI) myocardial infarction of unspecified site: Secondary | ICD-10-CM

## 2018-07-26 DIAGNOSIS — I10 Essential (primary) hypertension: Secondary | ICD-10-CM | POA: Diagnosis not present

## 2018-07-26 DIAGNOSIS — Z955 Presence of coronary angioplasty implant and graft: Secondary | ICD-10-CM | POA: Diagnosis not present

## 2018-07-26 DIAGNOSIS — I255 Ischemic cardiomyopathy: Secondary | ICD-10-CM | POA: Diagnosis not present

## 2018-07-26 NOTE — Progress Notes (Signed)
Daily Session Note  Patient Details  Name: Barbee Mamula MRN: [id number] Date of Birth: 21-Feb-1951 Referring Provider:     Cardiac Rehab from 07/04/2018 in [facility] Cardiac and Pulmonary Rehab  Referring Provider  End      Encounter Date: 07/26/2018  Check In: Session Check In - 07/26/18 0801      Check-In   Supervising physician immediately available to respond to emergencies  See telemetry face sheet for immediately available ER MD    Location  [facility]-Cardiac & Pulmonary Rehab    Staff Present  Renita Papa, RN BSN;Darothy Courtright Luan Pulling, MA, RCEP, CCRP, Exercise Physiologist;Amanda Oletta Darter, [place], ACSM CEP, Exercise Physiologist    Medication changes reported      No    Fall or balance concerns reported     No    Warm-up and Cool-down  Performed on first and last piece of equipment    Resistance Training Performed  Yes    VAD Patient?  No    PAD/SET Patient?  No      Pain Assessment   Currently in Pain?  No/denies          Social History   Tobacco Use  Smoking Status Never Smoker  Smokeless Tobacco Never Used    Goals Met:  Independence with exercise equipment Exercise tolerated well No report of cardiac concerns or symptoms Strength training completed today  Goals Unmet:  Not Applicable  Comments: Pt able to follow exercise prescription today without complaint.  Will continue to monitor for progression.    Dr. Emily Filbert is Medical Director for [place] [facility].

## 2018-07-29 ENCOUNTER — Telehealth: Payer: Self-pay | Admitting: Internal Medicine

## 2018-07-29 ENCOUNTER — Other Ambulatory Visit: Payer: Self-pay | Admitting: Cardiology

## 2018-07-29 ENCOUNTER — Encounter: Payer: Medicare Other | Admitting: *Deleted

## 2018-07-29 DIAGNOSIS — I10 Essential (primary) hypertension: Secondary | ICD-10-CM | POA: Diagnosis not present

## 2018-07-29 DIAGNOSIS — I213 ST elevation (STEMI) myocardial infarction of unspecified site: Secondary | ICD-10-CM

## 2018-07-29 DIAGNOSIS — I255 Ischemic cardiomyopathy: Secondary | ICD-10-CM | POA: Diagnosis not present

## 2018-07-29 DIAGNOSIS — Z955 Presence of coronary angioplasty implant and graft: Secondary | ICD-10-CM | POA: Diagnosis not present

## 2018-07-29 MED ORDER — METFORMIN HCL 500 MG PO TABS: 500.0000 mg | ORAL_TABLET | Freq: Two times a day (BID) | ORAL | 0 refills | Status: DC

## 2018-07-29 NOTE — Telephone Encounter (Signed)
*  STAT* If patient is at the pharmacy, call can be transferred to refill team.   1. Which medications need to be refilled? (please list name of each medication and dose if known) MetFORMIN (GLUCOPHAGE) 1 tablet 2 times daily   2. Which pharmacy/location (including street and city if local pharmacy) is medication to be sent to? [facility] in [place]   3. Do they need a 30 day or 90 day supply? 30 day   Patient states Dr. Saunders Revel prescribed this medication in hospital.  Patient is in transition of PCP doctors and will not be seeing one until November.

## 2018-07-29 NOTE — Progress Notes (Signed)
Daily Session Note  Patient Details  Name: Lindsey Pierce MRN: [id number] Date of Birth: Aug 03, 1951 Referring Provider:     Cardiac Rehab from 07/04/2018 in [facility] Cardiac and Pulmonary Rehab  Referring Provider  End      Encounter Date: 07/29/2018  Check In: Session Check In - 07/29/18 0745      Check-In   Supervising physician immediately available to respond to emergencies  See telemetry face sheet for immediately available ER MD    Location  [facility]-Cardiac & Pulmonary Rehab    Staff Present  Alberteen Sam, MA, RCEP, CCRP, Exercise Physiologist;Zyeir Dymek Amedeo Plenty, BS, ACSM CEP, Exercise Physiologist;Susanne Bice, RN, BSN, CCRP    Medication changes reported      No    Fall or balance concerns reported     No    Tobacco Cessation  No Change    Warm-up and Cool-down  Performed on first and last piece of equipment    Resistance Training Performed  Yes    VAD Patient?  No    PAD/SET Patient?  No      Pain Assessment   Currently in Pain?  No/denies    Multiple Pain Sites  No          Social History   Tobacco Use  Smoking Status Never Smoker  Smokeless Tobacco Never Used    Goals Met:  Independence with exercise equipment Exercise tolerated well Personal goals reviewed No report of cardiac concerns or symptoms Strength training completed today  Goals Unmet:  Not Applicable  Comments: Pt able to follow exercise prescription today without complaint.  Will continue to monitor for progression.    Dr. Emily Filbert is Medical Director for [place] and [facility].

## 2018-07-31 ENCOUNTER — Encounter: Payer: Self-pay | Admitting: *Deleted

## 2018-07-31 DIAGNOSIS — Z006 Encounter for examination for normal comparison and control in clinical research program: Secondary | ICD-10-CM

## 2018-07-31 DIAGNOSIS — I10 Essential (primary) hypertension: Secondary | ICD-10-CM | POA: Diagnosis not present

## 2018-07-31 DIAGNOSIS — Z955 Presence of coronary angioplasty implant and graft: Secondary | ICD-10-CM | POA: Diagnosis not present

## 2018-07-31 DIAGNOSIS — I213 ST elevation (STEMI) myocardial infarction of unspecified site: Secondary | ICD-10-CM

## 2018-07-31 DIAGNOSIS — I255 Ischemic cardiomyopathy: Secondary | ICD-10-CM | POA: Diagnosis not present

## 2018-07-31 NOTE — Progress Notes (Signed)
Daily Session Note  Patient Details  Name: Lindsey Pierce MRN: [id number] Date of Birth: 04-04-1951 Referring Provider:     Cardiac Rehab from 07/04/2018 in [facility] Cardiac and Pulmonary Rehab  Referring Provider  End      Encounter Date: 07/31/2018  Check In: Session Check In - 07/31/18 0802      Check-In   Supervising physician immediately available to respond to emergencies  See telemetry face sheet for immediately available ER MD    Location  [facility]-Cardiac & Pulmonary Rehab    Staff Present  Alberteen Sam, MA, RCEP, CCRP, Exercise Physiologist;Natika Geyer RCP,RRT,BSRT;Carroll Enterkin, Therapist, sports, BSN    Medication changes reported      No    Fall or balance concerns reported     No    Warm-up and Cool-down  Performed on first and last piece of equipment    Resistance Training Performed  Yes    VAD Patient?  No      Pain Assessment   Currently in Pain?  No/denies          Social History   Tobacco Use  Smoking Status Never Smoker  Smokeless Tobacco Never Used    Goals Met:  Independence with exercise equipment Exercise tolerated well No report of cardiac concerns or symptoms Strength training completed today  Goals Unmet:  Not Applicable  Comments: Pt able to follow exercise prescription today without complaint.  Will continue to monitor for progression.    Dr. Emily Filbert is Medical Director for [place] and [facility].

## 2018-07-31 NOTE — Addendum Note (Signed)
Encounter addended by: Patton Salles, RN on: 07/31/2018 11:07 AM  Actions taken: Charge Capture section accepted

## 2018-07-31 NOTE — Addendum Note (Signed)
Encounter addended by: Patton Salles, RN on: 07/31/2018 11:05 AM  Actions taken: Charge Capture section accepted

## 2018-07-31 NOTE — Research (Signed)
[facility] Visit 7  Pt doing well, no complaints of cp or sob. No changes in her meds.                                     "CONSENT"   YES     NO   Continuing further Investigational Product and study visits for follow-up? [x]  []   Continuing consent from future biomedical research [x]  []                                      "EVENTS"    YES     NO  AE   (IF YES SEE SOURCE) []  [x]   SAE  (IF YES SEE SOURCE) []  [x]   ENDPOINT   (IF YES SEE SOURCE) []  [x]   REVASCULARIZATION  (IF YES SEE SOURCE) []  [x]   AMPUTATION   (IF YES SEE SOURCE) []  [x]   TROPONIN'S  (IF YES SEE SOURCE) []  [x]      Current Outpatient Medications:  .  amiodarone (PACERONE) 200 MG tablet, Take 1 tablet (200 mg total) by mouth daily., Disp: 90 tablet, Rfl: 3 .  aspirin 81 MG chewable tablet, Chew 1 tablet (81 mg total) by mouth daily., Disp: , Rfl:  .  atorvastatin (LIPITOR) 80 MG tablet, Take 1 tablet (80 mg total) by mouth daily at 6 PM., Disp: 90 tablet, Rfl: 1 .  carvedilol (COREG) 6.25 MG tablet, Take 1 tablet (6.25 mg total) by mouth 2 (two) times daily., Disp: 180 tablet, Rfl: 3 .  lisinopril (PRINIVIL,ZESTRIL) 5 MG tablet, Take 1 tablet (5 mg total) by mouth daily., Disp: 30 tablet, Rfl: 2 .  metFORMIN (GLUCOPHAGE) 500 MG tablet, Take 1 tablet (500 mg total) by mouth 2 (two) times daily with a meal. Please have patient to find PCP for next refill., Disp: 60 tablet, Rfl: 0 .  Multiple Vitamin (MULTIVITAMIN WITH MINERALS) TABS, Take 1 tablet by mouth daily., Disp: , Rfl:  .  nitroGLYCERIN (NITROSTAT) 0.4 MG SL tablet, Place 1 tablet (0.4 mg total) under the tongue every 5 (five) minutes as needed., Disp: 25 tablet, Rfl: 2 .  ticagrelor (BRILINTA) 90 MG TABS tablet, Take 1 tablet (90 mg total) by mouth 2 (two) times daily., Disp: 180 tablet, Rfl: 2 No current facility-administered medications for this visit.   Facility-Administered Medications Ordered in Other Visits:  .  0.9 %  sodium chloride infusion, , , Continuous  PRN, End, Christopher, MD, Last Rate: 999 mL/hr at 05/31/18 2102, 999 mL/hr at 05/31/18 2102 .  amiodarone (NEXTERONE PREMIX) 360-4.14 MG/200ML-% (1.8 mg/mL) IV infusion, , , Continuous PRN, End, Christopher, MD, Last Rate: 33.3 mL/hr at 05/31/18 2130, 60 mg/hr at 05/31/18 2130 .  amiodarone (NEXTERONE) 1.8 mg/mL load via infusion, , , PRN, End, Christopher, MD, 150 mg at 05/31/18 2123 .  fentaNYL (SUBLIMAZE) injection, , , PRN, End, Christopher, MD, 25 mcg at 05/31/18 2126 .  heparin injection, , , PRN, End, Harrell Gave, MD, 5,000 Units at 05/31/18 2105 .  lidocaine (PF) (XYLOCAINE) 1 % injection, , , PRN, End, Christopher, MD, 2 mL at 05/31/18 2058 .  midazolam (VERSED) injection, , , PRN, End, Christopher, MD, 1 mg at 05/31/18 2126 .  Radial Cocktail/Verapamil only, , , PRN, End, Christopher, MD, 10 mL at 05/31/18 2059

## 2018-07-31 NOTE — Addendum Note (Signed)
Encounter addended by: Patton Salles, RN on: 07/31/2018 11:06 AM  Actions taken: Charge Capture section accepted

## 2018-08-01 ENCOUNTER — Ambulatory Visit (HOSPITAL_COMMUNITY): Payer: Medicare Other

## 2018-08-05 ENCOUNTER — Encounter: Payer: Medicare Other | Admitting: *Deleted

## 2018-08-05 DIAGNOSIS — I10 Essential (primary) hypertension: Secondary | ICD-10-CM | POA: Diagnosis not present

## 2018-08-05 DIAGNOSIS — I213 ST elevation (STEMI) myocardial infarction of unspecified site: Secondary | ICD-10-CM

## 2018-08-05 DIAGNOSIS — Z955 Presence of coronary angioplasty implant and graft: Secondary | ICD-10-CM | POA: Diagnosis not present

## 2018-08-05 DIAGNOSIS — I255 Ischemic cardiomyopathy: Secondary | ICD-10-CM | POA: Diagnosis not present

## 2018-08-05 NOTE — Progress Notes (Signed)
Daily Session Note  Patient Details  Name: Lindsey Pierce MRN: [id number] Date of Birth: 04/26/1951 Referring Provider:     Cardiac Rehab from 07/04/2018 in [facility] Cardiac and Pulmonary Rehab  Referring Provider  End      Encounter Date: 08/05/2018  Check In: Session Check In - 08/05/18 0755      Check-In   Supervising physician immediately available to respond to emergencies  See telemetry face sheet for immediately available ER MD    Location  [facility]-Cardiac & Pulmonary Rehab    Staff Present  Alberteen Sam, MA, RCEP, CCRP, Exercise Physiologist;Reshad Saab Amedeo Plenty, BS, ACSM CEP, Exercise Physiologist;Susanne Bice, RN, BSN, CCRP    Medication changes reported      No    Fall or balance concerns reported     No    Tobacco Cessation  No Change    Warm-up and Cool-down  Performed on first and last piece of equipment    Resistance Training Performed  Yes    VAD Patient?  No    PAD/SET Patient?  No      Pain Assessment   Currently in Pain?  No/denies    Multiple Pain Sites  No          Social History   Tobacco Use  Smoking Status Never Smoker  Smokeless Tobacco Never Used    Goals Met:  Independence with exercise equipment Exercise tolerated well No report of cardiac concerns or symptoms Strength training completed today  Goals Unmet:  Not Applicable  Comments: Pt able to follow exercise prescription today without complaint.  Will continue to monitor for progression.    Dr. Emily Filbert is Medical Director for [place] and [facility].

## 2018-08-07 ENCOUNTER — Ambulatory Visit (HOSPITAL_COMMUNITY): Payer: Medicare Other

## 2018-08-07 DIAGNOSIS — I10 Essential (primary) hypertension: Secondary | ICD-10-CM | POA: Diagnosis not present

## 2018-08-07 DIAGNOSIS — I213 ST elevation (STEMI) myocardial infarction of unspecified site: Secondary | ICD-10-CM

## 2018-08-07 DIAGNOSIS — I255 Ischemic cardiomyopathy: Secondary | ICD-10-CM | POA: Diagnosis not present

## 2018-08-07 DIAGNOSIS — Z955 Presence of coronary angioplasty implant and graft: Secondary | ICD-10-CM | POA: Diagnosis not present

## 2018-08-07 NOTE — Progress Notes (Signed)
Daily Session Note  Patient Details  Name: Lindsey Pierce MRN: [id number] Date of Birth: [birth year] Referring Provider:     Cardiac Rehab from 07/04/2018 in [facility] Cardiac and Pulmonary Rehab  Referring Provider  End      Encounter Date: 08/07/2018  Check In: Session Check In - 08/07/18 0730      Check-In   Supervising physician immediately available to respond to emergencies  See telemetry face sheet for immediately available ER MD    Location  [facility]-Cardiac & Pulmonary Rehab    Staff Present  Alberteen Sam, MA, RCEP, CCRP, Exercise Physiologist;Arhianna Ebey RCP,RRT,BSRT;Heath Lark, RN, BSN, CCRP    Medication changes reported      No    Fall or balance concerns reported     No    Warm-up and Cool-down  Performed on first and last piece of equipment    Resistance Training Performed  Yes    VAD Patient?  No      Pain Assessment   Currently in Pain?  No/denies          Social History   Tobacco Use  Smoking Status Never Smoker  Smokeless Tobacco Never Used    Goals Met:  Independence with exercise equipment Exercise tolerated well No report of cardiac concerns or symptoms Strength training completed today  Goals Unmet:  Not Applicable  Comments: Pt able to follow exercise prescription today without complaint.  Will continue to monitor for progression.    Dr. Emily Filbert is Medical Director for [place] and [facility].

## 2018-08-08 ENCOUNTER — Other Ambulatory Visit
Admission: RE | Admit: 2018-08-08 | Discharge: 2018-08-08 | Disposition: A | Discharge status: Home / Self Care | Payer: Medicare Other | Source: Ambulatory Visit | Attending: Nurse Practitioner | Admitting: Nurse Practitioner

## 2018-08-08 DIAGNOSIS — E785 Hyperlipidemia, unspecified: Secondary | ICD-10-CM

## 2018-08-08 LAB — HEPATIC FUNCTION PANEL
ALBUMIN: 4.4 g/dL (ref 3.5–5.0)
ALT: 31 U/L (ref 0–44)
AST: 27 U/L (ref 15–41)
Alkaline Phosphatase: 62 U/L (ref 38–126)
Bilirubin, Direct: 0.2 mg/dL (ref 0.0–0.2)
Indirect Bilirubin: 0.6 mg/dL (ref 0.3–0.9)
TOTAL PROTEIN: 8 g/dL (ref 6.5–8.1)
Total Bilirubin: 0.8 mg/dL (ref 0.3–1.2)

## 2018-08-08 LAB — LIPID PANEL
CHOL/HDL RATIO: 4.1 ratio
Cholesterol: 118 mg/dL (ref 0–200)
HDL: 29 mg/dL — AB (ref >40)
LDL CALC: 62 mg/dL (ref 0–99)
TRIGLYCERIDES: 135 mg/dL (ref <150)
VLDL: 27 mg/dL (ref 0–40)

## 2018-08-09 ENCOUNTER — Ambulatory Visit (HOSPITAL_COMMUNITY): Payer: Medicare Other

## 2018-08-09 ENCOUNTER — Telehealth: Payer: Self-pay | Admitting: Nurse Practitioner

## 2018-08-09 DIAGNOSIS — I255 Ischemic cardiomyopathy: Secondary | ICD-10-CM | POA: Diagnosis not present

## 2018-08-09 DIAGNOSIS — I10 Essential (primary) hypertension: Secondary | ICD-10-CM | POA: Diagnosis not present

## 2018-08-09 DIAGNOSIS — Z955 Presence of coronary angioplasty implant and graft: Secondary | ICD-10-CM | POA: Diagnosis not present

## 2018-08-09 DIAGNOSIS — I213 ST elevation (STEMI) myocardial infarction of unspecified site: Secondary | ICD-10-CM

## 2018-08-09 NOTE — Telephone Encounter (Signed)
Patient returning our call for lab work   Please call back

## 2018-08-09 NOTE — Progress Notes (Signed)
Daily Session Note  Patient Details  Name: Lindsey Pierce MRN: [id number] Date of Birth: [birth year] Referring Provider:     Cardiac Rehab from 07/04/2018 in [facility] Cardiac and Pulmonary Rehab  Referring Provider  End      Encounter Date: 08/09/2018  Check In:      Social History   Tobacco Use  Smoking Status Never Smoker  Smokeless Tobacco Never Used    Goals Met:  Independence with exercise equipment Exercise tolerated well No report of cardiac concerns or symptoms Strength training completed today  Goals Unmet:  Not Applicable  Comments: Pt able to follow exercise prescription today without complaint.  Will continue to monitor for progression.    Dr. Emily Filbert is Medical Director for [place] and [facility].

## 2018-08-09 NOTE — Telephone Encounter (Signed)
Pt aware of lab results ./cy 

## 2018-08-12 ENCOUNTER — Encounter: Payer: Medicare Other | Admitting: *Deleted

## 2018-08-12 ENCOUNTER — Ambulatory Visit (HOSPITAL_COMMUNITY): Payer: Medicare Other

## 2018-08-12 DIAGNOSIS — I255 Ischemic cardiomyopathy: Secondary | ICD-10-CM | POA: Diagnosis not present

## 2018-08-12 DIAGNOSIS — I213 ST elevation (STEMI) myocardial infarction of unspecified site: Secondary | ICD-10-CM

## 2018-08-12 DIAGNOSIS — I10 Essential (primary) hypertension: Secondary | ICD-10-CM | POA: Diagnosis not present

## 2018-08-12 DIAGNOSIS — Z955 Presence of coronary angioplasty implant and graft: Secondary | ICD-10-CM | POA: Diagnosis not present

## 2018-08-12 NOTE — Progress Notes (Signed)
Daily Session Note  Patient Details  Name: Lindsey Pierce MRN: [id number] Date of Birth: 10/24/1951 Referring Provider:     Cardiac Rehab from 07/04/2018 in [facility] Cardiac and Pulmonary Rehab  Referring Provider  End      Encounter Date: 08/12/2018  Check In: Session Check In - 08/12/18 0752      Check-In   Supervising physician immediately available to respond to emergencies  See telemetry face sheet for immediately available ER MD    Location  [facility]-Cardiac & Pulmonary Rehab    Staff Present  Alberteen Sam, MA, RCEP, CCRP, Exercise Physiologist;Ralene Gasparyan Amedeo Plenty, BS, ACSM CEP, Exercise Physiologist;Susanne Bice, RN, BSN, CCRP    Medication changes reported      No    Fall or balance concerns reported     No    Tobacco Cessation  No Change    Warm-up and Cool-down  Performed on first and last piece of equipment    Resistance Training Performed  Yes    VAD Patient?  No    PAD/SET Patient?  No      Pain Assessment   Currently in Pain?  No/denies    Multiple Pain Sites  No          Social History   Tobacco Use  Smoking Status Never Smoker  Smokeless Tobacco Never Used    Goals Met:  Independence with exercise equipment Exercise tolerated well No report of cardiac concerns or symptoms Strength training completed today  Goals Unmet:  Not Applicable  Comments: Pt able to follow exercise prescription today without complaint.  Will continue to monitor for progression.    Dr. Emily Filbert is Medical Director for [place] and [facility].

## 2018-08-14 ENCOUNTER — Ambulatory Visit (HOSPITAL_COMMUNITY): Payer: Medicare Other

## 2018-08-14 ENCOUNTER — Encounter: Payer: Medicare Other | Attending: Cardiovascular Disease

## 2018-08-14 DIAGNOSIS — I10 Essential (primary) hypertension: Secondary | ICD-10-CM | POA: Insufficient documentation

## 2018-08-14 DIAGNOSIS — Z955 Presence of coronary angioplasty implant and graft: Secondary | ICD-10-CM | POA: Insufficient documentation

## 2018-08-14 DIAGNOSIS — I213 ST elevation (STEMI) myocardial infarction of unspecified site: Secondary | ICD-10-CM

## 2018-08-14 DIAGNOSIS — I255 Ischemic cardiomyopathy: Secondary | ICD-10-CM | POA: Insufficient documentation

## 2018-08-14 NOTE — Progress Notes (Signed)
Daily Session Note  Patient Details  Name: Lindsey Pierce MRN: [id number] Date of Birth: July 28, 1951 Referring Provider:     Cardiac Rehab from 07/04/2018 in [facility] Cardiac and Pulmonary Rehab  Referring Provider  End      Encounter Date: 08/14/2018  Check In: Session Check In - 08/14/18 0744      Check-In   Supervising physician immediately available to respond to emergencies  See telemetry face sheet for immediately available ER MD    Location  [facility]-Cardiac & Pulmonary Rehab    Staff Present  Alberteen Sam, MA, RCEP, CCRP, Exercise Physiologist;Jasir Rother [place];Heath Lark, RN, BSN, CCRP    Medication changes reported      No    Fall or balance concerns reported     No    Warm-up and Cool-down  Performed on first and last piece of equipment    Resistance Training Performed  Yes    VAD Patient?  No    PAD/SET Patient?  No      Pain Assessment   Currently in Pain?  No/denies          Social History   Tobacco Use  Smoking Status Never Smoker  Smokeless Tobacco Never Used    Goals Met:  Independence with exercise equipment Exercise tolerated well No report of cardiac concerns or symptoms Strength training completed today  Goals Unmet:  Not Applicable  Comments: Pt able to follow exercise prescription today without complaint.  Will continue to monitor for progression.    Dr. Emily Filbert is Medical Director for [place] and [facility].

## 2018-08-15 ENCOUNTER — Telehealth: Payer: Self-pay | Admitting: Internal Medicine

## 2018-08-15 NOTE — Telephone Encounter (Signed)
Patient recently had Mi and was told to call or be seen if she had any symptoms.  Now c/o back pain 4-5 times in 30 minutes under shoulder pain on left hand side.  Patient concerned she may be having more cardiac symptoms like with last episode .  Please call.

## 2018-08-15 NOTE — Telephone Encounter (Signed)
Called patient. She is no longer having any discomfort at this time. SHe woke up this mrning with a pain in her back underneath her left shoulder blade. She described it as a "twinge" that was on and off for 30 minutes.  Denies having dizziness, nausea, vomiting, left arm pain or jaw pain. Blood sugar was 115. BP was 120/68, HR 58. Patient was nervous and wanted to make sure of what she should do. Patient feels better now and no discomfort.  Advised patient to continue to monitor and call us back if it happen again. Pt verbalized understanding to call 911 or go to the emergency room, if he develops any new or worsening symptoms. Patient was very Patent attorney.

## 2018-08-15 NOTE — Research (Signed)
Open in error

## 2018-08-16 ENCOUNTER — Ambulatory Visit (HOSPITAL_COMMUNITY): Payer: Medicare Other

## 2018-08-19 ENCOUNTER — Ambulatory Visit (HOSPITAL_COMMUNITY): Payer: Medicare Other

## 2018-08-21 ENCOUNTER — Ambulatory Visit (HOSPITAL_COMMUNITY): Payer: Medicare Other

## 2018-08-21 ENCOUNTER — Encounter: Payer: Self-pay | Admitting: *Deleted

## 2018-08-21 DIAGNOSIS — I255 Ischemic cardiomyopathy: Secondary | ICD-10-CM | POA: Diagnosis not present

## 2018-08-21 DIAGNOSIS — Z955 Presence of coronary angioplasty implant and graft: Secondary | ICD-10-CM | POA: Diagnosis not present

## 2018-08-21 DIAGNOSIS — I213 ST elevation (STEMI) myocardial infarction of unspecified site: Secondary | ICD-10-CM

## 2018-08-21 DIAGNOSIS — I10 Essential (primary) hypertension: Secondary | ICD-10-CM | POA: Diagnosis not present

## 2018-08-21 NOTE — Progress Notes (Signed)
Daily Session Note  Patient Details  Name: Lindsey Pierce MRN: [id number] Date of Birth: January 25, 1951 Referring Provider:     Cardiac Rehab from 07/04/2018 in [facility] Cardiac and Pulmonary Rehab  Referring Provider  End      Encounter Date: 08/21/2018  Check In: Session Check In - 08/21/18 0730      Check-In   Supervising physician immediately available to respond to emergencies  See telemetry face sheet for immediately available ER MD    Location  [facility]-Cardiac & Pulmonary Rehab    Staff Present  Alberteen Sam, MA, RCEP, CCRP, Exercise Physiologist;Myeshia Fojtik RCP,RRT,BSRT;Heath Lark, RN, BSN, CCRP    Medication changes reported      No    Fall or balance concerns reported     No    Warm-up and Cool-down  Performed on first and last piece of equipment    Resistance Training Performed  Yes    VAD Patient?  No    PAD/SET Patient?  No      Pain Assessment   Currently in Pain?  No/denies          Social History   Tobacco Use  Smoking Status Never Smoker  Smokeless Tobacco Never Used    Goals Met:  Independence with exercise equipment Exercise tolerated well No report of cardiac concerns or symptoms Strength training completed today  Goals Unmet:  Not Applicable  Comments: Pt able to follow exercise prescription today without complaint.  Will continue to monitor for progression.    Dr. Emily Filbert is Medical Director for [place] and [facility].

## 2018-08-21 NOTE — Progress Notes (Signed)
Cardiac Individual Treatment Plan  Patient Details  Name: Lindsey Pierce MRN: [id number] Date of Birth: 12/02/1950 Referring Provider:     Cardiac Rehab from 07/04/2018 in [facility] Cardiac and Pulmonary Rehab  Referring Provider  End      Initial Encounter Date:    Cardiac Rehab from 07/04/2018 in [facility] Cardiac and Pulmonary Rehab  Date  07/04/18      Visit Diagnosis: ST elevation myocardial infarction (STEMI), unspecified artery ([place])  Patient's Home Medications on Admission:  Current Outpatient Medications:  .  amiodarone (PACERONE) 200 MG tablet, Take 1 tablet (200 mg total) by mouth daily., Disp: 90 tablet, Rfl: 3 .  aspirin 81 MG chewable tablet, Chew 1 tablet (81 mg total) by mouth daily., Disp: , Rfl:  .  atorvastatin (LIPITOR) 80 MG tablet, Take 1 tablet (80 mg total) by mouth daily at 6 PM., Disp: 90 tablet, Rfl: 1 .  carvedilol (COREG) 6.25 MG tablet, Take 1 tablet (6.25 mg total) by mouth 2 (two) times daily., Disp: 180 tablet, Rfl: 3 .  lisinopril (PRINIVIL,ZESTRIL) 5 MG tablet, Take 1 tablet (5 mg total) by mouth daily., Disp: 30 tablet, Rfl: 2 .  metFORMIN (GLUCOPHAGE) 500 MG tablet, Take 1 tablet (500 mg total) by mouth 2 (two) times daily with a meal. Please have patient to find PCP for next refill., Disp: 60 tablet, Rfl: 0 .  Multiple Vitamin (MULTIVITAMIN WITH MINERALS) TABS, Take 1 tablet by mouth daily., Disp: , Rfl:  .  nitroGLYCERIN (NITROSTAT) 0.4 MG SL tablet, Place 1 tablet (0.4 mg total) under the tongue every 5 (five) minutes as needed., Disp: 25 tablet, Rfl: 2 .  ticagrelor (BRILINTA) 90 MG TABS tablet, Take 1 tablet (90 mg total) by mouth 2 (two) times daily., Disp: 180 tablet, Rfl: 2 No current facility-administered medications for this visit.   Facility-Administered Medications Ordered in Other Visits:  .  0.9 %  sodium chloride infusion, , , Continuous PRN, End, Christopher, MD, Last Rate: 999 mL/hr at 05/31/18 2102, 999 mL/hr at 05/31/18 2102 .  amiodarone  (NEXTERONE PREMIX) 360-4.14 MG/200ML-% (1.8 mg/mL) IV infusion, , , Continuous PRN, End, Christopher, MD, Last Rate: 33.3 mL/hr at 05/31/18 2130, 60 mg/hr at 05/31/18 2130 .  amiodarone (NEXTERONE) 1.8 mg/mL load via infusion, , , PRN, End, Christopher, MD, 150 mg at 05/31/18 2123 .  fentaNYL (SUBLIMAZE) injection, , , PRN, End, Christopher, MD, 25 mcg at 05/31/18 2126 .  heparin injection, , , PRN, End, Harrell Gave, MD, 5,000 Units at 05/31/18 2105 .  lidocaine (PF) (XYLOCAINE) 1 % injection, , , PRN, End, Christopher, MD, 2 mL at 05/31/18 2058 .  midazolam (VERSED) injection, , , PRN, End, Christopher, MD, 1 mg at 05/31/18 2126 .  Radial Cocktail/Verapamil only, , , PRN, End, Christopher, MD, 10 mL at 05/31/18 2059  Past Medical History: Past Medical History:  Diagnosis Date  . CAD (coronary artery disease)    a. 05/2018 Ant STEMI/PCI: LM nl, LAD 37m2.75 x 16 Synergy DES), D2 99 (PTCA), RI 40ost, LCX mild diff dzs, OM1/2/3 nl, RCA large, diffuse dzs, RPDA nl, RPAV 60, RPLB1 80 (small).  . Diabetes ([place]   . HFrEF (heart failure with reduced ejection fraction) ([place]    a. 05/2018 Echo: EF 40-45%, mod LVH, Gr2 DD, mid antsept, apical septal, apical AK. Mid-apical ant, mid infsept HK. Nl RV fxn. Triv TR.   .Marland KitchenHyperlipidemia   . Hypertension   . Ischemic cardiomyopathy    a. 05/2018 Echo: EF 40-45%.  .Marland Kitchen  PAF (paroxysmal atrial fibrillation) ([place])    a. 05/2018 at time of STEMI, converted on Amio. No OAC.  Marland Kitchen STEMI involving left anterior descending coronary artery ([place])    a. 0/1749 - complicated by CGS req IABP and VT req amiodarone.  . V-tach ([place])    a. 05/2018 at time of Ant STEMI-->Amio.    Tobacco Use: Social History   Tobacco Use  Smoking Status Never Smoker  Smokeless Tobacco Never Used    Labs: Recent Review Flowsheet Data    Labs for ITP Cardiac and Pulmonary Rehab Latest Ref Rng & Units 05/31/2018 05/31/2018 06/02/2018 06/04/2018 08/08/2018   Cholestrol 0 - 200 mg/dL 198 - - - 118    LDLCALC 0 - 99 mg/dL 134(H) - - - 62   HDL >40 mg/dL 30(L) - - - 29(L)   Trlycerides <150 mg/dL 170(H) - - - 135   Hemoglobin A1c 4.8 - 5.6 % 7.3(H) - - 7.1(H) -   PHART 7.350 - 7.450 - 7.299(L) - - -   PCO2ART 32.0 - 48.0 mmHg - 48.3(H) - - -   HCO3 20.0 - 28.0 mmol/L - 23.7 - - -   TCO2 22 - 32 mmol/L - 25 - - -   ACIDBASEDEF 0.0 - 2.0 mmol/L - 3.0(H) - - -   O2SAT % - 97.0 62.2 - -       Exercise Target Goals: Exercise Program Goal: Individual exercise prescription set using results from initial 6 min walk test and THRR while considering  patient's activity barriers and safety.   Exercise Prescription Goal: Initial exercise prescription builds to 30-45 minutes a day of aerobic activity, 2-3 days per week.  Home exercise guidelines will be given to patient during program as part of exercise prescription that the participant will acknowledge.  Activity Barriers & Risk Stratification: Activity Barriers & Cardiac Risk Stratification - 07/04/18 1312      Activity Barriers & Cardiac Risk Stratification   Activity Barriers  Other (comment)    Comments  inner ear issues, sometimes has issues bending down     Cardiac Risk Stratification  High       6 Minute Walk: 6 Minute Walk    Row Name 07/04/18 1436         6 Minute Walk   Distance  980 feet     Walk Time  6 minutes     # of Rest Breaks  0     MPH  1.85     METS  2.06     RPE  12     Perceived Dyspnea   0     VO2 Peak  7.22     Symptoms  No     Resting HR  64 bpm     Resting BP  114/62     Resting Oxygen Saturation   96 %     Exercise Oxygen Saturation  during 6 min walk  97 %     Max Ex. HR  115 bpm     Max Ex. BP  154/74     2 Minute Post BP  120/74        Oxygen Initial Assessment:   Oxygen Re-Evaluation:   Oxygen Discharge (Final Oxygen Re-Evaluation):   Initial Exercise Prescription: Initial Exercise Prescription - 07/04/18 1400      Date of Initial Exercise RX and Referring Provider   Date   07/04/18    Referring Provider  End      Treadmill  MPH  1.5    Grade  0    Minutes  15    METs  2.15      Recumbant Bike   Level  1    RPM  60    Minutes  15    METs  2      NuStep   Level  2    SPM  80    Minutes  15    METs  2      Prescription Details   Frequency (times per week)  3    Duration  Progress to 45 minutes of aerobic exercise without signs/symptoms of physical distress      Intensity   THRR 40-80% of Max Heartrate  99-135    Ratings of Perceived Exertion  11-13    Perceived Dyspnea  0-4      Resistance Training   Training Prescription  Yes    Weight  3 lb    Reps  10-15       Perform Capillary Blood Glucose checks as needed.  Exercise Prescription Changes: Exercise Prescription Changes    Row Name 07/04/18 1300 07/12/18 0800 07/17/18 0900 07/29/18 1500 08/12/18 1400     Response to Exercise   Blood Pressure (Admit)  114/62  -  146/74  138/64  128/60   Blood Pressure (Exercise)  154/74  -  138/78  162/80  138/64   Blood Pressure (Exit)  120/74  -  126/67  126/64  120/60   Heart Rate (Admit)  66 bpm  -  69 bpm  62 bpm  65 bpm   Heart Rate (Exercise)  115 bpm  -  109 bpm  116 bpm  118 bpm   Heart Rate (Exit)  90 bpm  -  71 bpm  70 bpm  71 bpm   Oxygen Saturation (Admit)  96 %  -  -  -  -   Oxygen Saturation (Exercise)  96 %  -  -  -  -   Rating of Perceived Exertion (Exercise)  12  -  _0 Perceived Dyspnea (Exercise)  0  -  -  -  -   Symptoms  -  -  none  none  none   Duration  -  -  Continue with 45 min of aerobic exercise without signs/symptoms of physical distress.  Continue with 45 min of aerobic exercise without signs/symptoms of physical distress.  Continue with 45 min of aerobic exercise without signs/symptoms of physical distress.   Intensity  -  -  THRR unchanged  THRR unchanged  THRR unchanged     Progression   Progression  -  -  Continue to progress workloads to maintain intensity without signs/symptoms of physical distress.   Continue to progress workloads to maintain intensity without signs/symptoms of physical distress.  Continue to progress workloads to maintain intensity without signs/symptoms of physical distress.   Average METs  -  -  2.02  2.38  2.93     Resistance Training   Training Prescription  -  -  Yes  Yes  Yes   Weight  -  -  3 lbs  3 lbs  3 lbs   Reps  -  -  10-15  10-15  10-15     Interval Training   Interval Training  -  -  No  No  No     Treadmill   MPH  -  -  1.5  1.7  1.9   Grade  -  -  0  1  2   Minutes  -  -  _0 METs  -  -  2.15  2.54  2.98     NuStep   Level  -  -  _1 Minutes  -  -  _2 METs  -  -  2.4  2.6  3.4     Arm Ergometer   Level  -  -  1  3  6.5   Minutes  -  -  _3 METs  -  -  1.5  2  2.4     Home Exercise Plan   Plans to continue exercise at  -  Home (comment) walking  Home (comment) walking  Home (comment) walking  Home (comment) walking   Frequency  -  Add 2 additional days to program exercise sessions.  Add 2 additional days to program exercise sessions.  Add 2 additional days to program exercise sessions.  Add 2 additional days to program exercise sessions.   Initial Home Exercises Provided  -  07/12/18  07/12/18  07/12/18  07/12/18      Exercise Comments: Exercise Comments    Row Name 07/08/18 0801           Exercise Comments   First full day of exercise!  Patient was oriented to gym and equipment including functions, settings, policies, and procedures.  Patient's individual exercise prescription and treatment plan were reviewed.  All starting workloads were established based on the results of the 6 minute walk test done at initial orientation visit.  The plan for exercise progression was also introduced and progression will be customized based on patient's performance and goals.          Exercise Goals and Review: Exercise Goals    Row Name 07/04/18 1434             Exercise Goals   Increase Physical Activity   Yes       Intervention  Provide advice, education, support and counseling about physical activity/exercise needs.;Develop an individualized exercise prescription for aerobic and resistive training based on initial evaluation findings, risk stratification, comorbidities and participant's personal goals.       Expected Outcomes  Short Term: Attend rehab on a regular basis to increase amount of physical activity.;Long Term: Add in home exercise to make exercise part of routine and to increase amount of physical activity.;Long Term: Exercising regularly at least 3-5 days a week.       Increase Strength and Stamina  Yes       Intervention  Provide advice, education, support and counseling about physical activity/exercise needs.;Develop an individualized exercise prescription for aerobic and resistive training based on initial evaluation findings, risk stratification, comorbidities and participant's personal goals.       Expected Outcomes  Short Term: Increase workloads from initial exercise prescription for resistance, speed, and METs.;Short Term: Perform resistance training exercises routinely during rehab and add in resistance training at home;Long Term: Improve cardiorespiratory fitness, muscular endurance and strength as measured by increased METs and functional capacity (6MWT)       Able to understand and use rate of perceived exertion (RPE) scale  Yes       Intervention  Provide education and explanation on how to use RPE scale  Expected Outcomes  Short Term: Able to use RPE daily in rehab to express subjective intensity level;Long Term:  Able to use RPE to guide intensity level when exercising independently       Able to understand and use Dyspnea scale  Yes       Intervention  Provide education and explanation on how to use Dyspnea scale       Expected Outcomes  Short Term: Able to use Dyspnea scale daily in rehab to express subjective sense of shortness of breath during exertion;Long Term: Able  to use Dyspnea scale to guide intensity level when exercising independently       Knowledge and understanding of Target Heart Rate Range (THRR)  Yes       Intervention  Provide education and explanation of THRR including how the numbers were predicted and where they are located for reference       Expected Outcomes  Short Term: Able to state/look up THRR;Short Term: Able to use daily as guideline for intensity in rehab;Long Term: Able to use THRR to govern intensity when exercising independently       Able to check pulse independently  Yes       Intervention  Provide education and demonstration on how to check pulse in carotid and radial arteries.;Review the importance of being able to check your own pulse for safety during independent exercise       Expected Outcomes  Short Term: Able to explain why pulse checking is important during independent exercise;Long Term: Able to check pulse independently and accurately       Understanding of Exercise Prescription  Yes       Intervention  Provide education, explanation, and written materials on patient's individual exercise prescription       Expected Outcomes  Short Term: Able to explain program exercise prescription;Long Term: Able to explain home exercise prescription to exercise independently          Exercise Goals Re-Evaluation : Exercise Goals Re-Evaluation    Row Name 07/08/18 0802 07/12/18 0834 07/17/18 0946 07/29/18 0831 08/12/18 1426     Exercise Goal Re-Evaluation   Exercise Goals Review  Increase Physical Activity;Increase Strength and Stamina;Able to understand and use rate of perceived exertion (RPE) scale;Knowledge and understanding of Target Heart Rate Range (THRR);Understanding of Exercise Prescription  Increase Physical Activity;Increase Strength and Stamina;Able to understand and use rate of perceived exertion (RPE) scale;Knowledge and understanding of Target Heart Rate Range (THRR);Able to check pulse independently;Understanding of  Exercise Prescription  Increase Physical Activity;Increase Strength and Stamina;Understanding of Exercise Prescription  Increase Physical Activity;Increase Strength and Stamina;Understanding of Exercise Prescription  Increase Physical Activity;Increase Strength and Stamina;Understanding of Exercise Prescription   Comments  Reviewed RPE scale, THR and program prescription with pt today.  Pt voiced understanding and was given a copy of goals to take home.   Reviewed home exercise with pt today.  Pt plans to walk at home for exercise.  Reviewed THR, pulse, RPE, sign and symptoms, NTG use, and when to call 911 or MD.  Also discussed weather considerations and indoor options.  Pt voiced understanding.  Susie is off to a good start in rehab.  She is already up to level 4 on the NuStep.  We will continue to monitor her progression.   Susie is doing well in rehab.  Her biggest hinderance is the treadmill, it is her hardest piece but she has been able to move up consistently.  She is exercising at home by walking  and doing weights.  She goes for 8mn in the early.  We  talked about increase to 389m of walking.   Susie continues to do well in rehab. She continues to try to conquer the treadmill.  She is getting there step by step as she is now up to 1.9 mph.  She is also doing level 6.5 on the arm crank already!!  We will continue to monitor her progress.    Expected Outcomes  Short: Use RPE daily to regulate intensity. Long: Follow program prescription in THR.  Short: Start adding more walking at home, up to 30 min.  Long: Start to tackle her hill!!  Short: Continue to attend rehab regularly.  Long: Continue to improve strength and stamina.   Short: Increase walking time to 3078m  Long: Continue to increase activity levels.   Short: Continue to increase treadmill.  Long: Continue to walk more at home.       Discharge Exercise Prescription (Final Exercise Prescription Changes): Exercise Prescription Changes -  08/12/18 1400      Response to Exercise   Blood Pressure (Admit)  128/60    Blood Pressure (Exercise)  138/64    Blood Pressure (Exit)  120/60    Heart Rate (Admit)  65 bpm    Heart Rate (Exercise)  118 bpm    Heart Rate (Exit)  71 bpm    Rating of Perceived Exertion (Exercise)  12    Symptoms  none    Duration  Continue with 45 min of aerobic exercise without signs/symptoms of physical distress.    Intensity  THRR unchanged      Progression   Progression  Continue to progress workloads to maintain intensity without signs/symptoms of physical distress.    Average METs  2.93      Resistance Training   Training Prescription  Yes    Weight  3 lbs    Reps  10-15      Interval Training   Interval Training  No      Treadmill   MPH  1.9    Grade  2    Minutes  15    METs  2.98      NuStep   Level  6    Minutes  15    METs  3.4      Arm Ergometer   Level  6.5    Minutes  15    METs  2.4      Home Exercise Plan   Plans to continue exercise at  Home (comment)   walking   Frequency  Add 2 additional days to program exercise sessions.    Initial Home Exercises Provided  07/12/18       Nutrition:  Target Goals: Understanding of nutrition guidelines, daily intake of sodium <1500m23mholesterol <200mg1mlories 30% from fat and 7% or less from saturated fats, daily to have 5 or more servings of fruits and vegetables.  Biometrics: Pre Biometrics - 07/04/18 1433      Pre Biometrics   Height  _0  (1.575 m)    Weight  225 lb 11.2 oz (102.4 kg)    Waist Circumference  44 inches    Hip Circumference  55.25 inches    Waist to Hip Ratio  0.8 %    BMI (Calculated)  41.27    Single Leg Stand  1.5 seconds        Nutrition Therapy Plan and Nutrition Goals: Nutrition Therapy & Goals - 07/08/18 [id number]  Nutrition Therapy   Diet  DASH    Drug/Food Interactions  Statins/Certain Fruits    Protein (specify units)  9oz    Fiber  20 grams    Whole Grain Foods  3 servings    eats whole grain bread   Saturated Fats  12 max. grams    Fruits and Vegetables  5 servings/day   8 ideal, working to increase fruit & vegetable intake   Sodium  1500 grams      Personal Nutrition Goals   Nutrition Goal  Continue to work on making low sodium, fat and sugar food and beverage choices. Great job so far!    Personal Goal #2  When choosing plant-based protein options, make sure to check the sodium and fat content. Just because it's meat-free does not make it a heart healthy choice    Personal Goal #3  Try warming up frozen fruit or adding stevia/brown sugar blend to oatmeal, rather than adding regular brown sugar, to decrease the sugar content of this meal    Comments  She has made dramatic changes to her diet since hospitalization. 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   General guidelines for heart healthy eating handout, sodium free seasoning blend sheet, low sodium nutrition therapy handout   Expected Outcomes  Short Term Goal: A plan has been developed with personal nutrition goals set during dietitian appointment.;Long Term Goal: Adherence to prescribed nutrition plan.;Short Term Goal: Understand basic principles of dietary content, such as calories, fat, sodium, cholesterol and nutrients.       Nutrition Assessments: Nutrition Assessments - 07/04/18 1312      MEDFICTS Scores   Pre Score  6       Nutrition Goals Re-Evaluation: Nutrition Goals  Re-Evaluation    Row Name 07/08/18 0920 08/05/18 0918           Goals   Nutrition Goal  Continue to work on making low sodium, fat and sugar food and beverage choices. Great job so far!   Continue to work on making low sodium, sugar and fat choices, great job! For example, warm up frozen fruit to top oatmeal with. Remember to monitor sodium even on plant-based options      Comment  She reports to be eating less than 1557m of sodium per day, chooses zero sugar beverages and low sugar snacks, and has cut out fatty meats. She is no longer eating canned items or most packaged foods. She is being much more mindful about her diet overall  She has been trying to stay below 12066msodium/day and has been monitoring sugar and fat intake. Total wt loss of 30# since July 2019. She has been eating peaches or whole wheat/low sodium Triscuits and hummus as snacks. She also puchased a water infuser to flavor beverages. She is not always hungry early in the morning      Expected Outcome  She will continue to educate herself on low sodium, sugar and fat options, continue to lose weight, and improve her  overall health as desired  She will continue to implement current diet/ lifestyle changes. She plans to try Premier Protein shakes as a breakfast option when she has to get up early and is not hungry.        Personal Goal #2 Re-Evaluation   Personal Goal #2  When choosing plant-based protein options, make sure to check the sodium and fat content. Just because it's meat-free does not make it a heart healthy choice  -        Personal Goal #3 Re-Evaluation   Personal Goal #3  Try warming up frozen fruit or adding stevia/brown sugar blend to oatmeal, rather than adding regular brown sugar, to decrease the sugar content of this meal  -         Nutrition Goals Discharge (Final Nutrition Goals Re-Evaluation): Nutrition Goals Re-Evaluation - 08/05/18 0918      Goals   Nutrition Goal  Continue to work on making low  sodium, sugar and fat choices, great job! For example, warm up frozen fruit to top oatmeal with. Remember to monitor sodium even on plant-based options    Comment  She has been trying to stay below 1268m sodium/day and has been monitoring sugar and fat intake. Total wt loss of 30# since July 2019. She has been eating peaches or whole wheat/low sodium Triscuits and hummus as snacks. She also puchased a water infuser to flavor beverages. She is not always hungry early in the morning    Expected Outcome  She will continue to implement current diet/ lifestyle changes. She plans to try Premier Protein shakes as a breakfast option when she has to get up early and is not hungry.       Psychosocial: Target Goals: Acknowledge presence or absence of significant depression and/or stress, maximize coping skills, provide positive support system. Participant is able to verbalize types and ability to use techniques and skills needed for reducing stress and depression.   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 She has anxiety about her heart and how she will feel while exercising. She has started walking at home. She feels like this is her "second chance at life" after her heart attack and is trying to make changes. She is very motivated to begin this "heart healthy lifestyle" and reported feeling a lot more confident in herself after orientation.       Family Dynamics   Good Support System?  Yes   Family, Friends, [place], neighbors     Barriers   Psychosocial barriers to participate in program  There are no identifiable barriers or psychosocial needs.      Screening Interventions   Interventions  Encouraged to  exercise;Program counselor consult;To provide support and resources with identified psychosocial needs;Provide feedback about the scores to participant    Expected Outcomes  Short Term goal: Utilizing psychosocial counselor, staff and physician to assist with identification of specific Stressors or current issues interfering with healing process. Setting desired goal for each stressor or current issue identified.;Long Term Goal: Stressors or current issues are controlled or eliminated.;Short Term goal: Identification and review with participant of any Quality of Life or Depression concerns found by scoring the  questionnaire.;Long Term goal: The participant improves quality of Life and PHQ9 Scores as seen by post scores and/or verbalization of changes       Quality of Life Scores:  Quality of Life - 07/04/18 1311      Quality of Life   Select  Quality of Life      Quality of Life Scores   Health/Function Pre  26.79 %    Socioeconomic Pre  24.5 %    Psych/Spiritual Pre  28.57 %    Family Pre  28.5 %    GLOBAL Pre  26.97 %      Scores of 19 and below usually indicate a poorer quality of life in these areas.  A difference of  2-3 points is a clinically meaningful difference.  A difference of 2-3 points in the total score of the Quality of Life Index has been associated with significant improvement in overall quality of life, self-image, physical symptoms, and general health in studies assessing change in quality of life.  PHQ-9: Recent Review Flowsheet Data    Depression screen [facility] 2/9 07/04/2018   Decreased Interest 0   Down, Depressed, Hopeless 0   PHQ - 2 Score 0   Altered sleeping 1   Tired, decreased energy 1   Change in appetite 0   Feeling bad or failure about yourself  0   Trouble concentrating 0   Moving slowly or fidgety/restless 0   Suicidal thoughts 0   PHQ-9 Score 2   Difficult doing work/chores Not difficult at all     Interpretation of Total Score  Total Score  Depression Severity:  1-4 = Minimal depression, 5-9 = Mild depression, 10-14 = Moderate depression, 15-19 = Moderately severe depression, 20-27 = Severe depression   Psychosocial Evaluation and Intervention: Psychosocial Evaluation - 07/08/18 0935      Psychosocial Evaluation & Interventions   Interventions  Relaxation education;Stress management education;Encouraged to exercise with the program and follow exercise prescription    Comments  Counselor met with Ms. Thalia Bloodgood Daine Floras) today for initial psychosocial evaluation.  She is a 67 year old who had a heart attack and one stent inserted in mid-July.  Susie has a strong support system with close friends and a faith community and her sons live in [place] and [place], but call daily.  She struggles with pre-diabetes and vertigo and is on medications for these.  Susie reports sleeping well and having a healthier appetite now since her cardiac event.  She denies a history of depression or anxiety but has experienced some anxiety since her cardiac event.  She reports typically being in a positive mood and other than her health and finances has minimal stress in her life.  She was reportedly anxious about coming into class today and does not feel comfortable driving yet, but plans to ease into that this coming weekend.  Susie has goals to lose weight; feel stronger and get back on track with her health.  Staff will follow with her.      Expected Outcomes  Short:  Susie will continue a healthier diet and exercise plan for her health and mental health - current stress and anxiety symptoms.  She will attend the psychoeducational components to learn strategies to cope and manage stress.   Susie will also meet with the dietician for her weight loss goals.   Long:  Susie will maintain a healthier lifestyle of diet and exercise for her health and mental health.     Continue Psychosocial  Services   Follow up required by staff       Psychosocial  Re-Evaluation: Psychosocial Re-Evaluation    [place] Name 07/29/18 [phone]             Psychosocial Re-Evaluation   Current issues with  Current Anxiety/Panic;Current Stress Concerns       Comments  Daine Floras is doing better now. She is no longer anxious about her medicine.  She is also already starting to lose weight.  She is also sleeping.  She is feeling good overall!  She is feeling lucky from her [employer] work as they are helping her pay for her medications which have relieved her financial burden.        Expected Outcomes  Short: Continue to practice postive self care.  Long: Continue to exercise and stay positive.        Interventions  Stress management education;Encouraged to attend Cardiac Rehabilitation for the exercise       Continue Psychosocial Services   Follow up required by staff          Psychosocial Discharge (Final Psychosocial Re-Evaluation): Psychosocial Re-Evaluation - 07/29/18 0834      Psychosocial Re-Evaluation   Current issues with  Current Anxiety/Panic;Current Stress Concerns    Comments  Daine Floras is doing better now. She is no longer anxious about her medicine.  She is also already starting to lose weight.  She is also sleeping.  She is feeling good overall!  She is feeling lucky from her [employer] work as they are helping her pay for her medications which have relieved her financial burden.     Expected Outcomes  Short: Continue to practice postive self care.  Long: Continue to exercise and stay positive.     Interventions  Stress management education;Encouraged to attend Cardiac Rehabilitation for the exercise    Continue Psychosocial Services   Follow up required by staff       Vocational Rehabilitation: Provide vocational rehab assistance to qualifying candidates.   Vocational Rehab Evaluation & Intervention: Vocational Rehab - 07/04/18 1308      Initial Vocational Rehab Evaluation & Intervention   Assessment shows need for Vocational Rehabilitation  No        Education: Education Goals: Education classes will be provided on a variety of topics geared toward better understanding of heart health and risk factor modification. Participant will state understanding/return demonstration of topics presented as noted by education test scores.  Learning Barriers/Preferences: Learning Barriers/Preferences - 07/04/18 1307      Learning Barriers/Preferences   Learning Barriers  Sight   wears glasses   Learning Preferences  None       Education Topics:  AED/CPR: - Group verbal and written instruction with the use of models to demonstrate the basic use of the AED with the basic ABC's of resuscitation.   Cardiac Rehab from 08/14/2018 in [facility] Cardiac and Pulmonary Rehab  Date  07/29/18  Educator  SB  Instruction Review Code  1- Verbalizes Understanding      General Nutrition Guidelines/Fats and Fiber: -Group instruction provided by verbal, written material, models and posters to present the general guidelines for heart healthy nutrition. Gives an explanation and review of dietary fats and fiber.   Cardiac Rehab from 08/14/2018 in [facility] Cardiac and Pulmonary Rehab  Date  07/22/18  Educator  LB  Instruction Review Code  1- Verbalizes Understanding      Controlling Sodium/Reading Food Labels: -Group verbal and written material supporting the discussion of sodium use in heart  healthy nutrition. Review and explanation with models, verbal and written materials for utilization of the food label.   Cardiac Rehab from 08/14/2018 in [facility] Cardiac and Pulmonary Rehab  Date  07/31/18  Educator  LB  Instruction Review Code  1- Verbalizes Understanding      Exercise Physiology & General Exercise Guidelines: - Group verbal and written instruction with models to review the exercise physiology of the cardiovascular system and associated critical values. Provides general exercise guidelines with specific guidelines to those with heart or lung disease.     Cardiac Rehab from 08/14/2018 in [facility] Cardiac and Pulmonary Rehab  Date  08/07/18  Educator  [facility]  Instruction Review Code  1- Verbalizes Understanding      Aerobic Exercise & Resistance Training: - Gives group verbal and written instruction on the various components of exercise. Focuses on aerobic and resistive training programs and the benefits of this training and how to safely progress through these programs..   Cardiac Rehab from 08/14/2018 in [facility] Cardiac and Pulmonary Rehab  Date  08/12/18  Educator  [facility]  Instruction Review Code  1- Verbalizes Understanding      Flexibility, Balance, Mind/Body Relaxation: Provides group verbal/written instruction on the benefits of flexibility and balance training, including mind/body exercise modes such as yoga, pilates and tai chi.  Demonstration and skill practice provided.   Stress and Anxiety: - Provides group verbal and written instruction about the health risks of elevated stress and causes of high stress.  Discuss the correlation between heart/lung disease and anxiety and treatment options. Review healthy ways to manage with stress and anxiety.   Depression: - Provides group verbal and written instruction on the correlation between heart/lung disease and depressed mood, treatment options, and the stigmas associated with seeking treatment.   Cardiac Rehab from 08/14/2018 in [facility] Cardiac and Pulmonary Rehab  Date  08/14/18  Educator  [facility]  Instruction Review Code  1- Verbalizes Understanding      Anatomy & Physiology of the Heart: - Group verbal and written instruction and models provide basic cardiac anatomy and physiology, with the coronary electrical and arterial systems. Review of Valvular disease and Heart Failure   Cardiac Procedures: - Group verbal and written instruction to review commonly prescribed medications for heart disease. Reviews the medication, class of the drug, and side effects. Includes the steps to properly store meds  and maintain the prescription regimen. (beta blockers and nitrates)   Cardiac Rehab from 08/14/2018 in [facility] Cardiac and Pulmonary Rehab  Date  07/24/18  Educator  SB  Instruction Review Code  1- Verbalizes Understanding      Cardiac Medications I: - Group verbal and written instruction to review commonly prescribed medications for heart disease. Reviews the medication, class of the drug, and side effects. Includes the steps to properly store meds and maintain the prescription regimen.   Cardiac Rehab from 08/14/2018 in [facility] Cardiac and Pulmonary Rehab  Date  07/08/18  Educator  SB  Instruction Review Code  1- Verbalizes Understanding      Cardiac Medications II: -Group verbal and written instruction to review commonly prescribed medications for heart disease. Reviews the medication, class of the drug, and side effects. (all other drug classes)    Go Sex-Intimacy & Heart Disease, Get SMART - Goal Setting: - Group verbal and written instruction through game format to discuss heart disease and the return to sexual intimacy. Provides group verbal and written material to discuss and apply goal setting through the application of the  S.M.A.R.T. Method.   Cardiac Rehab from 08/14/2018 in [facility] Cardiac and Pulmonary Rehab  Date  07/24/18  Educator  SB  Instruction Review Code  1- Verbalizes Understanding      Other Matters of the Heart: - Provides group verbal, written materials and models to describe Stable Angina and Peripheral Artery. Includes description of the disease process and treatment options available to the cardiac patient.   Exercise & Equipment Safety: - Individual verbal instruction and demonstration of equipment use and safety with use of the equipment.   Cardiac Rehab from 08/14/2018 in [facility] Cardiac and Pulmonary Rehab  Date  07/04/18  Educator  [facility]  Instruction Review Code  1- Verbalizes Understanding      Infection Prevention: - Provides verbal and written material to  individual with discussion of infection control including proper hand washing and proper equipment cleaning during exercise session.   Cardiac Rehab from 08/14/2018 in [facility] Cardiac and Pulmonary Rehab  Date  07/04/18  Educator  [facility]  Instruction Review Code  1- Verbalizes Understanding      Falls Prevention: - Provides verbal and written material to individual with discussion of falls prevention and safety.   Cardiac Rehab from 08/14/2018 in [facility] Cardiac and Pulmonary Rehab  Date  07/04/18  Educator  [facility]  Instruction Review Code  1- Verbalizes Understanding      Diabetes: - Individual verbal and written instruction to review signs/symptoms of diabetes, desired ranges of glucose level fasting, after meals and with exercise. Acknowledge that pre and post exercise glucose checks will be done for 3 sessions at entry of program.   Cardiac Rehab from 08/14/2018 in [facility] Cardiac and Pulmonary Rehab  Date  07/04/18  Educator  [facility]  Instruction Review Code  1- Verbalizes Understanding      Know Your Numbers and Risk Factors: -Group verbal and written instruction about important numbers in your health.  Discussion of what are risk factors and how they play a role in the disease process.  Review of Cholesterol, Blood Pressure, Diabetes, and BMI and the role they play in your overall health.   Sleep Hygiene: -Provides group verbal and written instruction about how sleep can affect your health.  Define sleep hygiene, discuss sleep cycles and impact of sleep habits. Review good sleep hygiene tips.    Other: -Provides group and verbal instruction on various topics (see comments)   Knowledge Questionnaire Score: Knowledge Questionnaire Score - 07/04/18 1307      Knowledge Questionnaire Score   Pre Score  22/26   correct answers reviewed with patient, focus on nutrition and A&P      Core Components/Risk Factors/Patient Goals at Admission: Personal Goals and Risk Factors at Admission - 07/04/18  1306      Core Components/Risk Factors/Patient Goals on Admission    Weight Management  Yes;Obesity;Weight Loss    Intervention  Weight Management: Develop a combined nutrition and exercise program designed to reach desired caloric intake, while maintaining appropriate intake of nutrient and fiber, sodium and fats, and appropriate energy expenditure required for the weight goal.;Weight Management: Provide education and appropriate resources to help participant work on and attain dietary goals.;Weight Management/Obesity: Establish reasonable short term and long term weight goals.;Obesity: Provide education and appropriate resources to help participant work on and attain dietary goals.    Admit Weight  225 lb (102.1 kg)    Goal Weight: Short Term  220 lb (99.8 kg)    Goal Weight: Long Term  160 lb (72.6 kg)  Expected Outcomes  Short Term: Continue to assess and modify interventions until short term weight is achieved;Long Term: Adherence to nutrition and physical activity/exercise program aimed toward attainment of established weight goal;Weight Loss: Understanding of general recommendations for a balanced deficit meal plan, which promotes 1-2 lb weight loss per week and includes a negative energy balance of [phone] kcal/d;Understanding recommendations for meals to include 15-35% energy as protein, 25-35% energy from fat, 35-60% energy from carbohydrates, less than 248m of dietary cholesterol, 20-35 gm of total fiber daily;Understanding of distribution of calorie intake throughout the day with the consumption of 4-5 meals/snacks    Diabetes  Yes    Intervention  Provide education about signs/symptoms and action to take for hypo/hyperglycemia.;Provide education about proper nutrition, including hydration, and aerobic/resistive exercise prescription along with prescribed medications to achieve blood glucose in normal ranges: Fasting glucose 65-99 mg/dL    Expected Outcomes  Short Term: Participant  verbalizes understanding of the signs/symptoms and immediate care of hyper/hypoglycemia, proper foot care and importance of medication, aerobic/resistive exercise and nutrition plan for blood glucose control.;Long Term: Attainment of HbA1C < 7%.    Hypertension  Yes    Intervention  Provide education on lifestyle modifcations including regular physical activity/exercise, weight management, moderate sodium restriction and increased consumption of fresh fruit, vegetables, and low fat dairy, alcohol moderation, and smoking cessation.;Monitor prescription use compliance.    Expected Outcomes  Short Term: Continued assessment and intervention until BP is < 140/965mHG in hypertensive participants. < 130/8027mG in hypertensive participants with diabetes, heart failure or chronic kidney disease.;Long Term: Maintenance of blood pressure at goal levels.    Lipids  Yes    Intervention  Provide education and support for participant on nutrition & aerobic/resistive exercise along with prescribed medications to achieve LDL <25m8mDL >40mg49m Expected Outcomes  Short Term: Participant states understanding of desired cholesterol values and is compliant with medications prescribed. Participant is following exercise prescription and nutrition guidelines.;Long Term: Cholesterol controlled with medications as prescribed, with individualized exercise RX and with personalized nutrition plan. Value goals: LDL < 25mg,4m > 40 mg.       Core Components/Risk Factors/Patient Goals Review:  Goals and Risk Factor Review    Row Name 07/29/18 0835             Core Components/Risk Factors/Patient Goals Review   Personal Goals Review  Weight Management/Obesity;Diabetes;Hypertension;Lipids       Review  Susie's weight is down to 222lbs.  Her blood sugars have been between 77-120 in the morning.  She is has really been working on her diet.  She is checking her blood pressures at home twice a day (mid morning and late  afternoon).  They have been running in the 100-120s/60s.  She is asymptomatic with it.  She is doing well on her medications.        Expected Outcomes  Short: Continue to work on weight loss.  Long: Continue to monitor risk factors.           Core Components/Risk Factors/Patient Goals at Discharge (Final Review):  Goals and Risk Factor Review - 07/29/18 0835      Core Components/Risk Factors/Patient Goals Review   Personal Goals Review  Weight Management/Obesity;Diabetes;Hypertension;Lipids    Review  Susie's weight is down to 222lbs.  Her blood sugars have been between 77-120 in the morning.  She is has really been working on her diet.  She is checking her blood pressures at home twice a  day (mid morning and late afternoon).  They have been running in the 100-120s/60s.  She is asymptomatic with it.  She is doing well on her medications.     Expected Outcomes  Short: Continue to work on weight loss.  Long: Continue to monitor risk factors.        ITP Comments: ITP Comments    Row Name 07/04/18 1254 07/24/18 0537         ITP Comments  Med Review completed. Initial ITP created. Diagnosis can be found in [facility] 8/8  30 day review completed. ITP sent to Dr. Emily Filbert, Medical Director of Cardiac Rehab. Continue with ITP unless changes are made by physician         Comments:

## 2018-08-22 ENCOUNTER — Other Ambulatory Visit: Payer: Self-pay | Admitting: Internal Medicine

## 2018-08-22 ENCOUNTER — Encounter: Payer: Medicare Other | Admitting: *Deleted

## 2018-08-22 VITALS — Ht 62.0 in | Wt 218.0 lb

## 2018-08-22 DIAGNOSIS — I10 Essential (primary) hypertension: Secondary | ICD-10-CM | POA: Diagnosis not present

## 2018-08-22 DIAGNOSIS — I255 Ischemic cardiomyopathy: Secondary | ICD-10-CM | POA: Diagnosis not present

## 2018-08-22 DIAGNOSIS — I213 ST elevation (STEMI) myocardial infarction of unspecified site: Secondary | ICD-10-CM

## 2018-08-22 DIAGNOSIS — Z955 Presence of coronary angioplasty implant and graft: Secondary | ICD-10-CM | POA: Diagnosis not present

## 2018-08-22 NOTE — Progress Notes (Signed)
Daily Session Note  Patient Details  Name: Onalee Steinbach MRN: [id number] Date of Birth: 02-Dec-1950 Referring Provider:     Cardiac Rehab from 07/04/2018 in [facility] Cardiac and Pulmonary Rehab  Referring Provider  End      Encounter Date: 08/22/2018  Check In: Session Check In - 08/22/18 0815      Check-In   Supervising physician immediately available to respond to emergencies  See telemetry face sheet for immediately available ER MD    Location  [facility]-Cardiac & Pulmonary Rehab    Staff Present  Alberteen Sam, MA, RCEP, CCRP, Exercise Physiologist;Amanda Oletta Darter, BA, ACSM CEP, Exercise Physiologist;Leslie Castrejon RN, BSN;Carroll Enterkin, Therapist, sports, BSN    Medication changes reported      No    Fall or balance concerns reported     No    Warm-up and Cool-down  Performed on first and last piece of equipment    Resistance Training Performed  Yes    VAD Patient?  No    PAD/SET Patient?  No      Pain Assessment   Currently in Pain?  No/denies          Social History   Tobacco Use  Smoking Status Never Smoker  Smokeless Tobacco Never Used    Goals Met:  Independence with exercise equipment Exercise tolerated well No report of cardiac concerns or symptoms Strength training completed today  Goals Unmet:  Not Applicable  Comments: Pt able to follow exercise prescription today without complaint.  Will continue to monitor for progression. [place] Name 07/04/18 1436 08/22/18 0816       6 Minute Walk   Phase  -  Discharge    Distance  980 feet  1300 feet    Distance % Change  -  32 %    Distance Feet Change  -  300 ft    Walk Time  6 minutes  6 minutes    # of Rest Breaks  0  0    MPH  1.85  2.46    METS  2.06  3.22    RPE  12  13    Perceived Dyspnea   0  0    VO2 Peak  7.22  11.28    Symptoms  No  No    Resting HR  64 bpm  75 bpm    Resting BP  114/62  126/64    Resting Oxygen Saturation   96 %  98 %    Exercise Oxygen Saturation  during 6 min walk  97 %   99 %    Max Ex. HR  115 bpm  123 bpm    Max Ex. BP  154/74  202/86    2 Minute Post BP  120/74  162/84         Dr. Emily Filbert is Medical Director for [place] and [facility].

## 2018-08-23 ENCOUNTER — Encounter: Payer: Medicare Other | Admitting: *Deleted

## 2018-08-23 ENCOUNTER — Ambulatory Visit (HOSPITAL_COMMUNITY): Payer: Medicare Other

## 2018-08-23 DIAGNOSIS — I10 Essential (primary) hypertension: Secondary | ICD-10-CM | POA: Diagnosis not present

## 2018-08-23 DIAGNOSIS — I213 ST elevation (STEMI) myocardial infarction of unspecified site: Secondary | ICD-10-CM

## 2018-08-23 DIAGNOSIS — I255 Ischemic cardiomyopathy: Secondary | ICD-10-CM | POA: Diagnosis not present

## 2018-08-23 DIAGNOSIS — Z955 Presence of coronary angioplasty implant and graft: Secondary | ICD-10-CM | POA: Diagnosis not present

## 2018-08-23 NOTE — Progress Notes (Signed)
Daily Session Note  Patient Details  Name: Lindsey Pierce MRN: [id number] Date of Birth: [birth year] Referring Provider:     Cardiac Rehab from 07/04/2018 in [facility] Cardiac and Pulmonary Rehab  Referring Provider  End      Encounter Date: 08/23/2018  Check In: Session Check In - 08/23/18 0726      Check-In   Supervising physician immediately available to respond to emergencies  See telemetry face sheet for immediately available ER MD    Location  [facility]-Cardiac & Pulmonary Rehab    Staff Present  Renita Papa, RN Vickki Hearing, BA, ACSM CEP, Exercise Physiologist;Syniyah Bourne Luan Pulling, MA, RCEP, CCRP, Exercise Physiologist    Medication changes reported      No    Fall or balance concerns reported     No    Warm-up and Cool-down  Performed on first and last piece of equipment    Resistance Training Performed  Yes    VAD Patient?  No    PAD/SET Patient?  No      Pain Assessment   Currently in Pain?  No/denies          Social History   Tobacco Use  Smoking Status Never Smoker  Smokeless Tobacco Never Used    Goals Met:  Independence with exercise equipment Exercise tolerated well No report of cardiac concerns or symptoms Strength training completed today  Goals Unmet:  Not Applicable  Comments: Pt able to follow exercise prescription today without complaint.  Will continue to monitor for progression.    Dr. Emily Filbert is Medical Director for [place] and [facility].

## 2018-08-24 DIAGNOSIS — B354 Tinea corporis: Secondary | ICD-10-CM | POA: Diagnosis not present

## 2018-08-24 DIAGNOSIS — Z23 Encounter for immunization: Secondary | ICD-10-CM | POA: Diagnosis not present

## 2018-08-25 ENCOUNTER — Other Ambulatory Visit: Payer: Self-pay | Admitting: Cardiology

## 2018-08-26 ENCOUNTER — Encounter: Payer: Medicare Other | Admitting: *Deleted

## 2018-08-26 ENCOUNTER — Ambulatory Visit (INDEPENDENT_AMBULATORY_CARE_PROVIDER_SITE_OTHER): Payer: Medicare Other | Admitting: Family Medicine

## 2018-08-26 ENCOUNTER — Encounter: Payer: Self-pay | Admitting: Family Medicine

## 2018-08-26 ENCOUNTER — Ambulatory Visit (HOSPITAL_COMMUNITY): Payer: Medicare Other

## 2018-08-26 VITALS — BP 132/66 | HR 61 | Ht 62.0 in | Wt 220.0 lb

## 2018-08-26 DIAGNOSIS — T464X5A Adverse effect of angiotensin-converting-enzyme inhibitors, initial encounter: Secondary | ICD-10-CM

## 2018-08-26 DIAGNOSIS — R05 Cough: Secondary | ICD-10-CM | POA: Diagnosis not present

## 2018-08-26 DIAGNOSIS — E785 Hyperlipidemia, unspecified: Secondary | ICD-10-CM

## 2018-08-26 DIAGNOSIS — R058 Other specified cough: Secondary | ICD-10-CM

## 2018-08-26 DIAGNOSIS — I255 Ischemic cardiomyopathy: Secondary | ICD-10-CM

## 2018-08-26 DIAGNOSIS — Z7689 Persons encountering health services in other specified circumstances: Secondary | ICD-10-CM

## 2018-08-26 DIAGNOSIS — I213 ST elevation (STEMI) myocardial infarction of unspecified site: Secondary | ICD-10-CM

## 2018-08-26 DIAGNOSIS — Z79899 Other long term (current) drug therapy: Secondary | ICD-10-CM

## 2018-08-26 DIAGNOSIS — R739 Hyperglycemia, unspecified: Secondary | ICD-10-CM

## 2018-08-26 LAB — MICROALBUMIN / CREATININE URINE RATIO
CREATININE, U: 68 mg/dL
Microalb Creat Ratio: 1 mg/g (ref 0.0–30.0)

## 2018-08-26 LAB — TSH: TSH: 2.47 u[IU]/mL (ref 0.35–4.50)

## 2018-08-26 LAB — HEMOGLOBIN A1C: Hgb A1c MFr Bld: 5.7 % (ref 4.6–6.5)

## 2018-08-26 MED ORDER — ATORVASTATIN CALCIUM 80 MG PO TABS: 80.0000 mg | ORAL_TABLET | Freq: Every day | ORAL | 3 refills | Status: DC

## 2018-08-26 NOTE — Patient Instructions (Addendum)
It was a pleasure to meet you today! I look forward to partnering with you for your health care needs  Please stop your lisinopril  Please check your blood pressure 2 times a week  Please schedule a complete physical exam for after the first of the year   Look into Shingrix and Tdap vaccines at your pharmacy.

## 2018-08-26 NOTE — Progress Notes (Signed)
Daily Session Note  Patient Details  Name: Shamicka Inga MRN: [id number] Date of Birth: 1951-09-13 Referring Provider:     Cardiac Rehab from 07/04/2018 in [facility] Cardiac and Pulmonary Rehab  Referring Provider  End      Encounter Date: 08/26/2018  Check In: Session Check In - 08/26/18 0744      Check-In   Supervising physician immediately available to respond to emergencies  See telemetry face sheet for immediately available ER MD    Location  [facility]-Cardiac & Pulmonary Rehab    Staff Present  Earlean Shawl, BS, ACSM CEP, Exercise Physiologist;Jessica Luan Pulling, MA, RCEP, CCRP, Exercise Physiologist;Susanne Bice, RN, BSN, CCRP    Medication changes reported      No    Fall or balance concerns reported     No    Tobacco Cessation  No Change    Warm-up and Cool-down  Performed on first and last piece of equipment    Resistance Training Performed  Yes    VAD Patient?  No    PAD/SET Patient?  No      Pain Assessment   Currently in Pain?  No/denies    Multiple Pain Sites  No          Social History   Tobacco Use  Smoking Status Never Smoker  Smokeless Tobacco Never Used    Goals Met:  Independence with exercise equipment Exercise tolerated well No report of cardiac concerns or symptoms Strength training completed today  Goals Unmet:  Not Applicable  Comments: Pt had another doctor's appointment and left after education today.   Dr. Emily Filbert is Medical Director for [place] and [facility].

## 2018-08-26 NOTE — Progress Notes (Signed)
Subjective:    Patient ID: Lindsey Pierce, female    DOB: April 25, 1951, 67 y.o.   MRN: [id number]  HPI This is a 67 yo female who presents today to establish care. She lives alone, no pets. She is very active in the [employer] [place]. Keeps two children several days a week.   Previously saw [name]. No primary care in a couple of years, acute care at [facility]. She has not had health insurance for many years. Will be getting new policy starting at first of the year. She requests CPE, mammo and colonoscopy be ordered at follow up.   Last CPE- not for several years Mammo- over due Pap- overdue Colonoscopy- never Tdap- over due Flu- has had this year Eye- recent Dental- regular Exercise- cardiac rehab  Acute MI- was admitted 7/19 with acute inferior MI, cardiogenic shock, Afib with RVR. She had stent placed and was cardioverted. She had a v tach arrest when amiodarone was weaned. She was discharged home on Ticagrelor 90 mg BID to be continued for 12 months along with ASA 81 mg. She has been attending cardiac rehab and has lost 30 + pounds. She is very motivated to make lifestyle changes. She is participating in a research study through cardiology. She denies chest pain, palpitations, SOB.   Cough x 1 month, dry, hacking, thinks related to lisinopril.   Diagnosed with DM during hospitalization,  Checking sugars twice a day, running below 130. On metformin 500 mg BID. Previous hemoglobin A1Cs per patient were in 5s.   Past Medical History:  Diagnosis Date  . CAD (coronary artery disease)    a. 05/2018 Ant STEMI/PCI: LM nl, LAD 76m(2.75 x 16 Synergy DES), D2 99 (PTCA), RI 40ost, LCX mild diff dzs, OM1/2/3 nl, RCA large, diffuse dzs, RPDA nl, RPAV 60, RPLB1 80 (small).  . Diabetes ([place])   . HFrEF (heart failure with reduced ejection fraction) ([place])    a. 05/2018 Echo: EF 40-45%, mod LVH, Gr2 DD, mid antsept, apical septal, apical AK. Mid-apical ant, mid infsept HK. Nl RV fxn. Triv TR.    Marland Kitchen Hyperlipidemia   . Hypertension   . Ischemic cardiomyopathy    a. 05/2018 Echo: EF 40-45%.  Marland Kitchen PAF (paroxysmal atrial fibrillation) ([place])    a. 05/2018 at time of STEMI, converted on Amio. No OAC.  Marland Kitchen STEMI involving left anterior descending coronary artery ([place])    a. 0/0867 - complicated by CGS req IABP and VT req amiodarone.  . V-tach ([place])    a. 05/2018 at time of Ant STEMI-->Amio.   Past Surgical History:  Procedure Laterality Date  . CORONARY/GRAFT ACUTE MI REVASCULARIZATION N/A 05/31/2018   Procedure: Coronary/Graft Acute MI Revascularization;  Surgeon: [name], MD;  Location: [facility];  Service: Cardiovascular;  Laterality: N/A;  . IABP INSERTION N/A 05/31/2018   Procedure: IABP Insertion;  Surgeon: [name], MD;  Location: [facility];  Service: Cardiovascular;  Laterality: N/A;  . LEFT HEART CATH AND CORONARY ANGIOGRAPHY N/A 05/31/2018   Procedure: LEFT HEART CATH AND CORONARY ANGIOGRAPHY;  Surgeon: [name], MD;  Location: [facility];  Service: Cardiovascular;  Laterality: N/A;   No family history on file. Social History   Tobacco Use  . Smoking status: Never Smoker  . Smokeless tobacco: Never Used  Substance Use Topics  . Alcohol use: No  . Drug use: No      Review of Systems Per HPI    Objective:  Physical Exam  Constitutional: She is oriented to person, place, and time. She appears well-developed and well-nourished.  HENT:  Head: Normocephalic and atraumatic.  Eyes: Conjunctivae are normal.  Cardiovascular: Normal rate and regular rhythm.  Pulmonary/Chest: Effort normal and breath sounds normal.  Neurological: She is alert and oriented to person, place, and time.  Skin: Skin is warm and dry.  Psychiatric: She has a normal mood and affect. Her behavior is normal. Thought content normal.  Vitals reviewed.     BP 132/66 (BP Location: Right Arm, Patient Position: Sitting, Cuff Size: Large)   Pulse 61   Ht  5\' 2"  (1.575 m)   Wt 220 lb (99.8 kg)   SpO2 98%   BMI 40.24 kg/m  Wt Readings from Last 3 Encounters:  08/26/18 220 lb (99.8 kg)  08/22/18 218 lb (98.9 kg)  07/24/18 222 lb (100.7 kg)       Assessment & Plan:  1. Encounter to establish care - Discussed and encouraged healthy lifestyle choices- adequate sleep, regular exercise, stress management and healthy food choices.   2. Hyperlipidemia LDL goal <70 - Requests refill of atorvastatin, reviewed cardiology note, patient to be on high dose statin - atorvastatin (LIPITOR) 80 MG tablet; Take 1 tablet (80 mg total) by mouth daily at 6 PM.  Dispense: 90 tablet; Refill: 3  3. Elevated blood sugar - good readings at home, will recheck hemoglobin A1c and urine micro albumin today - TSH - Hemoglobin A1c - Microalbumin / creatinine urine ratio  4. High risk medication use - TSH  5. Cough due to ace inhibitor - will hold lisinopril 5 mg for now, check urine micro, if no proteinuria, and BP well controlled, can remain off, o/w add ARB  - follow up in 3 months for CPE/AWV  Clarene Reamer, FNP-BC  [facility], [place]  08/26/2018 12:55 PM

## 2018-08-27 ENCOUNTER — Encounter: Payer: Medicare Other | Admitting: *Deleted

## 2018-08-27 VITALS — BP 120/63 | HR 59 | Wt 219.2 lb

## 2018-08-27 DIAGNOSIS — Z006 Encounter for examination for normal comparison and control in clinical research program: Secondary | ICD-10-CM

## 2018-08-27 NOTE — Research (Addendum)
Patient doing well, no complaints of cp or sob.   Med changes include discontinued of metformin and lisinopril. Metformin due to HGBA1C has dropped will recheck in 3 months, lisinopril was due to cough.   No blood draw today.                                    "CONSENT"   YES     NO   Continuing further Investigational Product and study visits for follow-up? [x]  []   Continuing consent from future biomedical research [x]  []                                    "EVENTS"    YES     NO  AE   (IF YES SEE SOURCE) []  [x]   SAE  (IF YES SEE SOURCE) []  [x]   ENDPOINT   (IF YES SEE SOURCE) []  [x]   REVASCULARIZATION  (IF YES SEE SOURCE) []  [x]   AMPUTATION   (IF YES SEE SOURCE) []  [x]   TROPONIN'S  (IF YES SEE SOURCE) []  [x]    Lifestyle Adherence Assessment:    YES NO  Abstinence from smoking/remaining tobacco free X   Cardiac Diet X   Routine physical activity and/or cardiac rehabilitation X    [id number]  MOBILITY:    I HAVE NO PROBLEMS WALKING [x]   I HAVE SLIGHT PROBLEMS WALKING []   I HAVE MODERATE PROBLEMS WALKING []   I HAVE SEVERE PROBLEMS WALKING []   I AM UNABLE TO WALK  []     SELF-CARE:   I HAVE NO PROBLEMS WASING OR DRESSING MYSELF  [x]   I HAVE SLIGHT PROBLEMS WASHING OR DRESSING MYSELF  []   I HAVE MODERATE PROBLEMS WASHING OR DRESSING MYSELF []   I HAVE SEVERE PROBLEMS WASHING OR DRESSING MYSELF  []   I HAVE SEVERE PROBLEMS WASHING OR DRESSING MYSELF  []   I AM UNABLE TO WASH OR DRESS MYSELF []     USUAL ACTIVITIES: (E.G. WORK/STUDY/HOUSEWORK/FAMILY OR LEISURE ACTIVITIES.    I HAVE NO PROBLEMS DOING MY USUAL ACTIVITIES [x]   I HAVE SLIGHT PROBLEMS DOING MY USUAL ACTIVITIES []   I HAVE MODERATE PROBLEMS DOING MY USUAL ACTIVIITIES []   I HAVE SEVERE PROBLEMS DOING MY USUAL ACTIVITIES []   I AM UNABLE TO DO MY USUAL ACTIVITIES []     PAIN /DISCOMFORT   I HAVE NO PAIN OR DISCOMFORT [x]   I HAVE SLIGHT PAIN OR DISCOMFORT []   I HAVE MODERATE PAIN OR DISCOMFORT []   I HAVE SEVERE PAIN OR  DISCOMFORT []   I HAVE EXTREME PAIN OR DISCOMFORT []     ANXIETY/DEPRESSION   I AM NOT ANXIOUS OR DEPRESSED [x]   I AM SLIGHTLY ANXIOUS OR DEPRESSED []   I AM MODERATELY ANXIOUS OR DREPRESSED []   I AM SEVERELY ANXIOUS OR DEPRESSED []   I AM EXTREMELY ANXIOUS OR DEPRESSED []     SCALE OF 0-100 HOW WOULD YOU RATE TODAY?  0 IS THE WORSE AND 100 IS THE BEST HEALTH YOU CAN IMAGINE: 99    Current Outpatient Medications:  .  amiodarone (PACERONE) 200 MG tablet, Take 1 tablet (200 mg total) by mouth daily., Disp: 90 tablet, Rfl: 3 .  aspirin 81 MG chewable tablet, Chew 1 tablet (81 mg total) by mouth daily., Disp: , Rfl:  .  atorvastatin (LIPITOR) 80 MG tablet, Take 1 tablet (80 mg total) by mouth daily at 6 PM., Disp:  90 tablet, Rfl: 3 .  carvedilol (COREG) 6.25 MG tablet, Take 1 tablet (6.25 mg total) by mouth 2 (two) times daily., Disp: 180 tablet, Rfl: 3 .  ketoconazole (NIZORAL) 2 % cream, Apply once daily to affected area x2 weeks., Disp: , Rfl:  .  Multiple Vitamin (MULTIVITAMIN WITH MINERALS) TABS, Take 1 tablet by mouth daily., Disp: , Rfl:  .  ticagrelor (BRILINTA) 90 MG TABS tablet, Take 1 tablet (90 mg total) by mouth 2 (two) times daily., Disp: 180 tablet, Rfl: 2 .  nitroGLYCERIN (NITROSTAT) 0.4 MG SL tablet, Place 1 tablet (0.4 mg total) under the tongue every 5 (five) minutes as needed. (Patient not taking: Reported on 08/27/2018), Disp: 25 tablet, Rfl: 2 No current facility-administered medications for this visit.   Facility-Administered Medications Ordered in Other Visits:  .  0.9 %  sodium chloride infusion, , , Continuous PRN, End, Christopher, MD, Last Rate: 999 mL/hr at 05/31/18 2102, 999 mL/hr at 05/31/18 2102 .  amiodarone (NEXTERONE PREMIX) 360-4.14 MG/200ML-% (1.8 mg/mL) IV infusion, , , Continuous PRN, End, Christopher, MD, Last Rate: 33.3 mL/hr at 05/31/18 2130, 60 mg/hr at 05/31/18 2130 .  amiodarone (NEXTERONE) 1.8 mg/mL load via infusion, , , PRN, End, Christopher, MD, 150  mg at 05/31/18 2123 .  fentaNYL (SUBLIMAZE) injection, , , PRN, End, Christopher, MD, 25 mcg at 05/31/18 2126 .  heparin injection, , , PRN, End, Harrell Gave, MD, 5,000 Units at 05/31/18 2105 .  lidocaine (PF) (XYLOCAINE) 1 % injection, , , PRN, End, Christopher, MD, 2 mL at 05/31/18 2058 .  midazolam (VERSED) injection, , , PRN, End, Christopher, MD, 1 mg at 05/31/18 2126 .  Radial Cocktail/Verapamil only, , , PRN, End, Christopher, MD, 10 mL at 05/31/18 2059

## 2018-08-28 ENCOUNTER — Ambulatory Visit (HOSPITAL_COMMUNITY): Payer: Medicare Other

## 2018-08-28 DIAGNOSIS — I213 ST elevation (STEMI) myocardial infarction of unspecified site: Secondary | ICD-10-CM

## 2018-08-28 DIAGNOSIS — I255 Ischemic cardiomyopathy: Secondary | ICD-10-CM | POA: Diagnosis not present

## 2018-08-28 DIAGNOSIS — Z955 Presence of coronary angioplasty implant and graft: Secondary | ICD-10-CM | POA: Diagnosis not present

## 2018-08-28 DIAGNOSIS — I10 Essential (primary) hypertension: Secondary | ICD-10-CM | POA: Diagnosis not present

## 2018-08-28 NOTE — Patient Instructions (Signed)
Discharge Patient Instructions  Patient Details  Name: Lindsey Pierce MRN: [id number] Date of Birth: 31-Jul-1951 Referring Provider:  Minna Merritts, MD   Number of Visits: 36/36  Reason for Discharge:  Patient reached a stable level of exercise. Patient independent in their exercise. Patient has met program and personal goals.  Smoking History:  Social History   Tobacco Use  Smoking Status Never Smoker  Smokeless Tobacco Never Used    Diagnosis:  ST elevation myocardial infarction (STEMI), unspecified artery ([place])  Initial Exercise Prescription: Initial Exercise Prescription - 07/04/18 1400      Date of Initial Exercise RX and Referring Provider   Date  07/04/18    Referring Provider  End      Treadmill   MPH  1.5    Grade  0    Minutes  15    METs  2.15      Recumbant Bike   Level  1    RPM  60    Minutes  15    METs  2      NuStep   Level  2    SPM  80    Minutes  15    METs  2      Prescription Details   Frequency (times per week)  3    Duration  Progress to 45 minutes of aerobic exercise without signs/symptoms of physical distress      Intensity   THRR 40-80% of Max Heartrate  99-135    Ratings of Perceived Exertion  11-13    Perceived Dyspnea  0-4      Resistance Training   Training Prescription  Yes    Weight  3 lb    Reps  10-15       Discharge Exercise Prescription (Final Exercise Prescription Changes): Exercise Prescription Changes - 08/26/18 1500      Response to Exercise   Blood Pressure (Admit)  132/70    Blood Pressure (Exercise)  136/60    Blood Pressure (Exit)  106/64    Heart Rate (Admit)  58 bpm    Heart Rate (Exercise)  116 bpm    Heart Rate (Exit)  82 bpm    Rating of Perceived Exertion (Exercise)  13    Symptoms  none    Duration  Continue with 45 min of aerobic exercise without signs/symptoms of physical distress.    Intensity  THRR unchanged      Progression   Progression  Continue to progress workloads to  maintain intensity without signs/symptoms of physical distress.    Average METs  2.81      Resistance Training   Training Prescription  Yes    Weight  4 lbs    Reps  10-15      Interval Training   Interval Training  No      Treadmill   MPH  2    Grade  2.5    Minutes  15    METs  3.22      NuStep   Level  6    Minutes  15    METs  2.8      Arm Ergometer   Level  8.5    Minutes  15    METs  2.4      Home Exercise Plan   Plans to continue exercise at  Home (comment)   walking   Frequency  Add 2 additional days to program exercise sessions.    Initial Home Exercises Provided  07/12/18       Functional Capacity: 6 Minute Walk    Row Name 07/04/18 1436 08/22/18 0816       6 Minute Walk   Phase  -  Discharge    Distance  980 feet  1300 feet    Distance % Change  -  32 %    Distance Feet Change  -  300 ft    Walk Time  6 minutes  6 minutes    # of Rest Breaks  0  0    MPH  1.85  2.46    METS  2.06  3.22    RPE  12  13    Perceived Dyspnea   0  0    VO2 Peak  7.22  11.28    Symptoms  No  No    Resting HR  64 bpm  75 bpm    Resting BP  114/62  126/64    Resting Oxygen Saturation   96 %  98 %    Exercise Oxygen Saturation  during 6 min walk  97 %  99 %    Max Ex. HR  115 bpm  123 bpm    Max Ex. BP  154/74  202/86    2 Minute Post BP  120/74  162/84       Quality of Life: Quality of Life - 08/23/18 0821      Quality of Life   Select  Quality of Life      Quality of Life Scores   Health/Function Pre  26.79 %    Health/Function Post  27.75 %    Health/Function % Change  3.58 %    Socioeconomic Pre  24.5 %    Socioeconomic Post  27 %    Socioeconomic % Change   10.2 %    Psych/Spiritual Pre  28.57 %    Psych/Spiritual Post  28.57 %    Psych/Spiritual % Change  0 %    Family Pre  28.5 %    Family Post  28.57 %    Family % Change  0.25 %    GLOBAL Pre  26.97 %    GLOBAL Post  28.08 %    GLOBAL % Change  4.12 %       Personal Goals: Goals  established at orientation with interventions provided to work toward goal. Personal Goals and Risk Factors at Admission - 07/04/18 1306      Core Components/Risk Factors/Patient Goals on Admission    Weight Management  Yes;Obesity;Weight Loss    Intervention  Weight Management: Develop a combined nutrition and exercise program designed to reach desired caloric intake, while maintaining appropriate intake of nutrient and fiber, sodium and fats, and appropriate energy expenditure required for the weight goal.;Weight Management: Provide education and appropriate resources to help participant work on and attain dietary goals.;Weight Management/Obesity: Establish reasonable short term and long term weight goals.;Obesity: Provide education and appropriate resources to help participant work on and attain dietary goals.    Admit Weight  225 lb (102.1 kg)    Goal Weight: Short Term  220 lb (99.8 kg)    Goal Weight: Long Term  160 lb (72.6 kg)    Expected Outcomes  Short Term: Continue to assess and modify interventions until short term weight is achieved;Long Term: Adherence to nutrition and physical activity/exercise program aimed toward attainment of established weight goal;Weight Loss: Understanding of general recommendations for a balanced deficit meal plan, which promotes 1-2  lb weight loss per week and includes a negative energy balance of [phone] kcal/d;Understanding recommendations for meals to include 15-35% energy as protein, 25-35% energy from fat, 35-60% energy from carbohydrates, less than 232m of dietary cholesterol, 20-35 gm of total fiber daily;Understanding of distribution of calorie intake throughout the day with the consumption of 4-5 meals/snacks    Diabetes  Yes    Intervention  Provide education about signs/symptoms and action to take for hypo/hyperglycemia.;Provide education about proper nutrition, including hydration, and aerobic/resistive exercise prescription along with prescribed  medications to achieve blood glucose in normal ranges: Fasting glucose 65-99 mg/dL    Expected Outcomes  Short Term: Participant verbalizes understanding of the signs/symptoms and immediate care of hyper/hypoglycemia, proper foot care and importance of medication, aerobic/resistive exercise and nutrition plan for blood glucose control.;Long Term: Attainment of HbA1C < 7%.    Hypertension  Yes    Intervention  Provide education on lifestyle modifcations including regular physical activity/exercise, weight management, moderate sodium restriction and increased consumption of fresh fruit, vegetables, and low fat dairy, alcohol moderation, and smoking cessation.;Monitor prescription use compliance.    Expected Outcomes  Short Term: Continued assessment and intervention until BP is < 140/91mHG in hypertensive participants. < 130/8062mG in hypertensive participants with diabetes, heart failure or chronic kidney disease.;Long Term: Maintenance of blood pressure at goal levels.    Lipids  Yes    Intervention  Provide education and support for participant on nutrition & aerobic/resistive exercise along with prescribed medications to achieve LDL <42m87mDL >40mg50m Expected Outcomes  Short Term: Participant states understanding of desired cholesterol values and is compliant with medications prescribed. Participant is following exercise prescription and nutrition guidelines.;Long Term: Cholesterol controlled with medications as prescribed, with individualized exercise RX and with personalized nutrition plan. Value goals: LDL < 42mg,32m > 40 mg.        Personal Goals Discharge: Goals and Risk Factor Review - 07/29/18 0835      Core Components/Risk Factors/Patient Goals Review   Personal Goals Review  Weight Management/Obesity;Diabetes;Hypertension;Lipids    Review  Susie's weight is down to 222lbs.  Her blood sugars have been between 77-120 in the morning.  She is has really been working on her diet.  She is  checking her blood pressures at home twice a day (mid morning and late afternoon).  They have been running in the 100-120s/60s.  She is asymptomatic with it.  She is doing well on her medications.     Expected Outcomes  Short: Continue to work on weight loss.  Long: Continue to monitor risk factors.        Exercise Goals and Review: Exercise Goals    Row Name 07/04/18 1434             Exercise Goals   Increase Physical Activity  Yes       Intervention  Provide advice, education, support and counseling about physical activity/exercise needs.;Develop an individualized exercise prescription for aerobic and resistive training based on initial evaluation findings, risk stratification, comorbidities and participant's personal goals.       Expected Outcomes  Short Term: Attend rehab on a regular basis to increase amount of physical activity.;Long Term: Add in home exercise to make exercise part of routine and to increase amount of physical activity.;Long Term: Exercising regularly at least 3-5 days a week.       Increase Strength and Stamina  Yes       Intervention  Provide advice, education,  support and counseling about physical activity/exercise needs.;Develop an individualized exercise prescription for aerobic and resistive training based on initial evaluation findings, risk stratification, comorbidities and participant's personal goals.       Expected Outcomes  Short Term: Increase workloads from initial exercise prescription for resistance, speed, and METs.;Short Term: Perform resistance training exercises routinely during rehab and add in resistance training at home;Long Term: Improve cardiorespiratory fitness, muscular endurance and strength as measured by increased METs and functional capacity (6MWT)       Able to understand and use rate of perceived exertion (RPE) scale  Yes       Intervention  Provide education and explanation on how to use RPE scale       Expected Outcomes  Short Term: Able  to use RPE daily in rehab to express subjective intensity level;Long Term:  Able to use RPE to guide intensity level when exercising independently       Able to understand and use Dyspnea scale  Yes       Intervention  Provide education and explanation on how to use Dyspnea scale       Expected Outcomes  Short Term: Able to use Dyspnea scale daily in rehab to express subjective sense of shortness of breath during exertion;Long Term: Able to use Dyspnea scale to guide intensity level when exercising independently       Knowledge and understanding of Target Heart Rate Range (THRR)  Yes       Intervention  Provide education and explanation of THRR including how the numbers were predicted and where they are located for reference       Expected Outcomes  Short Term: Able to state/look up THRR;Short Term: Able to use daily as guideline for intensity in rehab;Long Term: Able to use THRR to govern intensity when exercising independently       Able to check pulse independently  Yes       Intervention  Provide education and demonstration on how to check pulse in carotid and radial arteries.;Review the importance of being able to check your own pulse for safety during independent exercise       Expected Outcomes  Short Term: Able to explain why pulse checking is important during independent exercise;Long Term: Able to check pulse independently and accurately       Understanding of Exercise Prescription  Yes       Intervention  Provide education, explanation, and written materials on patient's individual exercise prescription       Expected Outcomes  Short Term: Able to explain program exercise prescription;Long Term: Able to explain home exercise prescription to exercise independently          Nutrition & Weight - Outcomes: Pre Biometrics - 07/04/18 1433      Pre Biometrics   Height  5' 2"  (1.575 m)    Weight  225 lb 11.2 oz (102.4 kg)    Waist Circumference  44 inches    Hip Circumference  55.25 inches     Waist to Hip Ratio  0.8 %    BMI (Calculated)  41.27    Single Leg Stand  1.5 seconds      Post Biometrics - 08/22/18 0823       Post  Biometrics   Height  5' 2"  (1.575 m)    Weight  218 lb (98.9 kg)    Waist Circumference  43 inches    Hip Circumference  53.5 inches    Waist to Hip Ratio  0.8 %  BMI (Calculated)  39.86    Single Leg Stand  9.49 seconds       Nutrition: Nutrition Therapy & Goals - 07/08/18 0908      Nutrition Therapy   Diet  DASH    Drug/Food Interactions  Statins/Certain Fruits    Protein (specify units)  9oz    Fiber  20 grams    Whole Grain Foods  3 servings   eats whole grain bread   Saturated Fats  12 max. grams    Fruits and Vegetables  5 servings/day   8 ideal, working to increase fruit & vegetable intake   Sodium  1500 grams      Personal Nutrition Goals   Nutrition Goal  Continue to work on making low sodium, fat and sugar food and beverage choices. Great job so far!    Personal Goal #2  When choosing plant-based protein options, make sure to check the sodium and fat content. Just because it's meat-free does not make it a heart healthy choice    Personal Goal #3  Try warming up frozen fruit or adding stevia/brown sugar blend to oatmeal, rather than adding regular brown sugar, to decrease the sugar content of this meal    Comments  She has made dramatic changes to her diet since hospitalization. 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   General guidelines for heart healthy eating handout, sodium free seasoning blend sheet, low sodium nutrition therapy handout   Expected Outcomes  Short Term Goal: A plan has been developed with personal nutrition goals set during dietitian appointment.;Long Term Goal: Adherence to prescribed nutrition plan.;Short Term Goal: Understand basic principles of dietary content, such as calories, fat, sodium, cholesterol and nutrients.       Nutrition Discharge: Nutrition Assessments - 08/23/18 0821      MEDFICTS Scores   Pre Score  6    Post Score  6    Score Difference  0       Education Questionnaire Score: Knowledge Questionnaire Score - 08/23/18 0223      Knowledge Questionnaire Score   Pre Score  22/26    Post Score  25/26   reviewed with pt today      Goals reviewed with patient; copy given to patient.

## 2018-08-28 NOTE — Progress Notes (Signed)
Daily Session Note  Patient Details  Name: Lindsey Pierce MRN: [id number] Date of Birth: [birth year] Referring Provider:     Cardiac Rehab from 07/04/2018 in [facility] Cardiac and Pulmonary Rehab  Referring Provider  End      Encounter Date: 08/28/2018  Check In: Session Check In - 08/28/18 0725      Check-In   Supervising physician immediately available to respond to emergencies  See telemetry face sheet for immediately available ER MD    Location  [facility]-Cardiac & Pulmonary Rehab    Staff Present  Alberteen Sam, MA, RCEP, CCRP, Exercise Physiologist;Bassel Gaskill [place];Heath Lark, RN, BSN, CCRP    Medication changes reported      Yes    Comments  Patient is off metformin and lisinopril.    Fall or balance concerns reported     No    Warm-up and Cool-down  Performed on first and last piece of equipment    Resistance Training Performed  Yes    VAD Patient?  No    PAD/SET Patient?  No      Pain Assessment   Currently in Pain?  No/denies          Social History   Tobacco Use  Smoking Status Never Smoker  Smokeless Tobacco Never Used    Goals Met:  Independence with exercise equipment Exercise tolerated well No report of cardiac concerns or symptoms Strength training completed today  Goals Unmet:  Not Applicable  Comments: Pt able to follow exercise prescription today without complaint.  Will continue to monitor for progression.    Dr. Emily Filbert is Medical Director for [place] and [facility].

## 2018-08-29 ENCOUNTER — Encounter: Payer: Medicare Other | Admitting: *Deleted

## 2018-08-29 DIAGNOSIS — I10 Essential (primary) hypertension: Secondary | ICD-10-CM | POA: Diagnosis not present

## 2018-08-29 DIAGNOSIS — I255 Ischemic cardiomyopathy: Secondary | ICD-10-CM | POA: Diagnosis not present

## 2018-08-29 DIAGNOSIS — Z955 Presence of coronary angioplasty implant and graft: Secondary | ICD-10-CM | POA: Diagnosis not present

## 2018-08-29 DIAGNOSIS — I213 ST elevation (STEMI) myocardial infarction of unspecified site: Secondary | ICD-10-CM

## 2018-08-29 NOTE — Progress Notes (Signed)
Daily Session Note  Patient Details  Name: Lindsey Pierce MRN: [id number] Date of Birth: August 22, 1951 Referring Provider:     Cardiac Rehab from 07/04/2018 in [facility] Cardiac and Pulmonary Rehab  Referring Provider  End      Encounter Date: 08/29/2018  Check In: Session Check In - 08/29/18 0910      Check-In   Supervising physician immediately available to respond to emergencies  See telemetry face sheet for immediately available ER MD    Location  [facility]-Cardiac & Pulmonary Rehab    Staff Present  Gerlene Burdock, RN, BSN;Armanii Pressnell Luan Pulling, MA, RCEP, CCRP, Exercise Physiologist;Joseph Tessie Fass RCP,RRT,BSRT    Medication changes reported      No    Fall or balance concerns reported     No    Warm-up and Cool-down  Performed on first and last piece of equipment    Resistance Training Performed  Yes    VAD Patient?  No    PAD/SET Patient?  No      Pain Assessment   Currently in Pain?  No/denies          Social History   Tobacco Use  Smoking Status Never Smoker  Smokeless Tobacco Never Used    Goals Met:  Independence with exercise equipment Exercise tolerated well No report of cardiac concerns or symptoms Strength training completed today  Goals Unmet:  Not Applicable  Comments: Pt able to follow exercise prescription today without complaint.  Will continue to monitor for progression.    Dr. Emily Filbert is Medical Director for [place] [facility].

## 2018-08-30 ENCOUNTER — Ambulatory Visit (HOSPITAL_COMMUNITY): Payer: Medicare Other

## 2018-08-30 DIAGNOSIS — I255 Ischemic cardiomyopathy: Secondary | ICD-10-CM | POA: Diagnosis not present

## 2018-08-30 DIAGNOSIS — Z955 Presence of coronary angioplasty implant and graft: Secondary | ICD-10-CM | POA: Diagnosis not present

## 2018-08-30 DIAGNOSIS — I213 ST elevation (STEMI) myocardial infarction of unspecified site: Secondary | ICD-10-CM

## 2018-08-30 DIAGNOSIS — I10 Essential (primary) hypertension: Secondary | ICD-10-CM | POA: Diagnosis not present

## 2018-08-30 NOTE — Progress Notes (Signed)
Discharge Progress Report  Patient Details  Name: Lindsey Pierce MRN: [id number] Date of Birth: [birth year] Referring Provider:     Cardiac Rehab from 07/04/2018 in [facility] Cardiac and Pulmonary Rehab  Referring Provider  End       Number of Visits: 36  Reason for Discharge:  Patient reached a stable level of exercise. Patient independent in their exercise. Patient has met program and personal goals.  Smoking History:  Social History   Tobacco Use  Smoking Status Never Smoker  Smokeless Tobacco Never Used    Diagnosis:  ST elevation myocardial infarction (STEMI), unspecified artery ([place])  ADL UCSD:   Initial Exercise Prescription: Initial Exercise Prescription - 07/04/18 1400      Date of Initial Exercise RX and Referring Provider   Date  07/04/18    Referring Provider  End      Treadmill   MPH  1.5    Grade  0    Minutes  15    METs  2.15      Recumbant Bike   Level  1    RPM  60    Minutes  15    METs  2      NuStep   Level  2    SPM  80    Minutes  15    METs  2      Prescription Details   Frequency (times per week)  3    Duration  Progress to 45 minutes of aerobic exercise without signs/symptoms of physical distress      Intensity   THRR 40-80% of Max Heartrate  99-135    Ratings of Perceived Exertion  11-13    Perceived Dyspnea  0-4      Resistance Training   Training Prescription  Yes    Weight  3 lb    Reps  10-15       Discharge Exercise Prescription (Final Exercise Prescription Changes): Exercise Prescription Changes - 08/26/18 1500      Response to Exercise   Blood Pressure (Admit)  132/70    Blood Pressure (Exercise)  136/60    Blood Pressure (Exit)  106/64    Heart Rate (Admit)  58 bpm    Heart Rate (Exercise)  116 bpm    Heart Rate (Exit)  82 bpm    Rating of Perceived Exertion (Exercise)  13    Symptoms  none    Duration  Continue with 45 min of aerobic exercise without signs/symptoms of physical distress.    Intensity  THRR  unchanged      Progression   Progression  Continue to progress workloads to maintain intensity without signs/symptoms of physical distress.    Average METs  2.81      Resistance Training   Training Prescription  Yes    Weight  4 lbs    Reps  10-15      Interval Training   Interval Training  No      Treadmill   MPH  2    Grade  2.5    Minutes  15    METs  3.22      NuStep   Level  6    Minutes  15    METs  2.8      Arm Ergometer   Level  8.5    Minutes  15    METs  2.4      Home Exercise Plan   Plans to continue exercise at  Home (comment)  walking   Frequency  Add 2 additional days to program exercise sessions.    Initial Home Exercises Provided  07/12/18       Functional Capacity: 6 Minute Walk    Row Name 07/04/18 1436 08/22/18 0816       6 Minute Walk   Phase  -  Discharge    Distance  980 feet  1300 feet    Distance % Change  -  32 %    Distance Feet Change  -  300 ft    Walk Time  6 minutes  6 minutes    # of Rest Breaks  0  0    MPH  1.85  2.46    METS  2.06  3.22    RPE  12  13    Perceived Dyspnea   0  0    VO2 Peak  7.22  11.28    Symptoms  No  No    Resting HR  64 bpm  75 bpm    Resting BP  114/62  126/64    Resting Oxygen Saturation   96 %  98 %    Exercise Oxygen Saturation  during 6 min walk  97 %  99 %    Max Ex. HR  115 bpm  123 bpm    Max Ex. BP  154/74  202/86    2 Minute Post BP  120/74  162/84       Psychological, QOL, Others - Outcomes: PHQ 2/9: Depression screen [facility] 2/9 08/23/2018 07/04/2018  Decreased Interest 0 0  Down, Depressed, Hopeless 0 0  PHQ - 2 Score 0 0  Altered sleeping 1 1  Tired, decreased energy 1 1  Change in appetite 0 0  Feeling bad or failure about yourself  0 0  Trouble concentrating 0 0  Moving slowly or fidgety/restless 0 0  Suicidal thoughts 0 0  PHQ-9 Score 2 2  Difficult doing work/chores Not difficult at all Not difficult at all    Quality of Life: Quality of Life - 08/23/18 0821       Quality of Life   Select  Quality of Life      Quality of Life Scores   Health/Function Pre  26.79 %    Health/Function Post  27.75 %    Health/Function % Change  3.58 %    Socioeconomic Pre  24.5 %    Socioeconomic Post  27 %    Socioeconomic % Change   10.2 %    Psych/Spiritual Pre  28.57 %    Psych/Spiritual Post  28.57 %    Psych/Spiritual % Change  0 %    Family Pre  28.5 %    Family Post  28.57 %    Family % Change  0.25 %    GLOBAL Pre  26.97 %    GLOBAL Post  28.08 %    GLOBAL % Change  4.12 %       Personal Goals: Goals established at orientation with interventions provided to work toward goal. Personal Goals and Risk Factors at Admission - 07/04/18 1306      Core Components/Risk Factors/Patient Goals on Admission    Weight Management  Yes;Obesity;Weight Loss    Intervention  Weight Management: Develop a combined nutrition and exercise program designed to reach desired caloric intake, while maintaining appropriate intake of nutrient and fiber, sodium and fats, and appropriate energy expenditure required for the weight goal.;Weight Management: Provide education and appropriate resources  to help participant work on and attain dietary goals.;Weight Management/Obesity: Establish reasonable short term and long term weight goals.;Obesity: Provide education and appropriate resources to help participant work on and attain dietary goals.    Admit Weight  225 lb (102.1 kg)    Goal Weight: Short Term  220 lb (99.8 kg)    Goal Weight: Long Term  160 lb (72.6 kg)    Expected Outcomes  Short Term: Continue to assess and modify interventions until short term weight is achieved;Long Term: Adherence to nutrition and physical activity/exercise program aimed toward attainment of established weight goal;Weight Loss: Understanding of general recommendations for a balanced deficit meal plan, which promotes 1-2 lb weight loss per week and includes a negative energy balance of [phone]  kcal/d;Understanding recommendations for meals to include 15-35% energy as protein, 25-35% energy from fat, 35-60% energy from carbohydrates, less than '200mg'$  of dietary cholesterol, 20-35 gm of total fiber daily;Understanding of distribution of calorie intake throughout the day with the consumption of 4-5 meals/snacks    Diabetes  Yes    Intervention  Provide education about signs/symptoms and action to take for hypo/hyperglycemia.;Provide education about proper nutrition, including hydration, and aerobic/resistive exercise prescription along with prescribed medications to achieve blood glucose in normal ranges: Fasting glucose 65-99 mg/dL    Expected Outcomes  Short Term: Participant verbalizes understanding of the signs/symptoms and immediate care of hyper/hypoglycemia, proper foot care and importance of medication, aerobic/resistive exercise and nutrition plan for blood glucose control.;Long Term: Attainment of HbA1C < 7%.    Hypertension  Yes    Intervention  Provide education on lifestyle modifcations including regular physical activity/exercise, weight management, moderate sodium restriction and increased consumption of fresh fruit, vegetables, and low fat dairy, alcohol moderation, and smoking cessation.;Monitor prescription use compliance.    Expected Outcomes  Short Term: Continued assessment and intervention until BP is < 140/37m HG in hypertensive participants. < 130/81mHG in hypertensive participants with diabetes, heart failure or chronic kidney disease.;Long Term: Maintenance of blood pressure at goal levels.    Lipids  Yes    Intervention  Provide education and support for participant on nutrition & aerobic/resistive exercise along with prescribed medications to achieve LDL '70mg'$ , HDL >'40mg'$ .    Expected Outcomes  Short Term: Participant states understanding of desired cholesterol values and is compliant with medications prescribed. Participant is following exercise prescription and  nutrition guidelines.;Long Term: Cholesterol controlled with medications as prescribed, with individualized exercise RX and with personalized nutrition plan. Value goals: LDL < '70mg'$ , HDL > 40 mg.        Personal Goals Discharge: Goals and Risk Factor Review    Row Name 07/29/18 0835             Core Components/Risk Factors/Patient Goals Review   Personal Goals Review  Weight Management/Obesity;Diabetes;Hypertension;Lipids       Review  Susie's weight is down to 222lbs.  Her blood sugars have been between 77-120 in the morning.  She is has really been working on her diet.  She is checking her blood pressures at home twice a day (mid morning and late afternoon).  They have been running in the 100-120s/60s.  She is asymptomatic with it.  She is doing well on her medications.        Expected Outcomes  Short: Continue to work on weight loss.  Long: Continue to monitor risk factors.           Exercise Goals and Review: Exercise Goals    Row  Name 07/04/18 1434             Exercise Goals   Increase Physical Activity  Yes       Intervention  Provide advice, education, support and counseling about physical activity/exercise needs.;Develop an individualized exercise prescription for aerobic and resistive training based on initial evaluation findings, risk stratification, comorbidities and participant's personal goals.       Expected Outcomes  Short Term: Attend rehab on a regular basis to increase amount of physical activity.;Long Term: Add in home exercise to make exercise part of routine and to increase amount of physical activity.;Long Term: Exercising regularly at least 3-5 days a week.       Increase Strength and Stamina  Yes       Intervention  Provide advice, education, support and counseling about physical activity/exercise needs.;Develop an individualized exercise prescription for aerobic and resistive training based on initial evaluation findings, risk stratification, comorbidities and  participant's personal goals.       Expected Outcomes  Short Term: Increase workloads from initial exercise prescription for resistance, speed, and METs.;Short Term: Perform resistance training exercises routinely during rehab and add in resistance training at home;Long Term: Improve cardiorespiratory fitness, muscular endurance and strength as measured by increased METs and functional capacity (6MWT)       Able to understand and use rate of perceived exertion (RPE) scale  Yes       Intervention  Provide education and explanation on how to use RPE scale       Expected Outcomes  Short Term: Able to use RPE daily in rehab to express subjective intensity level;Long Term:  Able to use RPE to guide intensity level when exercising independently       Able to understand and use Dyspnea scale  Yes       Intervention  Provide education and explanation on how to use Dyspnea scale       Expected Outcomes  Short Term: Able to use Dyspnea scale daily in rehab to express subjective sense of shortness of breath during exertion;Long Term: Able to use Dyspnea scale to guide intensity level when exercising independently       Knowledge and understanding of Target Heart Rate Range (THRR)  Yes       Intervention  Provide education and explanation of THRR including how the numbers were predicted and where they are located for reference       Expected Outcomes  Short Term: Able to state/look up THRR;Short Term: Able to use daily as guideline for intensity in rehab;Long Term: Able to use THRR to govern intensity when exercising independently       Able to check pulse independently  Yes       Intervention  Provide education and demonstration on how to check pulse in carotid and radial arteries.;Review the importance of being able to check your own pulse for safety during independent exercise       Expected Outcomes  Short Term: Able to explain why pulse checking is important during independent exercise;Long Term: Able to check  pulse independently and accurately       Understanding of Exercise Prescription  Yes       Intervention  Provide education, explanation, and written materials on patient's individual exercise prescription       Expected Outcomes  Short Term: Able to explain program exercise prescription;Long Term: Able to explain home exercise prescription to exercise independently          Nutrition & Weight -  Outcomes: Pre Biometrics - 07/04/18 1433      Pre Biometrics   Height  '5\' 2"'$  (1.575 m)    Weight  225 lb 11.2 oz (102.4 kg)    Waist Circumference  44 inches    Hip Circumference  55.25 inches    Waist to Hip Ratio  0.8 %    BMI (Calculated)  41.27    Single Leg Stand  1.5 seconds      Post Biometrics - 08/22/18 3790       Post  Biometrics   Height  '5\' 2"'$  (1.575 m)    Weight  218 lb (98.9 kg)    Waist Circumference  43 inches    Hip Circumference  53.5 inches    Waist to Hip Ratio  0.8 %    BMI (Calculated)  39.86    Single Leg Stand  9.49 seconds       Nutrition: Nutrition Therapy & Goals - 07/08/18 0908      Nutrition Therapy   Diet  DASH    Drug/Food Interactions  Statins/Certain Fruits    Protein (specify units)  9oz    Fiber  20 grams    Whole Grain Foods  3 servings   eats whole grain bread   Saturated Fats  12 max. grams    Fruits and Vegetables  5 servings/day   8 ideal, working to increase fruit & vegetable intake   Sodium  1500 grams      Personal Nutrition Goals   Nutrition Goal  Continue to work on making low sodium, fat and sugar food and beverage choices. Great job so far!    Personal Goal #2  When choosing plant-based protein options, make sure to check the sodium and fat content. Just because it's meat-free does not make it a heart healthy choice    Personal Goal #3  Try warming up frozen fruit or adding stevia/brown sugar blend to oatmeal, rather than adding regular brown sugar, to decrease the sugar content of this meal    Comments  She has made dramatic  changes to her diet since hospitalization. 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   General guidelines for heart healthy eating handout, sodium free seasoning blend sheet, low sodium nutrition therapy handout   Expected Outcomes  Short Term Goal: A plan has been developed with personal nutrition goals set during dietitian appointment.;Long Term Goal: Adherence to prescribed nutrition plan.;Short Term Goal: Understand basic principles of dietary content, such as calories, fat, sodium, cholesterol and nutrients.       Nutrition Discharge: Nutrition Assessments - 08/23/18 0821      MEDFICTS Scores   Pre Score  6    Post Score  6    Score Difference  0       Education Questionnaire Score: Knowledge Questionnaire Score - 08/23/18 2409      Knowledge Questionnaire Score   Pre Score  22/26    Post Score  25/26   reviewed with pt today  Goals reviewed with patient; copy given to patient.

## 2018-08-30 NOTE — Progress Notes (Signed)
Daily Session Note  Patient Details  Name: Lindsey Pierce MRN: [id number] Date of Birth: 1950/12/03 Referring Provider:     Cardiac Rehab from 07/04/2018 in [facility] Cardiac and Pulmonary Rehab  Referring Provider  End      Encounter Date: 08/30/2018  Check In:      Social History   Tobacco Use  Smoking Status Never Smoker  Smokeless Tobacco Never Used    Goals Met:  Independence with exercise equipment Exercise tolerated well No report of cardiac concerns or symptoms Strength training completed today  Goals Unmet:  Not Applicable  Comments:  Lindsey Pierce graduated today from  rehab with 36 sessions completed.  Details of the patient's exercise prescription and what She needs to do in order to continue the prescription and progress were discussed with patient.  Patient was given a copy of prescription and goals.  Patient verbalized understanding.  Lindsey Pierce plans to continue to exercise by going to the [facility].    Dr. Emily Filbert is Medical Director for [place] and [facility].

## 2018-08-30 NOTE — Progress Notes (Signed)
Cardiac Individual Treatment Plan  Patient Details  Name: Lindsey Pierce MRN: [id number] Date of Birth: [birth year] Referring Provider:     Cardiac Rehab from 07/04/2018 in [facility] Cardiac and Pulmonary Rehab  Referring Provider  End      Initial Encounter Date:    Cardiac Rehab from 07/04/2018 in [facility] Cardiac and Pulmonary Rehab  Date  07/04/18      Visit Diagnosis: ST elevation myocardial infarction (STEMI), unspecified artery ([place])  Patient's Home Medications on Admission:  Current Outpatient Medications:  .  amiodarone (PACERONE) 200 MG tablet, Take 1 tablet (200 mg total) by mouth daily., Disp: 90 tablet, Rfl: 3 .  aspirin 81 MG chewable tablet, Chew 1 tablet (81 mg total) by mouth daily., Disp: , Rfl:  .  atorvastatin (LIPITOR) 80 MG tablet, Take 1 tablet (80 mg total) by mouth daily at 6 PM., Disp: 90 tablet, Rfl: 3 .  carvedilol (COREG) 6.25 MG tablet, Take 1 tablet (6.25 mg total) by mouth 2 (two) times daily., Disp: 180 tablet, Rfl: 3 .  ketoconazole (NIZORAL) 2 % cream, Apply once daily to affected area x2 weeks., Disp: , Rfl:  .  lisinopril (PRINIVIL,ZESTRIL) 5 MG tablet, TAKE 1 TABLET BY MOUTH EVERY DAY, Disp: 30 tablet, Rfl: 2 .  Multiple Vitamin (MULTIVITAMIN WITH MINERALS) TABS, Take 1 tablet by mouth daily., Disp: , Rfl:  .  nitroGLYCERIN (NITROSTAT) 0.4 MG SL tablet, Place 1 tablet (0.4 mg total) under the tongue every 5 (five) minutes as needed. (Patient not taking: Reported on 08/27/2018), Disp: 25 tablet, Rfl: 2 .  ticagrelor (BRILINTA) 90 MG TABS tablet, Take 1 tablet (90 mg total) by mouth 2 (two) times daily., Disp: 180 tablet, Rfl: 2 No current facility-administered medications for this visit.   Facility-Administered Medications Ordered in Other Visits:  .  0.9 %  sodium chloride infusion, , , Continuous PRN, End, Christopher, MD, Last Rate: 999 mL/hr at 05/31/18 2102, 999 mL/hr at 05/31/18 2102 .  amiodarone (NEXTERONE PREMIX) 360-4.14 MG/200ML-% (1.8 mg/mL) IV  infusion, , , Continuous PRN, End, Christopher, MD, Last Rate: 33.3 mL/hr at 05/31/18 2130, 60 mg/hr at 05/31/18 2130 .  amiodarone (NEXTERONE) 1.8 mg/mL load via infusion, , , PRN, End, Christopher, MD, 150 mg at 05/31/18 2123 .  fentaNYL (SUBLIMAZE) injection, , , PRN, End, Christopher, MD, 25 mcg at 05/31/18 2126 .  heparin injection, , , PRN, End, Harrell Gave, MD, 5,000 Units at 05/31/18 2105 .  lidocaine (PF) (XYLOCAINE) 1 % injection, , , PRN, End, Christopher, MD, 2 mL at 05/31/18 2058 .  midazolam (VERSED) injection, , , PRN, End, Christopher, MD, 1 mg at 05/31/18 2126 .  Radial Cocktail/Verapamil only, , , PRN, End, Christopher, MD, 10 mL at 05/31/18 2059  Past Medical History: Past Medical History:  Diagnosis Date  . CAD (coronary artery disease)    a. 05/2018 Ant STEMI/PCI: LM nl, LAD 36m2.75 x 16 Synergy DES), D2 99 (PTCA), RI 40ost, LCX mild diff dzs, OM1/2/3 nl, RCA large, diffuse dzs, RPDA nl, RPAV 60, RPLB1 80 (small).  . Diabetes ([place]   . HFrEF (heart failure with reduced ejection fraction) ([place]    a. 05/2018 Echo: EF 40-45%, mod LVH, Gr2 DD, mid antsept, apical septal, apical AK. Mid-apical ant, mid infsept HK. Nl RV fxn. Triv TR.   .Marland KitchenHyperlipidemia   . Hypertension   . Ischemic cardiomyopathy    a. 05/2018 Echo: EF 40-45%.  .Marland KitchenPAF (paroxysmal atrial fibrillation) ([place]    a. 05/2018 at time  of STEMI, converted on Amio. No OAC.  Marland Kitchen STEMI involving left anterior descending coronary artery ([place])    a. 12/8313 - complicated by CGS req IABP and VT req amiodarone.  . V-tach ([place])    a. 05/2018 at time of Ant STEMI-->Amio.    Tobacco Use: Social History   Tobacco Use  Smoking Status Never Smoker  Smokeless Tobacco Never Used    Labs: Recent Review Flowsheet Data    Labs for ITP Cardiac and Pulmonary Rehab Latest Ref Rng & Units 05/31/2018 06/02/2018 06/04/2018 08/08/2018 08/26/2018   Cholestrol 0 - 200 mg/dL - - - 118 -   LDLCALC 0 - 99 mg/dL - - - 62 -   HDL >40 mg/dL - -  - 29(L) -   Trlycerides <150 mg/dL - - - 135 -   Hemoglobin A1c 4.6 - 6.5 % - - 7.1(H) - 5.7   PHART 7.350 - 7.450 7.299(L) - - - -   PCO2ART 32.0 - 48.0 mmHg 48.3(H) - - - -   HCO3 20.0 - 28.0 mmol/L 23.7 - - - -   TCO2 22 - 32 mmol/L 25 - - - -   ACIDBASEDEF 0.0 - 2.0 mmol/L 3.0(H) - - - -   O2SAT % 97.0 62.2 - - -       Exercise Target Goals: Exercise Program Goal: Individual exercise prescription set using results from initial 6 min walk test and THRR while considering  patient's activity barriers and safety.   Exercise Prescription Goal: Initial exercise prescription builds to 30-45 minutes a day of aerobic activity, 2-3 days per week.  Home exercise guidelines will be given to patient during program as part of exercise prescription that the participant will acknowledge.  Activity Barriers & Risk Stratification: Activity Barriers & Cardiac Risk Stratification - 07/04/18 1312      Activity Barriers & Cardiac Risk Stratification   Activity Barriers  Other (comment)    Comments  inner ear issues, sometimes has issues bending down     Cardiac Risk Stratification  High       6 Minute Walk: 6 Minute Walk    Row Name 07/04/18 1436 08/22/18 0816       6 Minute Walk   Phase  -  Discharge    Distance  980 feet  1300 feet    Distance % Change  -  32 %    Distance Feet Change  -  300 ft    Walk Time  6 minutes  6 minutes    # of Rest Breaks  0  0    MPH  1.85  2.46    METS  2.06  3.22    RPE  12  13    Perceived Dyspnea   0  0    VO2 Peak  7.22  11.28    Symptoms  No  No    Resting HR  64 bpm  75 bpm    Resting BP  114/62  126/64    Resting Oxygen Saturation   96 %  98 %    Exercise Oxygen Saturation  during 6 min walk  97 %  99 %    Max Ex. HR  115 bpm  123 bpm    Max Ex. BP  154/74  202/86    2 Minute Post BP  120/74  162/84       Oxygen Initial Assessment:   Oxygen Re-Evaluation:   Oxygen Discharge (Final Oxygen Re-Evaluation):   Initial  Exercise  Prescription: Initial Exercise Prescription - 07/04/18 1400      Date of Initial Exercise RX and Referring Provider   Date  07/04/18    Referring Provider  End      Treadmill   MPH  1.5    Grade  0    Minutes  15    METs  2.15      Recumbant Bike   Level  1    RPM  60    Minutes  15    METs  2      NuStep   Level  2    SPM  80    Minutes  15    METs  2      Prescription Details   Frequency (times per week)  3    Duration  Progress to 45 minutes of aerobic exercise without signs/symptoms of physical distress      Intensity   THRR 40-80% of Max Heartrate  99-135    Ratings of Perceived Exertion  11-13    Perceived Dyspnea  0-4      Resistance Training   Training Prescription  Yes    Weight  3 lb    Reps  10-15       Perform Capillary Blood Glucose checks as needed.  Exercise Prescription Changes: Exercise Prescription Changes    Row Name 07/04/18 1300 07/12/18 0800 07/17/18 0900 07/29/18 1500 08/12/18 1400     Response to Exercise   Blood Pressure (Admit)  114/62  -  146/74  138/64  128/60   Blood Pressure (Exercise)  154/74  -  138/78  162/80  138/64   Blood Pressure (Exit)  120/74  -  126/67  126/64  120/60   Heart Rate (Admit)  66 bpm  -  69 bpm  62 bpm  65 bpm   Heart Rate (Exercise)  115 bpm  -  109 bpm  116 bpm  118 bpm   Heart Rate (Exit)  90 bpm  -  71 bpm  70 bpm  71 bpm   Oxygen Saturation (Admit)  96 %  -  -  -  -   Oxygen Saturation (Exercise)  96 %  -  -  -  -   Rating of Perceived Exertion (Exercise)  12  -  12  12  12    Perceived Dyspnea (Exercise)  0  -  -  -  -   Symptoms  -  -  none  none  none   Duration  -  -  Continue with 45 min of aerobic exercise without signs/symptoms of physical distress.  Continue with 45 min of aerobic exercise without signs/symptoms of physical distress.  Continue with 45 min of aerobic exercise without signs/symptoms of physical distress.   Intensity  -  -  THRR unchanged  THRR unchanged  THRR unchanged      Progression   Progression  -  -  Continue to progress workloads to maintain intensity without signs/symptoms of physical distress.  Continue to progress workloads to maintain intensity without signs/symptoms of physical distress.  Continue to progress workloads to maintain intensity without signs/symptoms of physical distress.   Average METs  -  -  2.02  2.38  2.93     Resistance Training   Training Prescription  -  -  Yes  Yes  Yes   Weight  -  -  3 lbs  3 lbs  3 lbs   Reps  -  -  10-15  10-15  10-15     Interval Training   Interval Training  -  -  No  No  No     Treadmill   MPH  -  -  1.5  1.7  1.9   Grade  -  -  0  1  2   Minutes  -  -  15  15  15    METs  -  -  2.15  2.54  2.98     NuStep   Level  -  -  4  6  6    Minutes  -  -  15  15  15    METs  -  -  2.4  2.6  3.4     Arm Ergometer   Level  -  -  1  3  6.5   Minutes  -  -  15  15  15    METs  -  -  1.5  2  2.4     Home Exercise Plan   Plans to continue exercise at  -  Home (comment) walking  Home (comment) walking  Home (comment) walking  Home (comment) walking   Frequency  -  Add 2 additional days to program exercise sessions.  Add 2 additional days to program exercise sessions.  Add 2 additional days to program exercise sessions.  Add 2 additional days to program exercise sessions.   Initial Home Exercises Provided  -  07/12/18  07/12/18  07/12/18  07/12/18   Row Name 08/26/18 1500             Response to Exercise   Blood Pressure (Admit)  132/70       Blood Pressure (Exercise)  136/60       Blood Pressure (Exit)  106/64       Heart Rate (Admit)  58 bpm       Heart Rate (Exercise)  116 bpm       Heart Rate (Exit)  82 bpm       Rating of Perceived Exertion (Exercise)  13       Symptoms  none       Duration  Continue with 45 min of aerobic exercise without signs/symptoms of physical distress.       Intensity  THRR unchanged         Progression   Progression  Continue to progress workloads to maintain intensity  without signs/symptoms of physical distress.       Average METs  2.81         Resistance Training   Training Prescription  Yes       Weight  4 lbs       Reps  10-15         Interval Training   Interval Training  No         Treadmill   MPH  2       Grade  2.5       Minutes  15       METs  3.22         NuStep   Level  6       Minutes  15       METs  2.8         Arm Ergometer   Level  8.5       Minutes  15       METs  2.4         Home  Exercise Plan   Plans to continue exercise at  Home (comment) walking       Frequency  Add 2 additional days to program exercise sessions.       Initial Home Exercises Provided  07/12/18          Exercise Comments: Exercise Comments    Row Name 07/08/18 0801           Exercise Comments   First full day of exercise!  Patient was oriented to gym and equipment including functions, settings, policies, and procedures.  Patient's individual exercise prescription and treatment plan were reviewed.  All starting workloads were established based on the results of the 6 minute walk test done at initial orientation visit.  The plan for exercise progression was also introduced and progression will be customized based on patient's performance and goals.          Exercise Goals and Review: Exercise Goals    Row Name 07/04/18 1434             Exercise Goals   Increase Physical Activity  Yes       Intervention  Provide advice, education, support and counseling about physical activity/exercise needs.;Develop an individualized exercise prescription for aerobic and resistive training based on initial evaluation findings, risk stratification, comorbidities and participant's personal goals.       Expected Outcomes  Short Term: Attend rehab on a regular basis to increase amount of physical activity.;Long Term: Add in home exercise to make exercise part of routine and to increase amount of physical activity.;Long Term: Exercising regularly at least 3-5 days  a week.       Increase Strength and Stamina  Yes       Intervention  Provide advice, education, support and counseling about physical activity/exercise needs.;Develop an individualized exercise prescription for aerobic and resistive training based on initial evaluation findings, risk stratification, comorbidities and participant's personal goals.       Expected Outcomes  Short Term: Increase workloads from initial exercise prescription for resistance, speed, and METs.;Short Term: Perform resistance training exercises routinely during rehab and add in resistance training at home;Long Term: Improve cardiorespiratory fitness, muscular endurance and strength as measured by increased METs and functional capacity (6MWT)       Able to understand and use rate of perceived exertion (RPE) scale  Yes       Intervention  Provide education and explanation on how to use RPE scale       Expected Outcomes  Short Term: Able to use RPE daily in rehab to express subjective intensity level;Long Term:  Able to use RPE to guide intensity level when exercising independently       Able to understand and use Dyspnea scale  Yes       Intervention  Provide education and explanation on how to use Dyspnea scale       Expected Outcomes  Short Term: Able to use Dyspnea scale daily in rehab to express subjective sense of shortness of breath during exertion;Long Term: Able to use Dyspnea scale to guide intensity level when exercising independently       Knowledge and understanding of Target Heart Rate Range (THRR)  Yes       Intervention  Provide education and explanation of THRR including how the numbers were predicted and where they are located for reference       Expected Outcomes  Short Term: Able to state/look up THRR;Short Term: Able to use daily as guideline for intensity  in rehab;Long Term: Able to use THRR to govern intensity when exercising independently       Able to check pulse independently  Yes       Intervention   Provide education and demonstration on how to check pulse in carotid and radial arteries.;Review the importance of being able to check your own pulse for safety during independent exercise       Expected Outcomes  Short Term: Able to explain why pulse checking is important during independent exercise;Long Term: Able to check pulse independently and accurately       Understanding of Exercise Prescription  Yes       Intervention  Provide education, explanation, and written materials on patient's individual exercise prescription       Expected Outcomes  Short Term: Able to explain program exercise prescription;Long Term: Able to explain home exercise prescription to exercise independently          Exercise Goals Re-Evaluation : Exercise Goals Re-Evaluation    Row Name 07/08/18 0802 07/12/18 0834 07/17/18 0946 07/29/18 0831 08/12/18 1426     Exercise Goal Re-Evaluation   Exercise Goals Review  Increase Physical Activity;Increase Strength and Stamina;Able to understand and use rate of perceived exertion (RPE) scale;Knowledge and understanding of Target Heart Rate Range (THRR);Understanding of Exercise Prescription  Increase Physical Activity;Increase Strength and Stamina;Able to understand and use rate of perceived exertion (RPE) scale;Knowledge and understanding of Target Heart Rate Range (THRR);Able to check pulse independently;Understanding of Exercise Prescription  Increase Physical Activity;Increase Strength and Stamina;Understanding of Exercise Prescription  Increase Physical Activity;Increase Strength and Stamina;Understanding of Exercise Prescription  Increase Physical Activity;Increase Strength and Stamina;Understanding of Exercise Prescription   Comments  Reviewed RPE scale, THR and program prescription with pt today.  Pt voiced understanding and was given a copy of goals to take home.   Reviewed home exercise with pt today.  Pt plans to walk at home for exercise.  Reviewed THR, pulse, RPE, sign  and symptoms, NTG use, and when to call 911 or MD.  Also discussed weather considerations and indoor options.  Pt voiced understanding.  Susie is off to a good start in rehab.  She is already up to level 4 on the NuStep.  We will continue to monitor her progression.   Susie is doing well in rehab.  Her biggest hinderance is the treadmill, it is her hardest piece but she has been able to move up consistently.  She is exercising at home by walking and doing weights.  She goes for 23mn in the early.  We  talked about increase to 346m of walking.   Susie continues to do well in rehab. She continues to try to conquer the treadmill.  She is getting there step by step as she is now up to 1.9 mph.  She is also doing level 6.5 on the arm crank already!!  We will continue to monitor her progress.    Expected Outcomes  Short: Use RPE daily to regulate intensity. Long: Follow program prescription in THR.  Short: Start adding more walking at home, up to 30 min.  Long: Start to tackle her hill!!  Short: Continue to attend rehab regularly.  Long: Continue to improve strength and stamina.   Short: Increase walking time to 3018m  Long: Continue to increase activity levels.   Short: Continue to increase treadmill.  Long: Continue to walk more at home.    RowKayceeme 08/26/18 1513  Exercise Goal Re-Evaluation   Exercise Goals Review  Increase Physical Activity;Increase Strength and Stamina;Understanding of Exercise Prescription       Comments  Daine Floras has been doing well in rehab.  She will be graduating on Friday (a little early) to attend her annual fishing trip for the blind.  She is up to level 8.5 on the arm crank and improved her post 6MWT by 300 ft.  She is planning to continue to exercise by walking at home and is considering joining [employer] as well.        Expected Outcomes  Short: Graduation  Long: Continue to exercise on her own.           Discharge Exercise Prescription (Final Exercise  Prescription Changes): Exercise Prescription Changes - 08/26/18 1500      Response to Exercise   Blood Pressure (Admit)  132/70    Blood Pressure (Exercise)  136/60    Blood Pressure (Exit)  106/64    Heart Rate (Admit)  58 bpm    Heart Rate (Exercise)  116 bpm    Heart Rate (Exit)  82 bpm    Rating of Perceived Exertion (Exercise)  13    Symptoms  none    Duration  Continue with 45 min of aerobic exercise without signs/symptoms of physical distress.    Intensity  THRR unchanged      Progression   Progression  Continue to progress workloads to maintain intensity without signs/symptoms of physical distress.    Average METs  2.81      Resistance Training   Training Prescription  Yes    Weight  4 lbs    Reps  10-15      Interval Training   Interval Training  No      Treadmill   MPH  2    Grade  2.5    Minutes  15    METs  3.22      NuStep   Level  6    Minutes  15    METs  2.8      Arm Ergometer   Level  8.5    Minutes  15    METs  2.4      Home Exercise Plan   Plans to continue exercise at  Home (comment)   walking   Frequency  Add 2 additional days to program exercise sessions.    Initial Home Exercises Provided  07/12/18       Nutrition:  Target Goals: Understanding of nutrition guidelines, daily intake of sodium <1567m, cholesterol <2027m calories 30% from fat and 7% or less from saturated fats, daily to have 5 or more servings of fruits and vegetables.  Biometrics: Pre Biometrics - 07/04/18 1433      Pre Biometrics   Height  5' 2"  (1.575 m)    Weight  225 lb 11.2 oz (102.4 kg)    Waist Circumference  44 inches    Hip Circumference  55.25 inches    Waist to Hip Ratio  0.8 %    BMI (Calculated)  41.27    Single Leg Stand  1.5 seconds      Post Biometrics - 08/22/18 0823       Post  Biometrics   Height  5' 2"  (1.575 m)    Weight  218 lb (98.9 kg)    Waist Circumference  43 inches    Hip Circumference  53.5 inches    Waist to Hip Ratio  0.8 %  BMI (Calculated)  39.86    Single Leg Stand  9.49 seconds       Nutrition Therapy Plan and Nutrition Goals: Nutrition Therapy & Goals - 07/08/18 0908      Nutrition Therapy   Diet  DASH    Drug/Food Interactions  Statins/Certain Fruits    Protein (specify units)  9oz    Fiber  20 grams    Whole Grain Foods  3 servings   eats whole grain bread   Saturated Fats  12 max. grams    Fruits and Vegetables  5 servings/day   8 ideal, working to increase fruit & vegetable intake   Sodium  1500 grams      Personal Nutrition Goals   Nutrition Goal  Continue to work on making low sodium, fat and sugar food and beverage choices. Great job so far!    Personal Goal #2  When choosing plant-based protein options, make sure to check the sodium and fat content. Just because it's meat-free does not make it a heart healthy choice    Personal Goal #3  Try warming up frozen fruit or adding stevia/brown sugar blend to oatmeal, rather than adding regular brown sugar, to decrease the sugar content of this meal    Comments  She has made dramatic changes to her diet since hospitalization. 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   General guidelines for heart healthy eating handout, sodium free seasoning blend sheet, low sodium nutrition therapy handout   Expected Outcomes  Short Term Goal: A plan has been developed with personal nutrition goals set during dietitian appointment.;Long Term  Goal: Adherence to prescribed nutrition plan.;Short Term Goal: Understand basic principles of dietary content, such as calories, fat, sodium, cholesterol and nutrients.       Nutrition Assessments: Nutrition Assessments - 08/23/18 0821      MEDFICTS Scores   Pre Score  6    Post Score  6    Score Difference  0       Nutrition Goals Re-Evaluation: Nutrition Goals Re-Evaluation    Row Name 07/08/18 0920 08/05/18 0918           Goals   Nutrition Goal  Continue to work on making low sodium, fat and sugar food and beverage choices. Great job so far!   Continue to work on making low sodium, sugar and fat choices, great job! For example, warm up frozen fruit to top oatmeal with. Remember to monitor sodium even on plant-based options      Comment  She reports to be eating less than 1529m of sodium per day, chooses zero sugar beverages and low sugar snacks, and has cut out fatty meats. She is no longer eating canned items or most packaged foods. She is being much more mindful about her diet overall  She has been trying to stay below 12091msodium/day and has been monitoring sugar and fat intake. Total wt loss of 30# since July 2019. She has been eating peaches or whole wheat/low sodium Triscuits  and hummus as snacks. She also puchased a water infuser to flavor beverages. She is not always hungry early in the morning      Expected Outcome  She will continue to educate herself on low sodium, sugar and fat options, continue to lose weight, and improve her overall health as desired  She will continue to implement current diet/ lifestyle changes. She plans to try Premier Protein shakes as a breakfast option when she has to get up early and is not hungry.        Personal Goal #2 Re-Evaluation   Personal Goal #2  When choosing plant-based protein options, make sure to check the sodium and fat content. Just because it's meat-free does not make it a heart healthy choice  -        Personal Goal #3  Re-Evaluation   Personal Goal #3  Try warming up frozen fruit or adding stevia/brown sugar blend to oatmeal, rather than adding regular brown sugar, to decrease the sugar content of this meal  -         Nutrition Goals Discharge (Final Nutrition Goals Re-Evaluation): Nutrition Goals Re-Evaluation - 08/05/18 0918      Goals   Nutrition Goal  Continue to work on making low sodium, sugar and fat choices, great job! For example, warm up frozen fruit to top oatmeal with. Remember to monitor sodium even on plant-based options    Comment  She has been trying to stay below 1219m sodium/day and has been monitoring sugar and fat intake. Total wt loss of 30# since July 2019. She has been eating peaches or whole wheat/low sodium Triscuits and hummus as snacks. She also puchased a water infuser to flavor beverages. She is not always hungry early in the morning    Expected Outcome  She will continue to implement current diet/ lifestyle changes. She plans to try Premier Protein shakes as a breakfast option when she has to get up early and is not hungry.       Psychosocial: Target Goals: Acknowledge presence or absence of significant depression and/or stress, maximize coping skills, provide positive support system. Participant is able to verbalize types and ability to use techniques and skills needed for reducing stress and depression.   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 She has anxiety about her heart and how she will feel while exercising. She has started walking at home. She feels like this is her "second chance at life" after her heart attack and is trying to make changes. She is very motivated to begin this "heart  healthy lifestyle" and reported feeling a lot more confident in herself after orientation.       Family Dynamics   Good Support System?  Yes   Family, Friends, [employer], neighbors     Barriers   Psychosocial barriers to participate in program  There are no identifiable barriers or psychosocial needs.      Screening Interventions   Interventions  Encouraged to exercise;Program counselor consult;To provide support and resources with identified psychosocial needs;Provide feedback about the scores to participant    Expected Outcomes  Short Term goal: Utilizing psychosocial counselor, staff and physician to assist with  identification of specific Stressors or current issues interfering with healing process. Setting desired goal for each stressor or current issue identified.;Long Term Goal: Stressors or current issues are controlled or eliminated.;Short Term goal: Identification and review with participant of any Quality of Life or Depression concerns found by scoring the questionnaire.;Long Term goal: The participant improves quality of Life and PHQ9 Scores as seen by post scores and/or verbalization of changes       Quality of Life Scores:  Quality of Life - 08/23/18 0821      Quality of Life   Select  Quality of Life      Quality of Life Scores   Health/Function Pre  26.79 %    Health/Function Post  27.75 %    Health/Function % Change  3.58 %    Socioeconomic Pre  24.5 %    Socioeconomic Post  27 %    Socioeconomic % Change   10.2 %    Psych/Spiritual Pre  28.57 %    Psych/Spiritual Post  28.57 %    Psych/Spiritual % Change  0 %    Family Pre  28.5 %    Family Post  28.57 %    Family % Change  0.25 %    GLOBAL Pre  26.97 %    GLOBAL Post  28.08 %    GLOBAL % Change  4.12 %      Scores of 19 and below usually indicate a poorer quality of life in these areas.  A difference of  2-3 points is a clinically meaningful difference.  A difference of 2-3 points in the total score of the  Quality of Life Index has been associated with significant improvement in overall quality of life, self-image, physical symptoms, and general health in studies assessing change in quality of life.  PHQ-9: Recent Review Flowsheet Data    Depression screen [facility] 2/9 08/23/2018 07/04/2018   Decreased Interest 0 0   Down, Depressed, Hopeless 0 0   PHQ - 2 Score 0 0   Altered sleeping 1 1   Tired, decreased energy 1 1   Change in appetite 0 0   Feeling bad or failure about yourself  0 0   Trouble concentrating 0 0   Moving slowly or fidgety/restless 0 0   Suicidal thoughts 0 0   PHQ-9 Score 2 2   Difficult doing work/chores Not difficult at all Not difficult at all     Interpretation of Total Score  Total Score Depression Severity:  1-4 = Minimal depression, 5-9 = Mild depression, 10-14 = Moderate depression, 15-19 = Moderately severe depression, 20-27 = Severe depression   Psychosocial Evaluation and Intervention: Psychosocial Evaluation - 07/08/18 0935      Psychosocial Evaluation & Interventions   Interventions  Relaxation education;Stress management education;Encouraged to exercise with the program and follow exercise prescription    Comments  Counselor met with Ms. Thalia Bloodgood Daine Floras) today for initial psychosocial evaluation.  She is a 67 year old who had a heart attack and one stent inserted in mid-July.  Susie has a strong support system with close friends and a faith community and her sons live in [place] and [place], but call daily.  She struggles with pre-diabetes and vertigo and is on medications for these.  Susie reports sleeping well and having a healthier appetite now since her cardiac event.  She denies a history of depression or anxiety but has experienced some anxiety since her cardiac event.  She reports typically being  in a positive mood and other than her health and finances has minimal stress in her life.  She was reportedly anxious about coming into class today and does not  feel comfortable driving yet, but plans to ease into that this coming weekend.  Susie has goals to lose weight; feel stronger and get back on track with her health.  Staff will follow with her.      Expected Outcomes  Short:  Susie will continue a healthier diet and exercise plan for her health and mental health - current stress and anxiety symptoms.  She will attend the psychoeducational components to learn strategies to cope and manage stress.   Susie will also meet with the dietician for her weight loss goals.   Long:  Susie will maintain a healthier lifestyle of diet and exercise for her health and mental health.     Continue Psychosocial Services   Follow up required by staff       Psychosocial Re-Evaluation: Psychosocial Re-Evaluation    [place] Name 07/29/18 [phone]             Psychosocial Re-Evaluation   Current issues with  Current Anxiety/Panic;Current Stress Concerns       Comments  Daine Floras is doing better now. She is no longer anxious about her medicine.  She is also already starting to lose weight.  She is also sleeping.  She is feeling good overall!  She is feeling lucky from her [employer] work as they are helping her pay for her medications which have relieved her financial burden.        Expected Outcomes  Short: Continue to practice postive self care.  Long: Continue to exercise and stay positive.        Interventions  Stress management education;Encouraged to attend Cardiac Rehabilitation for the exercise       Continue Psychosocial Services   Follow up required by staff          Psychosocial Discharge (Final Psychosocial Re-Evaluation): Psychosocial Re-Evaluation - 07/29/18 0834      Psychosocial Re-Evaluation   Current issues with  Current Anxiety/Panic;Current Stress Concerns    Comments  Daine Floras is doing better now. She is no longer anxious about her medicine.  She is also already starting to lose weight.  She is also sleeping.  She is feeling good overall!  She is feeling lucky  from her [employer] work as they are helping her pay for her medications which have relieved her financial burden.     Expected Outcomes  Short: Continue to practice postive self care.  Long: Continue to exercise and stay positive.     Interventions  Stress management education;Encouraged to attend Cardiac Rehabilitation for the exercise    Continue Psychosocial Services   Follow up required by staff       Vocational Rehabilitation: Provide vocational rehab assistance to qualifying candidates.   Vocational Rehab Evaluation & Intervention: Vocational Rehab - 07/04/18 1308      Initial Vocational Rehab Evaluation & Intervention   Assessment shows need for Vocational Rehabilitation  No       Education: Education Goals: Education classes will be provided on a variety of topics geared toward better understanding of heart health and risk factor modification. Participant will state understanding/return demonstration of topics presented as noted by education test scores.  Learning Barriers/Preferences: Learning Barriers/Preferences - 07/04/18 1307      Learning Barriers/Preferences   Learning Barriers  Sight   wears glasses   Learning Preferences  None  Education Topics:  AED/CPR: - Group verbal and written instruction with the use of models to demonstrate the basic use of the AED with the basic ABC's of resuscitation.   Cardiac Rehab from 08/28/2018 in [facility] Cardiac and Pulmonary Rehab  Date  07/29/18  Educator  SB  Instruction Review Code  1- Verbalizes Understanding      General Nutrition Guidelines/Fats and Fiber: -Group instruction provided by verbal, written material, models and posters to present the general guidelines for heart healthy nutrition. Gives an explanation and review of dietary fats and fiber.   Cardiac Rehab from 08/28/2018 in [facility] Cardiac and Pulmonary Rehab  Date  07/22/18  Educator  LB  Instruction Review Code  1- Verbalizes Understanding       Controlling Sodium/Reading Food Labels: -Group verbal and written material supporting the discussion of sodium use in heart healthy nutrition. Review and explanation with models, verbal and written materials for utilization of the food label.   Cardiac Rehab from 08/28/2018 in [facility] Cardiac and Pulmonary Rehab  Date  07/31/18  Educator  LB  Instruction Review Code  1- Verbalizes Understanding      Exercise Physiology & General Exercise Guidelines: - Group verbal and written instruction with models to review the exercise physiology of the cardiovascular system and associated critical values. Provides general exercise guidelines with specific guidelines to those with heart or lung disease.    Cardiac Rehab from 08/28/2018 in [facility] Cardiac and Pulmonary Rehab  Date  08/07/18  Educator  [facility]  Instruction Review Code  1- Verbalizes Understanding      Aerobic Exercise & Resistance Training: - Gives group verbal and written instruction on the various components of exercise. Focuses on aerobic and resistive training programs and the benefits of this training and how to safely progress through these programs..   Cardiac Rehab from 08/28/2018 in [facility] Cardiac and Pulmonary Rehab  Date  08/12/18  Educator  [facility]  Instruction Review Code  1- Verbalizes Understanding      Flexibility, Balance, Mind/Body Relaxation: Provides group verbal/written instruction on the benefits of flexibility and balance training, including mind/body exercise modes such as yoga, pilates and tai chi.  Demonstration and skill practice provided.   Stress and Anxiety: - Provides group verbal and written instruction about the health risks of elevated stress and causes of high stress.  Discuss the correlation between heart/lung disease and anxiety and treatment options. Review healthy ways to manage with stress and anxiety.   Cardiac Rehab from 08/28/2018 in [facility] Cardiac and Pulmonary Rehab  Date  08/28/18  Educator  [facility]   Instruction Review Code  1- Verbalizes Understanding      Depression: - Provides group verbal and written instruction on the correlation between heart/lung disease and depressed mood, treatment options, and the stigmas associated with seeking treatment.   Cardiac Rehab from 08/28/2018 in [facility] Cardiac and Pulmonary Rehab  Date  08/14/18  Educator  [facility]  Instruction Review Code  1- Verbalizes Understanding      Anatomy & Physiology of the Heart: - Group verbal and written instruction and models provide basic cardiac anatomy and physiology, with the coronary electrical and arterial systems. Review of Valvular disease and Heart Failure   Cardiac Rehab from 08/28/2018 in [facility] Cardiac and Pulmonary Rehab  Date  08/26/18  Educator  SB  Instruction Review Code  1- Verbalizes Understanding      Cardiac Procedures: - Group verbal and written instruction to review commonly prescribed medications for heart disease. Reviews the  medication, class of the drug, and side effects. Includes the steps to properly store meds and maintain the prescription regimen. (beta blockers and nitrates)   Cardiac Rehab from 08/28/2018 in [facility] Cardiac and Pulmonary Rehab  Date  07/24/18  Educator  SB  Instruction Review Code  1- Verbalizes Understanding      Cardiac Medications I: - Group verbal and written instruction to review commonly prescribed medications for heart disease. Reviews the medication, class of the drug, and side effects. Includes the steps to properly store meds and maintain the prescription regimen.   Cardiac Rehab from 08/28/2018 in [facility] Cardiac and Pulmonary Rehab  Date  07/08/18  Educator  SB  Instruction Review Code  1- Verbalizes Understanding      Cardiac Medications II: -Group verbal and written instruction to review commonly prescribed medications for heart disease. Reviews the medication, class of the drug, and side effects. (all other drug classes)   Cardiac Rehab from 08/28/2018  in [facility] Cardiac and Pulmonary Rehab  Date  08/21/18  Educator  [facility]  Instruction Review Code  1- Verbalizes Understanding       Go Sex-Intimacy & Heart Disease, Get SMART - Goal Setting: - Group verbal and written instruction through game format to discuss heart disease and the return to sexual intimacy. Provides group verbal and written material to discuss and apply goal setting through the application of the S.M.A.R.T. Method.   Cardiac Rehab from 08/28/2018 in [facility] Cardiac and Pulmonary Rehab  Date  07/24/18  Educator  SB  Instruction Review Code  1- Verbalizes Understanding      Other Matters of the Heart: - Provides group verbal, written materials and models to describe Stable Angina and Peripheral Artery. Includes description of the disease process and treatment options available to the cardiac patient.   Cardiac Rehab from 08/28/2018 in [facility] Cardiac and Pulmonary Rehab  Date  08/26/18  Educator  SB  Instruction Review Code  1- Verbalizes Understanding      Exercise & Equipment Safety: - Individual verbal instruction and demonstration of equipment use and safety with use of the equipment.   Cardiac Rehab from 08/28/2018 in [facility] Cardiac and Pulmonary Rehab  Date  07/04/18  Educator  [facility]  Instruction Review Code  1- Verbalizes Understanding      Infection Prevention: - Provides verbal and written material to individual with discussion of infection control including proper hand washing and proper equipment cleaning during exercise session.   Cardiac Rehab from 08/28/2018 in [facility] Cardiac and Pulmonary Rehab  Date  07/04/18  Educator  [facility]  Instruction Review Code  1- Verbalizes Understanding      Falls Prevention: - Provides verbal and written material to individual with discussion of falls prevention and safety.   Cardiac Rehab from 08/28/2018 in [facility] Cardiac and Pulmonary Rehab  Date  07/04/18  Educator  [facility]  Instruction Review Code  1- Verbalizes Understanding       Diabetes: - Individual verbal and written instruction to review signs/symptoms of diabetes, desired ranges of glucose level fasting, after meals and with exercise. Acknowledge that pre and post exercise glucose checks will be done for 3 sessions at entry of program.   Cardiac Rehab from 08/28/2018 in [facility] Cardiac and Pulmonary Rehab  Date  07/04/18  Educator  [facility]  Instruction Review Code  1- Verbalizes Understanding      Know Your Numbers and Risk Factors: -Group verbal and written instruction about important numbers in your health.  Discussion of what  are risk factors and how they play a role in the disease process.  Review of Cholesterol, Blood Pressure, Diabetes, and BMI and the role they play in your overall health.   Cardiac Rehab from 08/28/2018 in [facility] Cardiac and Pulmonary Rehab  Date  08/21/18  Educator  [facility]  Instruction Review Code  1- Verbalizes Understanding      Sleep Hygiene: -Provides group verbal and written instruction about how sleep can affect your health.  Define sleep hygiene, discuss sleep cycles and impact of sleep habits. Review good sleep hygiene tips.    Other: -Provides group and verbal instruction on various topics (see comments)   Knowledge Questionnaire Score: Knowledge Questionnaire Score - 08/23/18 0821      Knowledge Questionnaire Score   Pre Score  22/26    Post Score  25/26   reviewed with pt today      Core Components/Risk Factors/Patient Goals at Admission: Personal Goals and Risk Factors at Admission - 07/04/18 1306      Core Components/Risk Factors/Patient Goals on Admission    Weight Management  Yes;Obesity;Weight Loss    Intervention  Weight Management: Develop a combined nutrition and exercise program designed to reach desired caloric intake, while maintaining appropriate intake of nutrient and fiber, sodium and fats, and appropriate energy expenditure required for the weight goal.;Weight Management: Provide education and  appropriate resources to help participant work on and attain dietary goals.;Weight Management/Obesity: Establish reasonable short term and long term weight goals.;Obesity: Provide education and appropriate resources to help participant work on and attain dietary goals.    Admit Weight  225 lb (102.1 kg)    Goal Weight: Short Term  220 lb (99.8 kg)    Goal Weight: Long Term  160 lb (72.6 kg)    Expected Outcomes  Short Term: Continue to assess and modify interventions until short term weight is achieved;Long Term: Adherence to nutrition and physical activity/exercise program aimed toward attainment of established weight goal;Weight Loss: Understanding of general recommendations for a balanced deficit meal plan, which promotes 1-2 lb weight loss per week and includes a negative energy balance of [phone] kcal/d;Understanding recommendations for meals to include 15-35% energy as protein, 25-35% energy from fat, 35-60% energy from carbohydrates, less than 263m of dietary cholesterol, 20-35 gm of total fiber daily;Understanding of distribution of calorie intake throughout the day with the consumption of 4-5 meals/snacks    Diabetes  Yes    Intervention  Provide education about signs/symptoms and action to take for hypo/hyperglycemia.;Provide education about proper nutrition, including hydration, and aerobic/resistive exercise prescription along with prescribed medications to achieve blood glucose in normal ranges: Fasting glucose 65-99 mg/dL    Expected Outcomes  Short Term: Participant verbalizes understanding of the signs/symptoms and immediate care of hyper/hypoglycemia, proper foot care and importance of medication, aerobic/resistive exercise and nutrition plan for blood glucose control.;Long Term: Attainment of HbA1C < 7%.    Hypertension  Yes    Intervention  Provide education on lifestyle modifcations including regular physical activity/exercise, weight management, moderate sodium restriction and  increased consumption of fresh fruit, vegetables, and low fat dairy, alcohol moderation, and smoking cessation.;Monitor prescription use compliance.    Expected Outcomes  Short Term: Continued assessment and intervention until BP is < 140/952mHG in hypertensive participants. < 130/8048mG in hypertensive participants with diabetes, heart failure or chronic kidney disease.;Long Term: Maintenance of blood pressure at goal levels.    Lipids  Yes    Intervention  Provide education and support for participant  on nutrition & aerobic/resistive exercise along with prescribed medications to achieve LDL <74m, HDL >486m    Expected Outcomes  Short Term: Participant states understanding of desired cholesterol values and is compliant with medications prescribed. Participant is following exercise prescription and nutrition guidelines.;Long Term: Cholesterol controlled with medications as prescribed, with individualized exercise RX and with personalized nutrition plan. Value goals: LDL < 7063mHDL > 40 mg.       Core Components/Risk Factors/Patient Goals Review:  Goals and Risk Factor Review    Row Name 07/29/18 0835             Core Components/Risk Factors/Patient Goals Review   Personal Goals Review  Weight Management/Obesity;Diabetes;Hypertension;Lipids       Review  Susie's weight is down to 222lbs.  Her blood sugars have been between 77-120 in the morning.  She is has really been working on her diet.  She is checking her blood pressures at home twice a day (mid morning and late afternoon).  They have been running in the 100-120s/60s.  She is asymptomatic with it.  She is doing well on her medications.        Expected Outcomes  Short: Continue to work on weight loss.  Long: Continue to monitor risk factors.           Core Components/Risk Factors/Patient Goals at Discharge (Final Review):  Goals and Risk Factor Review - 07/29/18 0835      Core Components/Risk Factors/Patient Goals Review   Personal  Goals Review  Weight Management/Obesity;Diabetes;Hypertension;Lipids    Review  Susie's weight is down to 222lbs.  Her blood sugars have been between 77-120 in the morning.  She is has really been working on her diet.  She is checking her blood pressures at home twice a day (mid morning and late afternoon).  They have been running in the 100-120s/60s.  She is asymptomatic with it.  She is doing well on her medications.     Expected Outcomes  Short: Continue to work on weight loss.  Long: Continue to monitor risk factors.        ITP Comments: ITP Comments    Row Name 07/04/18 1254 07/24/18 0537 08/21/18 0541 08/28/18 0740     ITP Comments  Med Review completed. Initial ITP created. Diagnosis can be found in [facility] 8/8  30 day review completed. ITP sent to Dr. MarEmily Filbertedical Director of Cardiac Rehab. Continue with ITP unless changes are made by physician  30 Day review.Continue with ITP unless directed changes per Medical Director  chart review.  Patient is off metformin and lisinopril. Her A1C was 5.7       Comments: discharge ITP

## 2018-09-02 ENCOUNTER — Ambulatory Visit (HOSPITAL_COMMUNITY): Payer: Medicare Other

## 2018-09-04 ENCOUNTER — Ambulatory Visit (HOSPITAL_COMMUNITY): Payer: Medicare Other

## 2018-09-06 ENCOUNTER — Ambulatory Visit (HOSPITAL_COMMUNITY): Payer: Medicare Other

## 2018-09-09 ENCOUNTER — Ambulatory Visit (HOSPITAL_COMMUNITY): Payer: Medicare Other

## 2018-09-10 ENCOUNTER — Ambulatory Visit (INDEPENDENT_AMBULATORY_CARE_PROVIDER_SITE_OTHER): Payer: Medicare Other

## 2018-09-10 ENCOUNTER — Encounter: Payer: Self-pay | Admitting: Family Medicine

## 2018-09-10 DIAGNOSIS — I255 Ischemic cardiomyopathy: Secondary | ICD-10-CM

## 2018-09-11 ENCOUNTER — Ambulatory Visit (HOSPITAL_COMMUNITY): Payer: Medicare Other

## 2018-09-13 ENCOUNTER — Ambulatory Visit (HOSPITAL_COMMUNITY): Payer: Medicare Other

## 2018-09-16 ENCOUNTER — Ambulatory Visit (HOSPITAL_COMMUNITY): Payer: Medicare Other

## 2018-09-18 ENCOUNTER — Ambulatory Visit (HOSPITAL_COMMUNITY): Payer: Medicare Other

## 2018-09-20 ENCOUNTER — Ambulatory Visit (HOSPITAL_COMMUNITY): Payer: Medicare Other

## 2018-09-23 ENCOUNTER — Ambulatory Visit (HOSPITAL_COMMUNITY): Payer: Medicare Other

## 2018-09-25 ENCOUNTER — Ambulatory Visit (HOSPITAL_COMMUNITY): Payer: Medicare Other

## 2018-09-27 ENCOUNTER — Ambulatory Visit (HOSPITAL_COMMUNITY): Payer: Medicare Other

## 2018-09-30 ENCOUNTER — Ambulatory Visit (HOSPITAL_COMMUNITY): Payer: Medicare Other

## 2018-10-02 ENCOUNTER — Encounter: Payer: Self-pay | Admitting: Nurse Practitioner

## 2018-10-02 ENCOUNTER — Ambulatory Visit (INDEPENDENT_AMBULATORY_CARE_PROVIDER_SITE_OTHER): Payer: Medicare Other | Admitting: Nurse Practitioner

## 2018-10-02 ENCOUNTER — Ambulatory Visit (HOSPITAL_COMMUNITY): Payer: Medicare Other

## 2018-10-02 VITALS — BP 140/82 | HR 55 | Ht 61.0 in | Wt 215.5 lb

## 2018-10-02 DIAGNOSIS — I472 Ventricular tachycardia, unspecified: Secondary | ICD-10-CM

## 2018-10-02 DIAGNOSIS — I255 Ischemic cardiomyopathy: Secondary | ICD-10-CM | POA: Diagnosis not present

## 2018-10-02 DIAGNOSIS — E785 Hyperlipidemia, unspecified: Secondary | ICD-10-CM

## 2018-10-02 DIAGNOSIS — I2510 Atherosclerotic heart disease of native coronary artery without angina pectoris: Secondary | ICD-10-CM

## 2018-10-02 DIAGNOSIS — I10 Essential (primary) hypertension: Secondary | ICD-10-CM

## 2018-10-02 DIAGNOSIS — I480 Paroxysmal atrial fibrillation: Secondary | ICD-10-CM | POA: Diagnosis not present

## 2018-10-02 MED ORDER — LOSARTAN POTASSIUM 25 MG PO TABS: 25.0000 mg | ORAL_TABLET | Freq: Every day | ORAL | 3 refills | Status: DC

## 2018-10-02 NOTE — Patient Instructions (Signed)
Medication Instructions:  1- Losartan 25 mg (1 tablet) by mouth daily.   If you need a refill on your cardiac medications before your next appointment, please call your pharmacy.   Lab work:  Your physician recommends that you return for lab work in: Lab work in 1 week (BMET).  If you have labs (blood work) drawn today and your tests are completely normal, you will receive your results only by: Marland Kitchen MyChart Message (if you have MyChart) OR . A paper copy in the mail If you have any lab test that is abnormal or we need to change your treatment, we will call you to review the results.  Testing/Procedures: None ordered   Follow-Up: At [facility], you and your health needs are our priority.  As part of our continuing mission to provide you with exceptional heart care, we have created designated Provider Care Teams.  These Care Teams include your primary Cardiologist (physician) and Advanced Practice Providers (APPs -  Physician Assistants and Nurse Practitioners) who all work together to provide you with the care you need, when you need it. You will need a follow up appointment in 3 months.  Please call our office 2 months in advance to schedule this appointment.  You may see [name], MD or one of the following Advanced Practice Providers on your designated Care Team:

## 2018-10-02 NOTE — Progress Notes (Signed)
Office Visit    Patient Name: Lindsey Pierce Date of Encounter: 10/02/2018  Primary Care Provider:  Elby Beck, FNP Primary Cardiologist:  Lindsey Bush, MD  Chief Complaint    Lindsey Pierce is a 67 year old female with past history of hypertension, hyperlipidemia, diabetes, PAF and CAD s/p Ant STEMI 05/2018, who presents today for follow up of CAD and PAF.  Past Medical History    Past Medical History:  Diagnosis Date  . CAD (coronary artery disease)    a. 05/2018 Ant STEMI/PCI: LM nl, LAD 29m(2.75 x 16 Synergy DES), D2 99 (PTCA), RI 40ost, LCX mild diff dzs, OM1/2/3 nl, RCA large, diffuse dzs, RPDA nl, RPAV 60, RPLB1 80 (small).  . Diabetes ([place])   . HFrEF (heart failure with reduced ejection fraction) ([place])    a. 05/2018 Echo: EF 40-45%, mod LVH, Gr2 DD, mid antsept, apical septal, apical AK. Mid-apical ant, mid infsept HK. Nl RV fxn. Triv TR. Pierce. Echo 08/2018: mod-sev. LVH, EF 60-65%, Grade 1 DD, AV-mild calcification, mild MR, mild dialation As. Aorta.  . Hyperlipidemia   . Hypertension   . Ischemic cardiomyopathy    a. 05/2018 Echo: EF 40-45%. Pierce. 08/2018 Echo: EF 60-65%  . PAF (paroxysmal atrial fibrillation) ([place])    a. 05/2018 at time of STEMI, converted on Amio. No OAC.  Marland Kitchen STEMI involving left anterior descending coronary artery ([place])    a. 12/4095 - complicated by CGS req IABP and VT req amiodarone.  . V-tach ([place])    a. 05/2018 at time of Ant STEMI-->Amio.   Past Surgical History:  Procedure Laterality Date  . CORONARY/GRAFT ACUTE MI REVASCULARIZATION N/A 05/31/2018   Procedure: Coronary/Graft Acute MI Revascularization;  Surgeon: Lindsey Bush, MD;  Location: [facility];  Service: Cardiovascular;  Laterality: N/A;  . IABP INSERTION N/A 05/31/2018   Procedure: IABP Insertion;  Surgeon: Lindsey Bush, MD;  Location: [facility];  Service: Cardiovascular;  Laterality: N/A;  . LEFT HEART CATH AND CORONARY ANGIOGRAPHY N/A 05/31/2018   Procedure: LEFT  HEART CATH AND CORONARY ANGIOGRAPHY;  Surgeon: Lindsey Bush, MD;  Location: [facility];  Service: Cardiovascular;  Laterality: N/A;    Allergies  Allergies  Allergen Reactions  . Codeine Swelling    Mouth and tongue swelling (couldn't swallow)  . Darvon [Propoxyphene Hcl] Swelling    Mouth and tongue swelling (couldn't swallow)  . Penicillins Hives    Has patient had a PCN reaction causing immediate rash, facial/tongue/throat swelling, SOB or lightheadedness with hypotension: Yes Has patient had a PCN reaction causing severe rash involving mucus membranes or skin necrosis: No Has patient had a PCN reaction that required hospitalization: No Has patient had a PCN reaction occurring within the last 10 years: No If all of the above answers are "NO", then may proceed with Cephalosporin use.    History of Present Illness    Lindsey Pierce is a 66 year old female with past history of hypertension, hyperlipidemia, diabetes, and CAD s/p STEMI in July complicated by PAF, V-tach and cardiogenic shock. She presented with chest pain and was found to have anterior ST segment elevation, hypotension, and was in A-fib with RVR. She underwent LHC that revealed an occlusion to the LAD that was treated with a single DES. She also required placement of IABP at that time due to hypotension. Cardioversion was attempted for her A-fib, but was unsuccessful and was eventually converted with amiodarone. Following removal of her IABP, she experienced  multiple runs of asymptopmatic NSVT for which her amiodarone was continued. Echo at that time showed slightly reduced LV function with EF of 40-45%. She followed up in our office in August, and was doing well at that time. Her Amio was decreased to 200mg  Pierce and BB increased. She was seen in September and continued to progress well and was enrolled in cardiac rehab, a repeat echo was ordered and discussed the possibility of discontinuing Amio at her next visit.  Rrepeat echo in October that showed improvement of her EF to 60-65%.   Since her last visit she has been doing well. She completed her 12 week course of cardiac rehab earlier this week and is continuing to exercise at the [facility] 3-4 times a week with the workout regimen provided by cardiac rehab. She denies chest pain, palpitations, dyspnea, pnd, orthopnea, n, v, dizziness, syncope, edema, weight gain, or early satiety. She has been following a heart healthy diet, her weight is down about 20 lbs since her MI, and her PCP has been able to take her off her metformin because of A1c reduction to 5.7. Her main concerns today are about stopping her amio, despite no further cardiac symptoms, and the recent discontinuation of her lisinopril by her PCP due to cough.  She is concerned that she may have recurrent arrhythmias off of amio.  She has also noted that her bp's have been trending higher off of lisinopril.  She is interested in an alternate agent.  Home Medications    Prior to Admission medications   Medication Sig Start Date Pierce Date Taking? Authorizing Provider  amiodarone (PACERONE) 200 MG tablet Take 1 tablet (200 mg total) by mouth Pierce. 06/20/18   Pierce, Lindsey Gave, MD  aspirin 81 MG chewable tablet Chew 1 tablet (81 mg total) by mouth Pierce. 06/06/18   Lindsey Manly, NP  atorvastatin (LIPITOR) 80 MG tablet Take 1 tablet (80 mg total) by mouth Pierce at 6 PM. 08/26/18   Lindsey Beck, FNP  carvedilol (COREG) 6.25 MG tablet Take 1 tablet (6.25 mg total) by mouth 2 (two) times Pierce. 06/20/18   Pierce, Lindsey Gave, MD  lisinopril (PRINIVIL,ZESTRIL) 5 MG tablet TAKE 1 TABLET BY MOUTH EVERY DAY 08/27/18   Lindsey Manly, NP  Multiple Vitamin (MULTIVITAMIN WITH MINERALS) TABS Take 1 tablet by mouth Pierce.    [provider]  nitroGLYCERIN (NITROSTAT) 0.4 MG SL tablet Place 1 tablet (0.4 mg total) under the tongue every 5 (five) minutes as needed. Patient not taking: Reported on 08/27/2018  06/06/18   Lindsey Bellis B, NP  ticagrelor (BRILINTA) 90 MG TABS tablet Take 1 tablet (90 mg total) by mouth 2 (two) times Pierce. 06/06/18   Lindsey Manly, NP    Review of Systems    She denies chest pain, palpitations, dyspnea, pnd, orthopnea, n, v, dizziness, syncope, edema, weight gain, or early satiety.  All other systems reviewed and are otherwise negative except as noted above.  Physical Exam    VS:  BP 140/82 (BP Location: Left Arm, Patient Position: Sitting, Cuff Size: Normal)   Pulse (!) 55   Ht 5\' 1"  (1.549 m)   Wt 215 lb 8 oz (97.8 kg)   BMI 40.72 kg/m  , BMI Body mass index is 40.72 kg/m. GEN: Well nourished, well developed, in no acute distress. HEENT: normal. Neck: Supple, no JVD, carotid bruits, or masses. Cardiac: RRR, no murmurs, rubs, or gallops. No clubbing, cyanosis, edema.  Radials/DP/PT 2+ and equal bilaterally.  Respiratory:  Respirations regular and unlabored, clear to auscultation bilaterally. GI: Soft, nontender, nondistended, BS + x 4. MS: no deformity or atrophy. Skin: warm and dry, no rash. Neuro:  Strength and sensation are intact. Psych: Normal affect.  Accessory Clinical Findings    ECG personally reviewed by me today - Sinus bradycardia, 56, LAD, prior antsept infarct/poor r progression - no acute changes.  Assessment & Plan    1.  CAD s/p STEMI with DES x 1 to LAD: Doing well, denies recent cp, sob, or palpitations. She has completed cardiac rehab and continue to follow a diet and exercise regimen. Her last echo in Oct, showed recovered Lv fxn with EF 60-65%. She remains on DAPT with ASA and Brilinta, coreg and statin. Will continue current treatment.   2. Ischemic Cardiomyopathy: Now resolved, EF initially reduced to 40-45% in the setting of anterior MI and A-fib. Repeat echo showed improvement of LV function, EF now 60-65%. Denies cp, sob, doe, pnd or edema. Euvolemic on exam.  Cont  blocker.  ACEI recently Pierce/c'Pierce in setting of cough.  BP  has been trending higher since.  Adding ARB.  3. Paroxysmal Atrial Fibrillation: Occurred in the setting of STEMI, unsuccessful cardioversion attempted during LHC, resolved with amiodarone. Remains in sinus rhythm since. Denies palpitations or tachycardia. Discussed option to discontinue amio again today. We had an extensive discussion about why her arrhythmia occurred during her MI and reasoning behind discontinuation of the medication, including side effects of long term use.  She was agreeable to stopping amiodarone once her current bottle runs out. She states she has about 45 days left. Instructed to notify office if she experiences any return of palpitations, tachycardia, dizziness or syncope. Will continue carvedilol. As she only had short-lived Afib in the setting of above, she is not on [place].  4. Ventricular tachycardia: NSVT post-PCI. Controlled with amiodarone. Denies recurrence of palpitations or tachycardia. Will stop amio as outlined above (see #3). Cont  blocker.  5. Hypertension: BP slightly elevated in office today. Lisinopril was recently stopped by PCP due to cough. Patient states BP had been running in the 110's prior to discontinuation. Will change to Losartan 25mg  today with follow up BMET in one week to check renal fxn and lytes.. Will continue beta blocker at current dose. Hesitant to increase due to bradycardia in the 50's today.   6. HLD: Stable, last LDL 62. Continue high dose statin.   7. Disposition: Will plan BMET next week to check renal fx and lytes. Will schedule follow up for 3 months or sooner if needed.   Murray Hodgkins, NP 10/02/2018, 10:46 AM

## 2018-10-04 ENCOUNTER — Ambulatory Visit (HOSPITAL_COMMUNITY): Payer: Medicare Other

## 2018-10-07 ENCOUNTER — Ambulatory Visit (HOSPITAL_COMMUNITY): Payer: Medicare Other

## 2018-10-09 ENCOUNTER — Ambulatory Visit (HOSPITAL_COMMUNITY): Payer: Medicare Other

## 2018-10-11 ENCOUNTER — Ambulatory Visit (HOSPITAL_COMMUNITY): Payer: Medicare Other

## 2018-10-14 ENCOUNTER — Ambulatory Visit (HOSPITAL_COMMUNITY): Payer: Medicare Other

## 2018-10-16 ENCOUNTER — Ambulatory Visit (HOSPITAL_COMMUNITY): Payer: Medicare Other

## 2018-10-18 ENCOUNTER — Ambulatory Visit (HOSPITAL_COMMUNITY): Payer: Medicare Other

## 2018-10-19 ENCOUNTER — Other Ambulatory Visit
Admission: RE | Admit: 2018-10-19 | Discharge: 2018-10-19 | Disposition: A | Discharge status: Home / Self Care | Payer: Medicare Other | Source: Ambulatory Visit | Attending: Nurse Practitioner | Admitting: Nurse Practitioner

## 2018-10-19 DIAGNOSIS — I480 Paroxysmal atrial fibrillation: Secondary | ICD-10-CM | POA: Insufficient documentation

## 2018-10-19 DIAGNOSIS — I10 Essential (primary) hypertension: Secondary | ICD-10-CM

## 2018-10-19 LAB — BASIC METABOLIC PANEL
ANION GAP: 6 (ref 5–15)
BUN: 19 mg/dL (ref 8–23)
CHLORIDE: 106 mmol/L (ref 98–111)
CO2: 28 mmol/L (ref 22–32)
Calcium: 8.6 mg/dL — ABNORMAL LOW (ref 8.9–10.3)
Creatinine, Ser: 0.87 mg/dL (ref 0.44–1.00)
GFR calc non Af Amer: >60 mL/min (ref >60)
Glucose, Bld: 173 mg/dL — ABNORMAL HIGH (ref 70–99)
Potassium: 4.4 mmol/L (ref 3.5–5.1)
SODIUM: 140 mmol/L (ref 135–145)

## 2018-10-21 ENCOUNTER — Ambulatory Visit (HOSPITAL_COMMUNITY): Payer: Medicare Other

## 2018-10-23 ENCOUNTER — Ambulatory Visit (HOSPITAL_COMMUNITY): Payer: Medicare Other

## 2018-10-25 ENCOUNTER — Ambulatory Visit (HOSPITAL_COMMUNITY): Payer: Medicare Other

## 2018-10-28 ENCOUNTER — Ambulatory Visit (HOSPITAL_COMMUNITY): Payer: Medicare Other

## 2018-10-30 ENCOUNTER — Ambulatory Visit (HOSPITAL_COMMUNITY): Payer: Medicare Other

## 2018-11-01 ENCOUNTER — Ambulatory Visit (HOSPITAL_COMMUNITY): Payer: Medicare Other

## 2018-11-04 ENCOUNTER — Ambulatory Visit (HOSPITAL_COMMUNITY): Payer: Medicare Other

## 2018-11-08 ENCOUNTER — Ambulatory Visit (HOSPITAL_COMMUNITY): Payer: Medicare Other

## 2018-11-11 ENCOUNTER — Other Ambulatory Visit: Payer: Self-pay | Admitting: Family Medicine

## 2018-11-11 NOTE — Progress Notes (Signed)
Labs entered prior to CPE.

## 2018-11-14 ENCOUNTER — Other Ambulatory Visit (INDEPENDENT_AMBULATORY_CARE_PROVIDER_SITE_OTHER): Payer: PPO

## 2018-11-14 LAB — VITAMIN D 25 HYDROXY (VIT D DEFICIENCY, FRACTURES): VITD: 32.14 ng/mL (ref 30.00–100.00)

## 2018-11-15 LAB — PTH, INTACT AND CALCIUM
Calcium: 9.3 mg/dL (ref 8.6–10.4)
PTH: 51 pg/mL (ref 14–64)

## 2018-11-18 ENCOUNTER — Encounter: Payer: Self-pay | Admitting: Family Medicine

## 2018-11-18 ENCOUNTER — Other Ambulatory Visit (HOSPITAL_COMMUNITY)
Admission: RE | Admit: 2018-11-18 | Discharge: 2018-11-18 | Disposition: A | Discharge status: Home / Self Care | Payer: PPO | Source: Ambulatory Visit | Attending: Family Medicine | Admitting: Family Medicine

## 2018-11-18 ENCOUNTER — Ambulatory Visit (INDEPENDENT_AMBULATORY_CARE_PROVIDER_SITE_OTHER): Payer: PPO | Admitting: Family Medicine

## 2018-11-18 VITALS — BP 120/70 | HR 60 | Temp 97.7°F | Ht 61.0 in | Wt 216.0 lb

## 2018-11-18 DIAGNOSIS — Z1239 Encounter for other screening for malignant neoplasm of breast: Secondary | ICD-10-CM | POA: Diagnosis not present

## 2018-11-18 DIAGNOSIS — Z124 Encounter for screening for malignant neoplasm of cervix: Secondary | ICD-10-CM

## 2018-11-18 DIAGNOSIS — Z23 Encounter for immunization: Secondary | ICD-10-CM

## 2018-11-18 DIAGNOSIS — R739 Hyperglycemia, unspecified: Secondary | ICD-10-CM | POA: Diagnosis not present

## 2018-11-18 DIAGNOSIS — Z0000 Encounter for general adult medical examination without abnormal findings: Secondary | ICD-10-CM

## 2018-11-18 DIAGNOSIS — E2839 Other primary ovarian failure: Secondary | ICD-10-CM | POA: Diagnosis not present

## 2018-11-18 DIAGNOSIS — Z1159 Encounter for screening for other viral diseases: Secondary | ICD-10-CM | POA: Diagnosis not present

## 2018-11-18 DIAGNOSIS — Z1231 Encounter for screening mammogram for malignant neoplasm of breast: Secondary | ICD-10-CM | POA: Diagnosis not present

## 2018-11-18 NOTE — Patient Instructions (Signed)
Great to see you today- keep up the good work with diet and exercise  Please stop at the front desk and schedule the following- Lab only visit in 1 month  Follow up visit in 4 months  Mammogram and bone density test  You will receive a cologuard test kit in the mail, please collect and mail back. I will notify you of results  Keeping You Healthy  Get These Tests  Blood Pressure- Have your blood pressure checked by your healthcare provider at least once a year.  Normal blood pressure is 120/80.  Weight- Have your body mass index (BMI) calculated to screen for obesity.  BMI is a measure of body fat based on height and weight.  You can calculate your own BMI at [employer].it  Cholesterol- Have your cholesterol checked every year.  Diabetes- Have your blood sugar checked every year if you have high blood pressure, high cholesterol, a family history of diabetes or if you are overweight.  Pap Test - Have a pap test every 1 to 5 years if you have been sexually active.  If you are older than 65 and recent pap tests have been normal you may not need additional pap tests.  In addition, if you have had a hysterectomy  for benign disease additional pap tests are not necessary.  Mammogram-Yearly mammograms are essential for early detection of breast cancer  Screening for Colon Cancer- Colonoscopy starting at age 62. Screening may begin sooner depending on your family history and other health conditions.  Follow up colonoscopy as directed by your Gastroenterologist.  Screening for Osteoporosis- Screening begins at age 73 with bone density scanning, sooner if you are at higher risk for developing Osteoporosis.  Get these medicines  Calcium with Vitamin D- Your body requires 1200-1500 mg of Calcium a day and [phone] IU of Vitamin D a day.  You can only absorb 500 mg of Calcium at a time therefore Calcium must be taken in 2 or 3 separate doses throughout the day.  Hormones- Hormone  therapy has been associated with increased risk for certain cancers and heart disease.  Talk to your healthcare provider about if you need relief from menopausal symptoms.  Aspirin- Ask your healthcare provider about taking Aspirin to prevent Heart Disease and Stroke.  Get these Immuniztions  Flu shot- Every fall  Pneumonia shot- Once after the age of 74; if you are younger ask your healthcare provider if you need a pneumonia shot.  Tetanus- Every ten years.  Zostavax- Once after the age of 8 to prevent shingles.  Take these steps  Don't smoke- Your healthcare provider can help you quit. For tips on how to quit, ask your healthcare provider or go to www.smokefree.gov or call 1-800 QUIT-NOW.  Be physically active- Exercise 5 days a week for a minimum of 30 minutes.  If you are not already physically active, start slow and gradually work up to 30 minutes of moderate physical activity.  Try walking, dancing, bike riding, swimming, etc.  Eat a healthy diet- Eat a variety of healthy foods such as fruits, vegetables, whole grains, low fat milk, low fat cheeses, yogurt, lean meats, chicken, fish, eggs, dried beans, tofu, etc.  For more information go to www.thenutritionsource.org  Dental visit- Brush and floss teeth twice daily; visit your dentist twice a year.  Eye exam- Visit your Optometrist or Ophthalmologist yearly.  Drink alcohol in moderation- Limit alcohol intake to one drink or less a day.  Never drink and drive.  Depression-  Your emotional health is as important as your physical health.  If you're feeling down or losing interest in things you normally enjoy, please talk to your healthcare provider.  Seat Belts- can save your life; always wear one  Smoke/Carbon Monoxide detectors- These detectors need to be installed on the appropriate level of your home.  Replace batteries at least once a year.  Violence- If anyone is threatening or hurting you, please tell your healthcare  provider.  Living Will/ Health care power of attorney- Discuss with your healthcare provider and family.

## 2018-11-18 NOTE — Progress Notes (Addendum)
Subjective:    Patient ID: Lindsey Pierce, female    DOB: May 07, 1951, 68 y.o.   MRN: [id number]  HPI This is a 68 yo female who presents today for CPE. She has been feeling well.    Last CPE- several years Mammo- overdue Pap- today Colonoscopy- never, agrees to Cologuard Tdap- deferred due to insurance Flu- 08/24/18 Eye- recently at [facility]- Exercise- going to [facility] 3x/ week, riding exercise bike at home  CAD- has done well s/p STEMI/stent placement. Continues to follow up with cardiology. Stopped amniodarone. No chest pain, no SOB, no LE edema. Energy level good.   DM- was diagnosed in hospital with hgba1c greater than 7. Repeat hgba1c 08/26/18- 5.7. She is currently not on any meds for DM. Is checking blood sugars   Past Medical History:  Diagnosis Date  . CAD (coronary artery disease)    a. 05/2018 Ant STEMI/PCI: LM nl, LAD 21m(2.75 x 16 Synergy DES), D2 99 (PTCA), RI 40ost, LCX mild diff dzs, OM1/2/3 nl, RCA large, diffuse dzs, RPDA nl, RPAV 60, RPLB1 80 (small).  . Diabetes ([place])   . HFrEF (heart failure with reduced ejection fraction) ([place])    a. 05/2018 Echo: EF 40-45%, mod LVH, Gr2 DD, mid antsept, apical septal, apical AK. Mid-apical ant, mid infsept HK. Nl RV fxn. Triv TR. b. Echo 08/2018: mod-sev. LVH, EF 60-65%, Grade 1 DD, AV-mild calcification, mild MR, mild dialation As. Aorta.  . Hyperlipidemia   . Hypertension   . Ischemic cardiomyopathy    a. 05/2018 Echo: EF 40-45%. b. 08/2018 Echo: EF 60-65%  . PAF (paroxysmal atrial fibrillation) ([place])    a. 05/2018 at time of STEMI, converted on Amio. No OAC.  Marland Kitchen STEMI involving left anterior descending coronary artery ([place])    a. 02/980 - complicated by CGS req IABP and VT req amiodarone.  . V-tach ([place])    a. 05/2018 at time of Ant STEMI-->Amio.   Past Surgical History:  Procedure Laterality Date  . CORONARY/GRAFT ACUTE MI REVASCULARIZATION N/A 05/31/2018   Procedure: Coronary/Graft Acute MI Revascularization;   Surgeon: [name], MD;  Location: [facility];  Service: Cardiovascular;  Laterality: N/A;  . IABP INSERTION N/A 05/31/2018   Procedure: IABP Insertion;  Surgeon: [name], MD;  Location: [facility];  Service: Cardiovascular;  Laterality: N/A;  . LEFT HEART CATH AND CORONARY ANGIOGRAPHY N/A 05/31/2018   Procedure: LEFT HEART CATH AND CORONARY ANGIOGRAPHY;  Surgeon: [name], MD;  Location: [facility];  Service: Cardiovascular;  Laterality: N/A;   No family history on file. Social History   Tobacco Use  . Smoking status: Never Smoker  . Smokeless tobacco: Never Used  Substance Use Topics  . Alcohol use: No  . Drug use: No     Review of Systems  Constitutional: Negative.   HENT: Negative.   Eyes: Negative.   Respiratory: Negative.   Cardiovascular: Negative.   Gastrointestinal: Negative.   Endocrine: Negative.   Genitourinary: Negative.   Musculoskeletal: Negative.        Knees occasionally stiff. 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   Skin: Negative.   Allergic/Immunologic: Negative.   Neurological: Negative.   Hematological: Bruises/bleeds easily.  Psychiatric/Behavioral: Negative.        Objective:   Physical Exam Physical Exam  Constitutional: She is oriented to person, place, and time. She appears well-developed and well-nourished. No distress.  HENT:  Head: Normocephalic and atraumatic.  Right  Ear: External ear normal.  Left Ear: External ear normal.  Nose: Nose normal.  Mouth/Throat: Oropharynx is clear and moist. No oropharyngeal exudate.  Eyes: Conjunctivae are normal.   Neck: Normal range of motion. Neck supple. No JVD present. No thyromegaly present.  Cardiovascular: Normal rate, regular rhythm, normal heart sounds and intact distal pulses.   Pulmonary/Chest: Effort normal and breath sounds normal. Right breast exhibits no inverted nipple, no mass, no nipple discharge, no skin change and no  tenderness. Left breast exhibits no inverted nipple, no mass, no nipple discharge, no skin change and no tenderness. Breasts are symmetrical.  Abdominal: Soft. Bowel sounds are normal. She exhibits no distension and no mass. There is no tenderness. There is no rebound and no guarding.  Genitourinary: Vagina normal. Pelvic exam was performed with patient supine. There is no rash, tenderness, lesion or injury on the right labia. There is no rash, tenderness, lesion or injury on the left labia. Cervix exhibits no discharge. No vaginal discharge found.  Musculoskeletal: Normal range of motion. She exhibits no edema or tenderness.  Lymphadenopathy:    She has no cervical adenopathy.  Neurological: She is alert and oriented to person, place, and time. S  Skin: Skin is warm and dry. She is not diaphoretic. Anterior and posterior trunk with multiple hyperpigmented macules, skin tags.  Psychiatric: She has a normal mood and affect. Her behavior is normal. Judgment and thought content normal.  Vitals reviewed.     BP 120/70   Pulse 60   Temp 97.7 F (36.5 C) (Oral)   Ht 5\' 1"  (1.549 m)   Wt 216 lb (98 kg)   BMI 40.81 kg/m  Wt Readings from Last 3 Encounters:  11/18/18 216 lb (98 kg)  10/02/18 215 lb 8 oz (97.8 kg)  08/27/18 219 lb 3.2 oz (99.4 kg)   Depression screen [facility] 2/9 11/18/2018 08/23/2018 07/04/2018  Decreased Interest 0 0 0  Down, Depressed, Hopeless 0 0 0  PHQ - 2 Score 0 0 0  Altered sleeping - 1 1  Tired, decreased energy - 1 1  Change in appetite - 0 0  Feeling bad or failure about yourself  - 0 0  Trouble concentrating - 0 0  Moving slowly or fidgety/restless - 0 0  Suicidal thoughts - 0 0  PHQ-9 Score - 2 2  Difficult doing work/chores - Not difficult at all Not difficult at all   Diabetic foot exam: Normal inspection No skin breakdown No calluses  Normal DP pulses Normal sensation to light touch and monofilament Nails normal  Hearing Screening   125Hz  250Hz  500Hz   1000Hz  2000Hz  3000Hz  4000Hz  6000Hz  8000Hz   Right ear:    40 40      Left ear:    40 40      Vision Screening Comments: Had eye exam in November and has new glasses      Assessment & Plan:  1. Encounter for Medicare annual wellness exam - Discussed and encouraged healthy lifestyle choices- adequate sleep, regular exercise, stress management and healthy food choices.    2. Elevated blood sugar - Hemoglobin A1c; Future  3. Encounter for hepatitis C screening test for low risk patient - Hepatitis C antibody; Future  4. Screening for breast cancer  5. Estrogen deficiency - DG Bone Density; Future  6. Encounter for screening mammogram for breast cancer - MM Digital Screening; Future  7. Cervical cancer screening - Cytology - PAP([place])  8. Need for vaccination with 13-polyvalent pneumococcal  conjugate vaccine - Pneumococcal conjugate vaccine 13-valent   Clarene Reamer, FNP-BC  [facility]  11/18/2018 1:26 PM

## 2018-11-20 ENCOUNTER — Encounter: Payer: Self-pay | Admitting: Family Medicine

## 2018-11-20 LAB — CYTOLOGY - PAP
Diagnosis: NEGATIVE
HPV: NOT DETECTED

## 2018-11-22 ENCOUNTER — Telehealth: Payer: Self-pay | Admitting: *Deleted

## 2018-11-22 NOTE — Telephone Encounter (Signed)
SPOKE WITH PT SEE ENCOUNTER

## 2018-12-20 ENCOUNTER — Other Ambulatory Visit (INDEPENDENT_AMBULATORY_CARE_PROVIDER_SITE_OTHER): Payer: PPO

## 2018-12-20 ENCOUNTER — Ambulatory Visit
Admission: RE | Admit: 2018-12-20 | Discharge: 2018-12-20 | Disposition: A | Discharge status: Home / Self Care | Payer: Medicare Other | Source: Ambulatory Visit | Attending: Family Medicine | Admitting: Family Medicine

## 2018-12-20 ENCOUNTER — Other Ambulatory Visit: Payer: Medicare Other

## 2018-12-20 ENCOUNTER — Ambulatory Visit
Admission: RE | Admit: 2018-12-20 | Discharge: 2018-12-20 | Disposition: A | Discharge status: Home / Self Care | Payer: PPO | Source: Ambulatory Visit | Attending: Family Medicine | Admitting: Family Medicine

## 2018-12-20 ENCOUNTER — Encounter: Payer: PPO | Admitting: *Deleted

## 2018-12-20 VITALS — BP 140/70 | HR 58 | Wt 223.6 lb

## 2018-12-20 DIAGNOSIS — R739 Hyperglycemia, unspecified: Secondary | ICD-10-CM | POA: Diagnosis not present

## 2018-12-20 DIAGNOSIS — Z1159 Encounter for screening for other viral diseases: Secondary | ICD-10-CM

## 2018-12-20 DIAGNOSIS — E2839 Other primary ovarian failure: Secondary | ICD-10-CM

## 2018-12-20 DIAGNOSIS — M85832 Other specified disorders of bone density and structure, left forearm: Secondary | ICD-10-CM | POA: Diagnosis not present

## 2018-12-20 DIAGNOSIS — Z1231 Encounter for screening mammogram for malignant neoplasm of breast: Secondary | ICD-10-CM

## 2018-12-20 DIAGNOSIS — Z780 Asymptomatic menopausal state: Secondary | ICD-10-CM | POA: Diagnosis not present

## 2018-12-20 DIAGNOSIS — Z006 Encounter for examination for normal comparison and control in clinical research program: Secondary | ICD-10-CM

## 2018-12-20 LAB — HEMOGLOBIN A1C: HEMOGLOBIN A1C: 6.2 % (ref 4.6–6.5)

## 2018-12-20 NOTE — Research (Signed)
Pt doing well, no complaints of cp or sob. Pt just celebrated her 50 birthday, she is very thankful for all the people she has met and helped her through her heart attack. Only med change is she isn't taking her amiodarone anymore, all other meds are the same.                                  "CONSENT"   YES     NO   Continuing further Investigational Product and study visits for follow-up? _0  _1   Continuing consent from future biomedical research _2  _3                                    "EVENTS"    YES     NO  AE   (IF YES SEE SOURCE) _4  _5   SAE  (IF YES SEE SOURCE) _6  _7   ENDPOINT   (IF YES SEE SOURCE) _8  _9   REVASCULARIZATION  (IF YES SEE SOURCE) _10  _11   AMPUTATION   (IF YES SEE SOURCE) _12  _13   TROPONIN'S  (IF YES SEE SOURCE) _14  _15    Lifestyle Adherence Assessment:    YES NO  Abstinence from smoking/remaining tobacco free X   Cardiac Diet X   Routine physical activity and/or cardiac rehabilitation X     Current Outpatient Medications:  .  atorvastatin (LIPITOR) 80 MG tablet, Take 1 tablet (80 mg total) by mouth daily at 6 PM., Disp: 90 tablet, Rfl: 3 .  carvedilol (COREG) 6.25 MG tablet, Take 1 tablet (6.25 mg total) by mouth 2 (two) times daily., Disp: 180 tablet, Rfl: 3 .  losartan (COZAAR) 25 MG tablet, Take 1 tablet (25 mg total) by mouth daily., Disp: 90 tablet, Rfl: 3 .  Multiple Vitamin (MULTIVITAMIN WITH MINERALS) TABS, Take 1 tablet by mouth daily., Disp: , Rfl:  .  nitroGLYCERIN (NITROSTAT) 0.4 MG SL tablet, Place 1 tablet (0.4 mg total) under the tongue every 5 (five) minutes as needed., Disp: 25 tablet, Rfl: 2 .  ticagrelor (BRILINTA) 90 MG TABS tablet, Take 1 tablet (90 mg total) by mouth 2 (two) times daily., Disp: 180 tablet, Rfl: 2 No current facility-administered medications for this visit.   Facility-Administered Medications Ordered in Other Visits:  .  0.9 %  sodium chloride infusion, , , Continuous PRN, End, Christopher, MD, Last Rate: 999 mL/hr at 05/31/18  2102, 999 mL/hr at 05/31/18 2102 .  amiodarone (NEXTERONE PREMIX) 360-4.14 MG/200ML-% (1.8 mg/mL) IV infusion, , , Continuous PRN, End, Christopher, MD, Last Rate: 33.3 mL/hr at 05/31/18 2130, 60 mg/hr at 05/31/18 2130 .  amiodarone (NEXTERONE) 1.8 mg/mL load via infusion, , , PRN, End, Christopher, MD, 150 mg at 05/31/18 2123 .  fentaNYL (SUBLIMAZE) injection, , , PRN, End, Christopher, MD, 25 mcg at 05/31/18 2126 .  heparin injection, , , PRN, End, Harrell Gave, MD, 5,000 Units at 05/31/18 2105 .  lidocaine (PF) (XYLOCAINE) 1 % injection, , , PRN, End, Christopher, MD, 2 mL at 05/31/18 2058 .  midazolam (VERSED) injection, , , PRN, End, Christopher, MD, 1 mg at 05/31/18 2126 .  Radial Cocktail/Verapamil only, , , PRN, End, Christopher, MD, 10 mL at 05/31/18 2059

## 2018-12-21 LAB — HEPATITIS C ANTIBODY
HEP C AB: NONREACTIVE
SIGNAL TO CUT-OFF: 0.02 (ref <1.00)

## 2018-12-23 ENCOUNTER — Encounter: Payer: Self-pay | Admitting: Family Medicine

## 2018-12-25 ENCOUNTER — Other Ambulatory Visit: Payer: Self-pay | Admitting: Family Medicine

## 2018-12-25 DIAGNOSIS — R928 Other abnormal and inconclusive findings on diagnostic imaging of breast: Secondary | ICD-10-CM

## 2018-12-31 ENCOUNTER — Ambulatory Visit
Admission: RE | Admit: 2018-12-31 | Discharge: 2018-12-31 | Disposition: A | Discharge status: Home / Self Care | Payer: PPO | Source: Ambulatory Visit | Attending: Family Medicine | Admitting: Family Medicine

## 2018-12-31 ENCOUNTER — Other Ambulatory Visit: Payer: Self-pay | Admitting: Family Medicine

## 2018-12-31 DIAGNOSIS — R928 Other abnormal and inconclusive findings on diagnostic imaging of breast: Secondary | ICD-10-CM

## 2018-12-31 DIAGNOSIS — N6489 Other specified disorders of breast: Secondary | ICD-10-CM | POA: Diagnosis not present

## 2019-01-13 NOTE — Progress Notes (Signed)
Follow-up Outpatient Visit Date: 01/17/2019  Primary Care Provider: Elby Beck, [address] [place] [address]  Chief Complaint: Follow-up coronary artery disease  HPI:  Lindsey Pierce is a 68 y.o. year-old female with history of coronary artery disease with anterior STEMI complicated by cardiogenic shock (LVEF 40-45% at time of MI; 60-65% on repeat echo in 08/2018), atrial fibrillation, and sustained ventricular tachycardia, as well as hypertension and hyperlipidemia, who presents for follow-up of coronary artery disease and ischemic cardiomyopathy.  She was last seen in our office by Ignacia Bayley, NP, in 09/2018.  At that time, she was doing well and continued to exercise after having graduated from cardiac rehab.  Amiodarone for PAF in the setting of STEMI was discontinued after her last visit.  Today, Lindsey Pierce reports that she has continued to do very well.  She exercises regularly and has been watching her diet other than over the last 2 weeks while she was [place] the mountains.  She plans to get back on her weight loss program now that she is home again.  She has not had any chest pain, shortness of breath, palpitations, lightheadedness, or edema.  She is tolerating her current medication regimen well, including aspirin and ticagrelor.  She has not had any significant bleeding.  --------------------------------------------------------------------------------------------------  Past Medical History:  Diagnosis Date  . CAD (coronary artery disease)    a. 05/2018 Ant STEMI/PCI: LM nl, LAD 10m(2.75 x 16 Synergy DES), D2 99 (PTCA), RI 40ost, LCX mild diff dzs, OM1/2/3 nl, RCA large, diffuse dzs, RPDA nl, RPAV 60, RPLB1 80 (small).  . Diabetes ([place])   . HFrEF (heart failure with reduced ejection fraction) ([place])    a. 05/2018 Echo: EF 40-45%, mod LVH, Gr2 DD, mid antsept, apical septal, apical AK. Mid-apical ant, mid infsept HK. Nl RV fxn. Triv TR. b. Echo 08/2018:  mod-sev. LVH, EF 60-65%, Grade 1 DD, AV-mild calcification, mild MR, mild dialation As. Aorta.  . Hyperlipidemia   . Hypertension   . Ischemic cardiomyopathy    a. 05/2018 Echo: EF 40-45%. b. 08/2018 Echo: EF 60-65%  . PAF (paroxysmal atrial fibrillation) ([place])    a. 05/2018 at time of STEMI, converted on Amio. No OAC.  Marland Kitchen STEMI involving left anterior descending coronary artery ([place])    a. 0/7622 - complicated by CGS req IABP and VT req amiodarone.  . V-tach ([place])    a. 05/2018 at time of Ant STEMI-->Amio.   Past Surgical History:  Procedure Laterality Date  . CORONARY/GRAFT ACUTE MI REVASCULARIZATION N/A 05/31/2018   Procedure: Coronary/Graft Acute MI Revascularization;  Surgeon: [name], MD;  Location: [facility];  Service: Cardiovascular;  Laterality: N/A;  . IABP INSERTION N/A 05/31/2018   Procedure: IABP Insertion;  Surgeon: [name], MD;  Location: [facility];  Service: Cardiovascular;  Laterality: N/A;  . LEFT HEART CATH AND CORONARY ANGIOGRAPHY N/A 05/31/2018   Procedure: LEFT HEART CATH AND CORONARY ANGIOGRAPHY;  Surgeon: [name], MD;  Location: [facility];  Service: Cardiovascular;  Laterality: N/A;    Current Meds  Medication Sig  . aspirin EC 81 MG tablet Take 81 mg by mouth daily.  Marland Kitchen atorvastatin (LIPITOR) 80 MG tablet Take 1 tablet (80 mg total) by mouth daily at 6 PM.  . carvedilol (COREG) 6.25 MG tablet Take 1 tablet (6.25 mg total) by mouth 2 (two) times daily.  Marland Kitchen losartan (COZAAR) 25 MG tablet Take 1 tablet (25 mg  total) by mouth daily.  . Multiple Vitamin (MULTIVITAMIN WITH MINERALS) TABS Take 1 tablet by mouth daily.  . nitroGLYCERIN (NITROSTAT) 0.4 MG SL tablet Place 1 tablet (0.4 mg total) under the tongue every 5 (five) minutes as needed.  . ticagrelor (BRILINTA) 90 MG TABS tablet Take 1 tablet (90 mg total) by mouth 2 (two) times daily.    Allergies: Codeine; Darvon [propoxyphene hcl]; and Penicillins  Social  History   Tobacco Use  . Smoking status: Never Smoker  . Smokeless tobacco: Never Used  Substance Use Topics  . Alcohol use: No  . Drug use: No    History reviewed. No pertinent family history.  Review of Systems: A 12-system review of systems was performed and was negative except as noted in the HPI.  --------------------------------------------------------------------------------------------------  Physical Exam: BP 122/78 (BP Location: Left Arm, Patient Position: Sitting, Cuff Size: Normal)   Pulse 61   Ht 5\' 1"  (1.549 m)   Wt 223 lb (101.2 kg)   BMI 42.14 kg/m   General: NAD. HEENT: No conjunctival pallor or scleral icterus. Moist mucous membranes.  OP clear. Neck: Supple without lymphadenopathy, thyromegaly, JVD, or HJR. Lungs: Normal work of breathing. Clear to auscultation bilaterally without wheezes or crackles. Heart: Regular rate and rhythm without murmurs, rubs, or gallops.  Unable to assess PMI due to body habitus. Abd: Bowel sounds present. Soft, NT/ND.  Unable to assess HSM due to body habitus. Ext: No lower extremity edema. Skin: Warm and dry without rash.  EKG: Sinus rhythm with occasional PVCs, left axis deviation, poor R wave progression, and inferior Q waves.  PVCs are new.  Otherwise, no significant change since 10/02/2018.  Lab Results  Component Value Date   WBC 8.4 06/06/2018   HGB 13.4 06/06/2018   HCT 40.5 06/06/2018   MCV 87.5 06/06/2018   PLT 267 06/06/2018    Lab Results  Component Value Date   NA 140 10/19/2018   K 4.4 10/19/2018   CL 106 10/19/2018   CO2 28 10/19/2018   BUN 19 10/19/2018   CREATININE 0.87 10/19/2018   GLUCOSE 173 (H) 10/19/2018   ALT 31 08/08/2018    Lab Results  Component Value Date   CHOL 118 08/08/2018   HDL 29 (L) 08/08/2018   LDLCALC 62 08/08/2018   TRIG 135 08/08/2018   CHOLHDL 4.1 08/08/2018     --------------------------------------------------------------------------------------------------  ASSESSMENT AND PLAN: Coronary artery disease No angina reported.  Lindsey Pierce has recovered well from her STEMI and PCI to the LAD/diagonal in 05/2018.  We will plan to complete at least 12 months of DAPT with aspirin and ticagrelor.  We will also continue with statin therapy.  I encouraged Ms. Diggins to remain active.  Ischemic cardiomyopathy Follow-up echo showed normalization of LVEF.  Ms. Diosdado appears euvolemic and well compensated with NYHA class I symptoms.  We will continue current doses of carvedilol and losartan.  Hyperlipidemia LDL at goal.  Continue atorvastatin 80 mg daily.  Paroxysmal atrial fibrillation No symptoms reported to suggest recurrence.  EKG today shows sinus rhythm with PVCs.  Patient is off amiodarone, which we will continue to hold as her a-fib was likely limited to the setting of acute MI.  Ventricular tachycardia No recurrence based on symptoms.  EKG today shows sinus rhythm with PVCs.  Patient is now off amiodarone, which we will continue to hold.  Continue carvedilol 6.25 mg twice daily.  Morbid obesity I congratulated Ms. Thalia Bloodgood on her weight loss thus far and have  encouraged her to continue with this through diet and exercise.  Follow-up: Return to clinic in 05/2019, which will be near the one-year anniversary of her MI.  [name], MD 01/18/2019 7:28 PM

## 2019-01-17 ENCOUNTER — Ambulatory Visit: Payer: Medicare Other | Admitting: Internal Medicine

## 2019-01-17 ENCOUNTER — Encounter: Payer: Self-pay | Admitting: Internal Medicine

## 2019-01-17 VITALS — BP 122/78 | HR 61 | Ht 61.0 in | Wt 223.0 lb

## 2019-01-17 DIAGNOSIS — I255 Ischemic cardiomyopathy: Secondary | ICD-10-CM | POA: Diagnosis not present

## 2019-01-17 DIAGNOSIS — E785 Hyperlipidemia, unspecified: Secondary | ICD-10-CM

## 2019-01-17 DIAGNOSIS — I472 Ventricular tachycardia, unspecified: Secondary | ICD-10-CM

## 2019-01-17 DIAGNOSIS — I480 Paroxysmal atrial fibrillation: Secondary | ICD-10-CM

## 2019-01-17 DIAGNOSIS — I2510 Atherosclerotic heart disease of native coronary artery without angina pectoris: Secondary | ICD-10-CM

## 2019-01-17 NOTE — Patient Instructions (Signed)
Medication Instructions:  - Your physician recommends that you continue on your current medications as directed. Please refer to the Current Medication list given to you today.  If you need a refill on your cardiac medications before your next appointment, please call your pharmacy.   Lab work: - none ordered  If you have labs (blood work) drawn today and your tests are completely normal, you will receive your results only by: Marland Kitchen MyChart Message (if you have MyChart) OR . A paper copy in the mail If you have any lab test that is abnormal or we need to change your treatment, we will call you to review the results.  Testing/Procedures: - none ordered  Follow-Up: At [facility], you and your health needs are our priority.  As part of our continuing mission to provide you with exceptional heart care, we have created designated Provider Care Teams.  These Care Teams include your primary Cardiologist (physician) and Advanced Practice Providers (APPs -  Physician Assistants and Nurse Practitioners) who all work together to provide you with the care you need, when you need it. You will need a follow up appointment in 4 months.  Please call our office 2 months in advance to schedule this appointment (call in early May).  You may see [name], MD or one of the following Advanced Practice Providers on your designated Care Team:   Murray Hodgkins, NP Christell Faith, PA-C . Marrianne Mood, PA-C  Any Other Special Instructions Will Be Listed Below (If Applicable). - N/A

## 2019-01-18 ENCOUNTER — Encounter: Payer: Self-pay | Admitting: Internal Medicine

## 2019-01-18 DIAGNOSIS — I255 Ischemic cardiomyopathy: Secondary | ICD-10-CM | POA: Insufficient documentation

## 2019-01-18 DIAGNOSIS — I2510 Atherosclerotic heart disease of native coronary artery without angina pectoris: Secondary | ICD-10-CM | POA: Insufficient documentation

## 2019-01-24 ENCOUNTER — Encounter: Payer: Self-pay | Admitting: Family Medicine

## 2019-02-25 ENCOUNTER — Telehealth: Payer: Self-pay | Admitting: *Deleted

## 2019-02-25 ENCOUNTER — Encounter: Payer: Self-pay | Admitting: *Deleted

## 2019-02-25 DIAGNOSIS — Z006 Encounter for examination for normal comparison and control in clinical research program: Secondary | ICD-10-CM

## 2019-02-25 NOTE — Research (Signed)
[facility] Visit 10  Pt doing well, no complaints of cp or sob. No med changes. Pt is staying in and ready for the virus to be over with.                                    "CONSENT"   YES     NO   Continuing further Investigational Product and study visits for follow-up? [x]  []   Continuing consent from future biomedical research [x]  []                                      "EVENTS"    YES     NO  AE   (IF YES SEE SOURCE) []  [x]   SAE  (IF YES SEE SOURCE) []  [x]   ENDPOINT   (IF YES SEE SOURCE) []  [x]   REVASCULARIZATION  (IF YES SEE SOURCE) []  [x]   AMPUTATION   (IF YES SEE SOURCE) []  [x]   TROPONIN'S  (IF YES SEE SOURCE) []  [x]     Current Outpatient Medications:  .  aspirin EC 81 MG tablet, Take 81 mg by mouth daily., Disp: , Rfl:  .  atorvastatin (LIPITOR) 80 MG tablet, Take 1 tablet (80 mg total) by mouth daily at 6 PM., Disp: 90 tablet, Rfl: 3 .  carvedilol (COREG) 6.25 MG tablet, Take 1 tablet (6.25 mg total) by mouth 2 (two) times daily., Disp: 180 tablet, Rfl: 3 .  losartan (COZAAR) 25 MG tablet, Take 1 tablet (25 mg total) by mouth daily., Disp: 90 tablet, Rfl: 3 .  Multiple Vitamin (MULTIVITAMIN WITH MINERALS) TABS, Take 1 tablet by mouth daily., Disp: , Rfl:  .  nitroGLYCERIN (NITROSTAT) 0.4 MG SL tablet, Place 1 tablet (0.4 mg total) under the tongue every 5 (five) minutes as needed., Disp: 25 tablet, Rfl: 2 .  ticagrelor (BRILINTA) 90 MG TABS tablet, Take 1 tablet (90 mg total) by mouth 2 (two) times daily., Disp: 180 tablet, Rfl: 2 No current facility-administered medications for this visit.   Facility-Administered Medications Ordered in Other Visits:  .  0.9 %  sodium chloride infusion, , , Continuous PRN, End, Christopher, MD, Last Rate: 999 mL/hr at 05/31/18 2102, 999 mL/hr at 05/31/18 2102 .  amiodarone (NEXTERONE PREMIX) 360-4.14 MG/200ML-% (1.8 mg/mL) IV infusion, , , Continuous PRN, End, Christopher, MD, Last Rate: 33.3 mL/hr at 05/31/18 2130, 60 mg/hr at 05/31/18 2130 .   amiodarone (NEXTERONE) 1.8 mg/mL load via infusion, , , PRN, End, Christopher, MD, 150 mg at 05/31/18 2123 .  fentaNYL (SUBLIMAZE) injection, , , PRN, End, Christopher, MD, 25 mcg at 05/31/18 2126 .  heparin injection, , , PRN, End, Harrell Gave, MD, 5,000 Units at 05/31/18 2105 .  lidocaine (PF) (XYLOCAINE) 1 % injection, , , PRN, End, Christopher, MD, 2 mL at 05/31/18 2058 .  midazolam (VERSED) injection, , , PRN, End, Christopher, MD, 1 mg at 05/31/18 2126 .  Radial Cocktail/Verapamil only, , , PRN, End, Christopher, MD, 10 mL at 05/31/18 2059

## 2019-02-25 NOTE — Telephone Encounter (Signed)
Pt called back see encounter

## 2019-02-27 IMAGING — MG DIGITAL DIAGNOSTIC UNILATERAL RIGHT MAMMOGRAM WITH TOMO AND CAD
4 series · 4 of 12 positions shown · non-contrast
Comparison: Previous exam(s).

CLINICAL DATA: Callback from screening mammogram for possible mass
right breast.

EXAM:
DIGITAL DIAGNOSTIC RIGHT MAMMOGRAM WITH CAD AND TOMO
ULTRASOUND RIGHT BREAST

[R CC synth-2D]
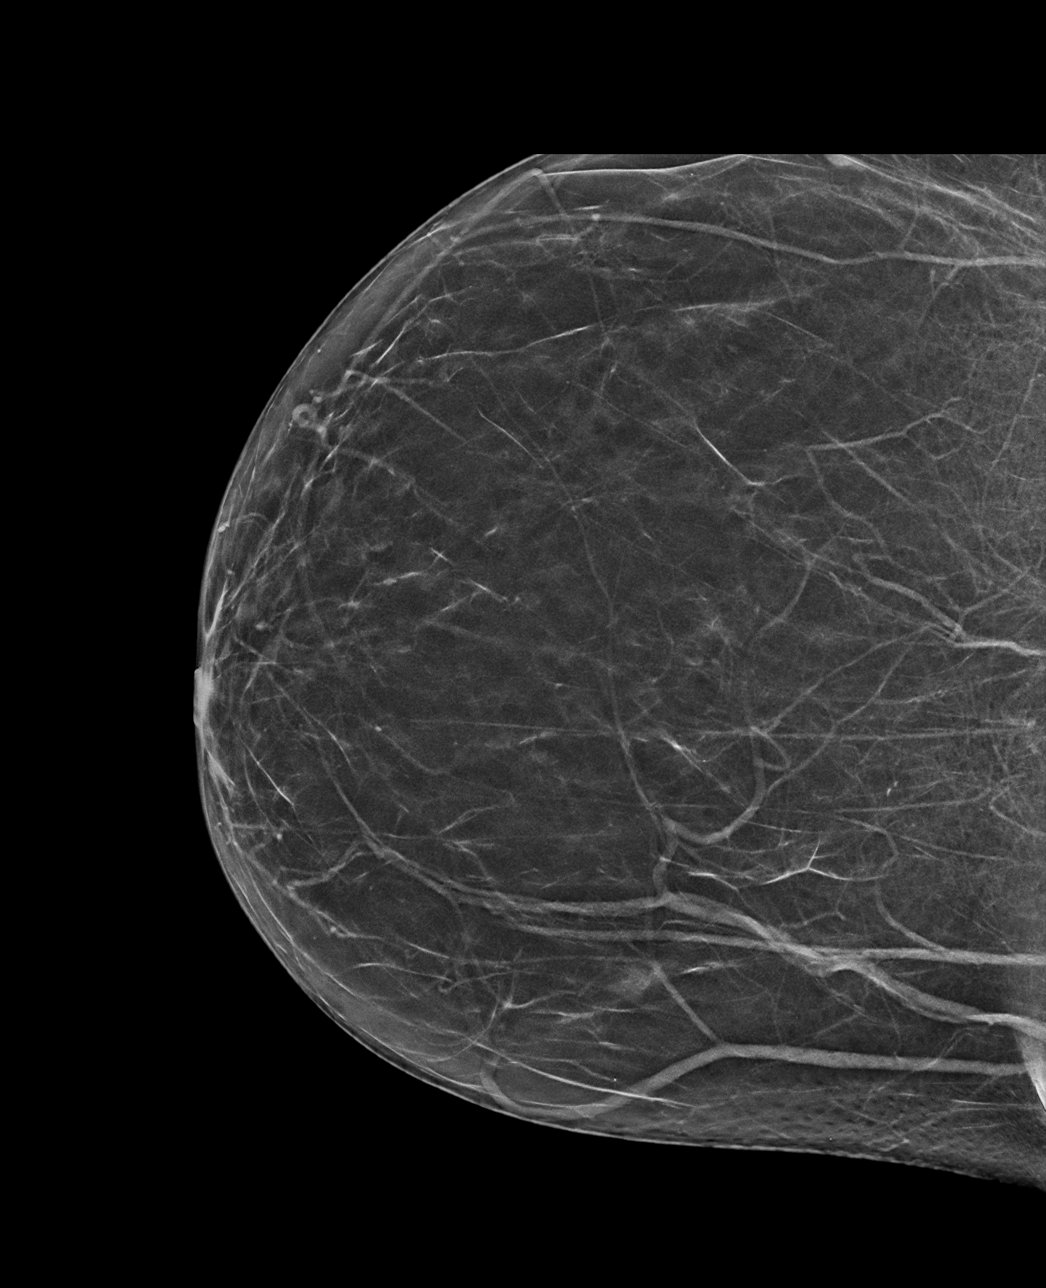

[R MLO synth-2D]
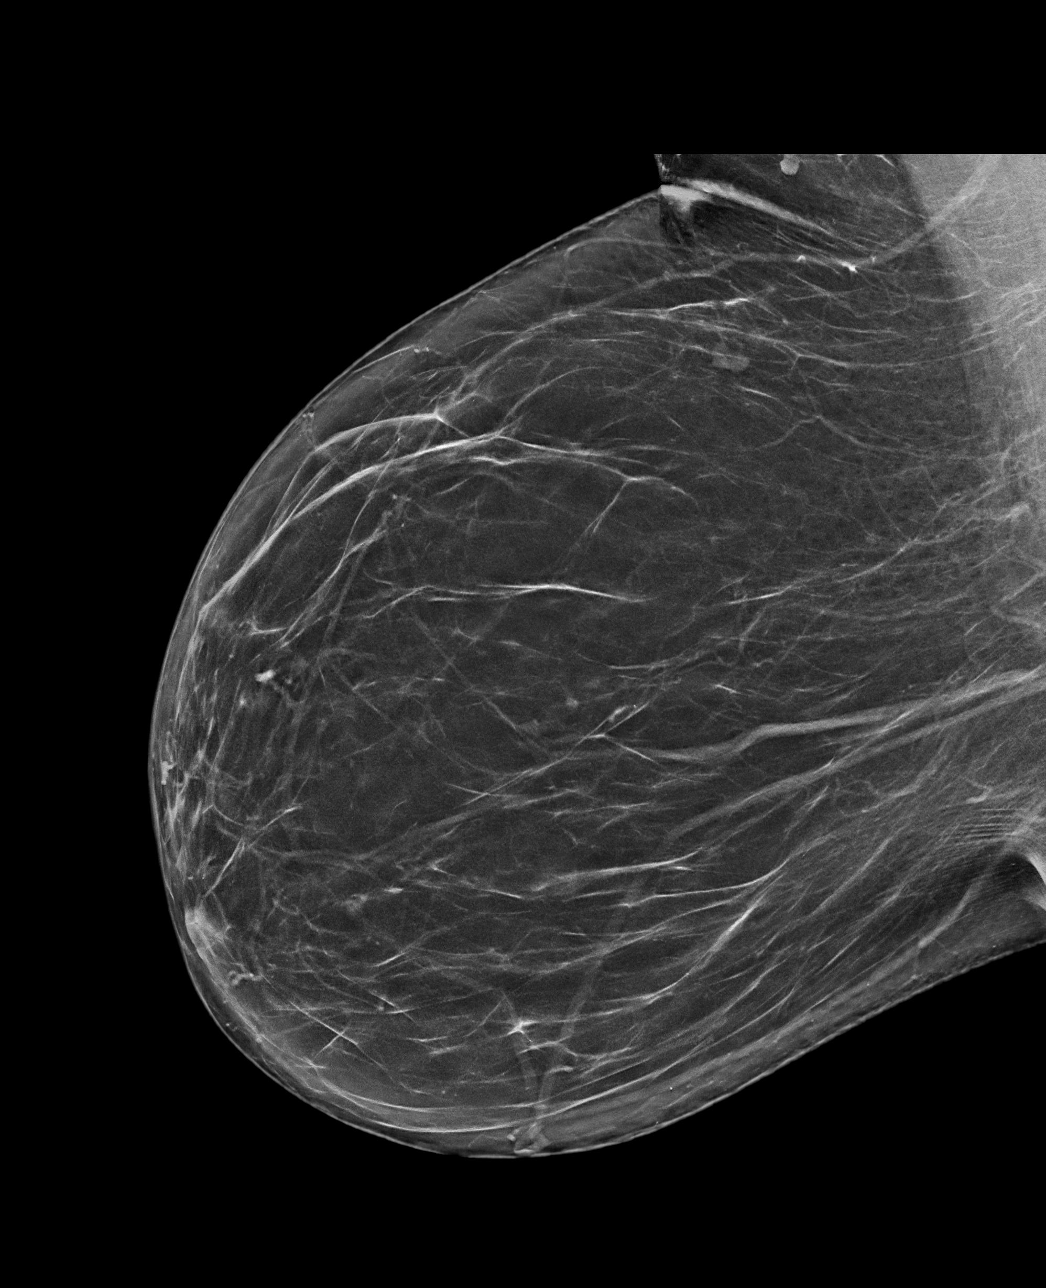

[R CC tomo · tomo slice 33/64.0]
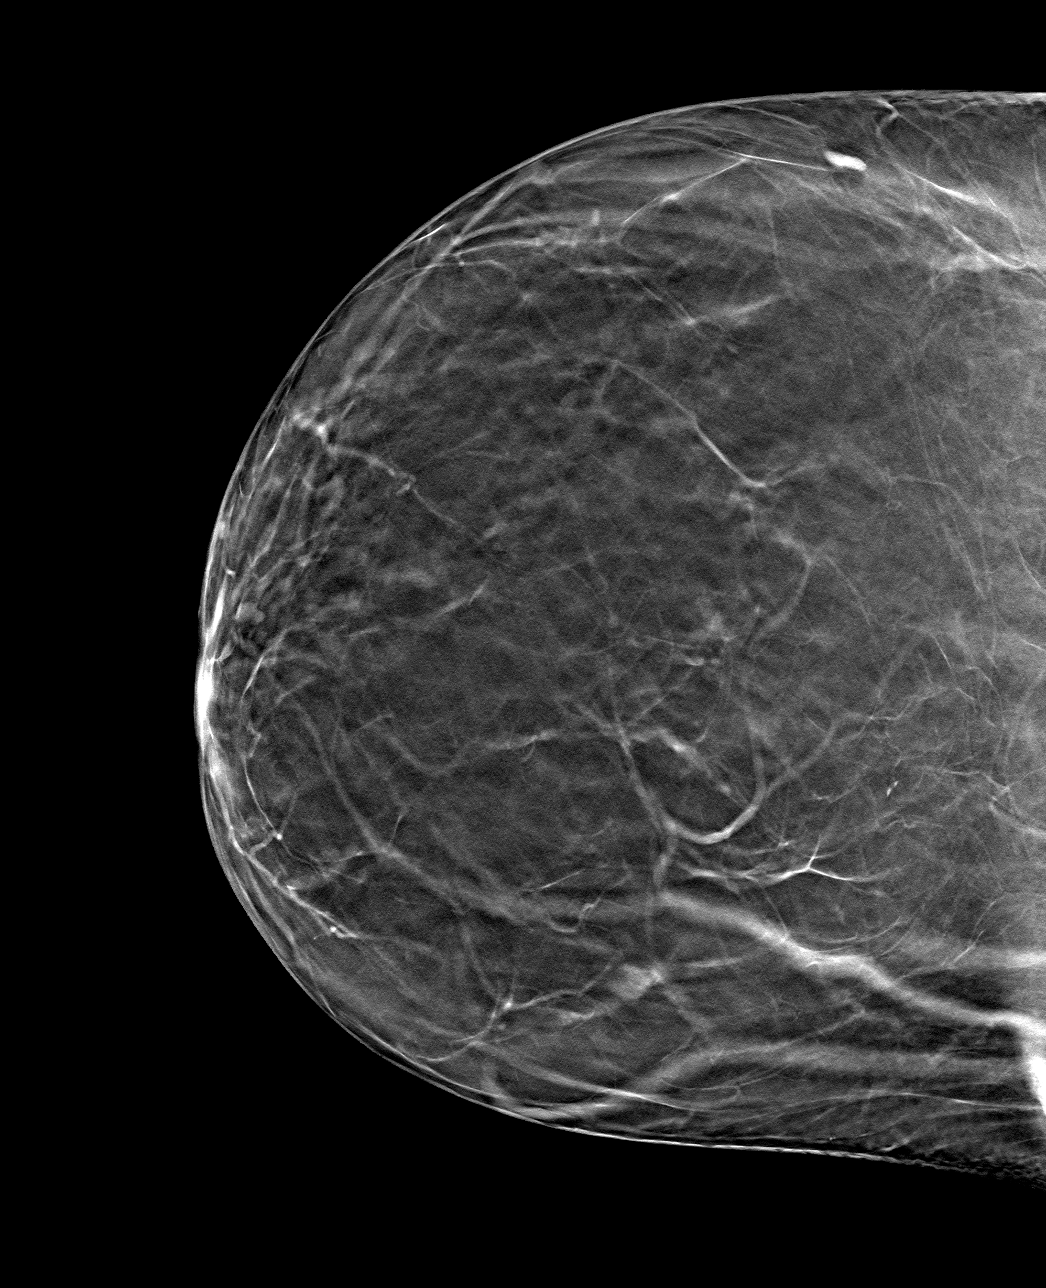

[R MLO tomo · tomo slice 39/76.0]
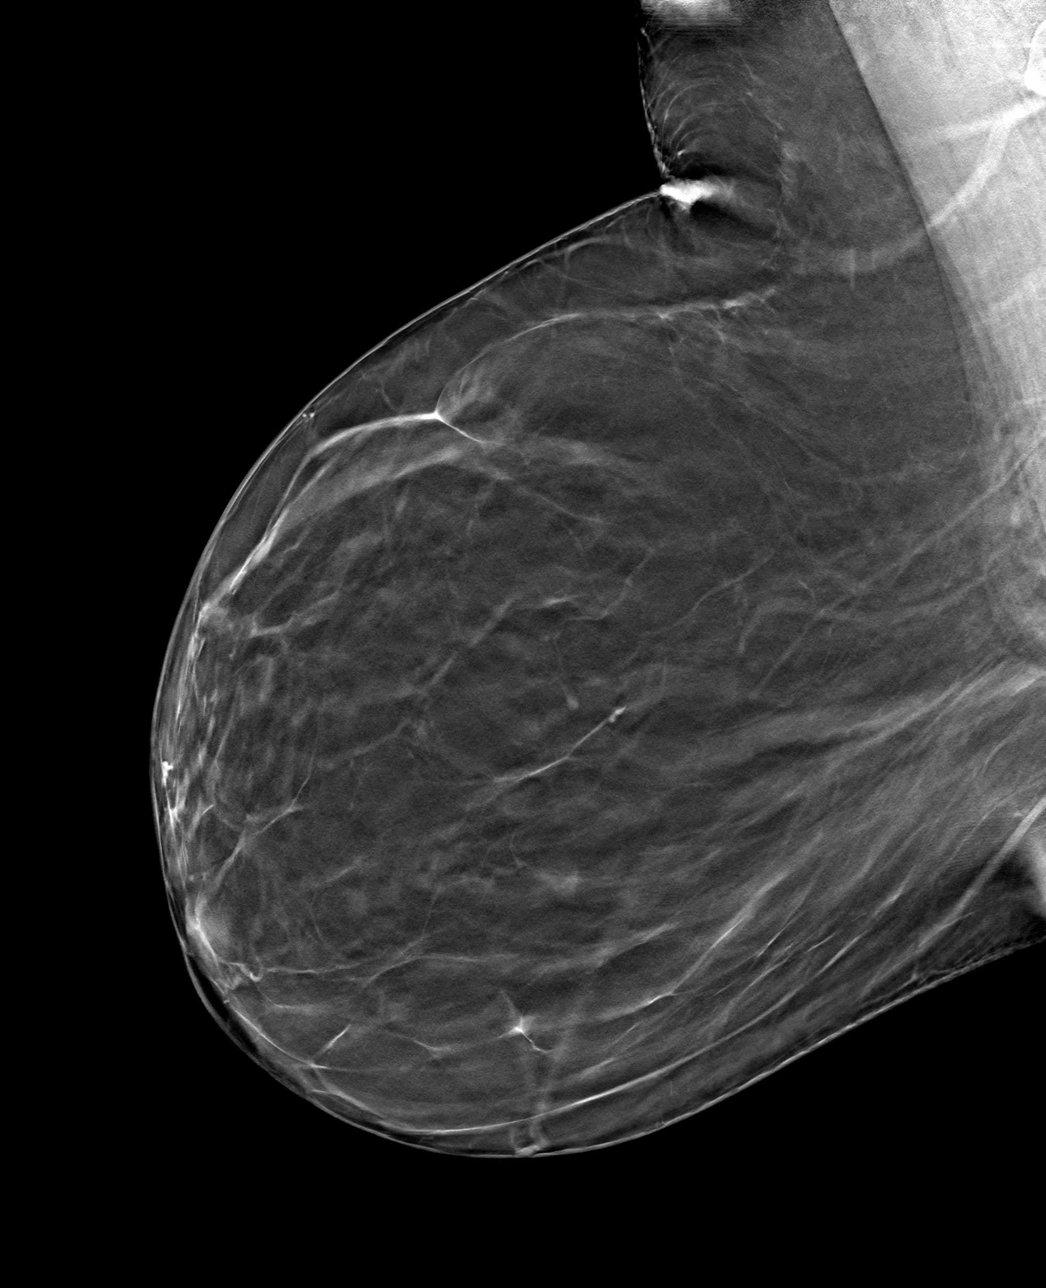

[4 of 12 positions shown; findings below may reference images not displayed]

ACR Breast Density Category b: There are scattered areas of
fibroglandular density.
FINDINGS: Cc and MLO views of the right breast are submitted. Previously
questioned mass in medial persists on additional views and and may
have focal notched fat. This may represent an intramammary lymph
node. No suspicious microcalcifications are identified in the right
breast.

Mammographic images were processed with CAD.

Targeted ultrasound is performed, showing no focal abnormal discrete
cystic or solid lesion in the medial right breast that would
correlate to the mammographic finding.
IMPRESSION: Probable benign findings.

RECOMMENDATION:
Six-month follow-up mammogram right breast.

I have discussed the findings and recommendations with the patient.
Results were also provided in writing at the conclusion of the
visit. If applicable, a reminder letter will be sent to the patient
regarding the next appointment.

BI-RADS CATEGORY  3: Probably benign.

## 2019-03-10 ENCOUNTER — Telehealth: Payer: Self-pay | Admitting: Internal Medicine

## 2019-03-10 NOTE — Telephone Encounter (Signed)
Pt c/o Shortness Of Breath: STAT if SOB developed within the last 24 hours or pt is noticeably SOB on the phone  1. Are you currently SOB (can you hear that pt is SOB on the phone)? Not bad right now   2. How long have you been experiencing SOB? Started today   3. Are you SOB when sitting or when up moving around? Moving around   4. Are you currently experiencing any other symptoms? Lightheadedness   Patient says it doesn't seem to be emergent but it is out of the norm and would like to discuss  Please call

## 2019-03-10 NOTE — Telephone Encounter (Signed)
Returned the call to the patient. She stated that she has been having shortness of breath that started this morning. This in only when she ambulates. She stated that at one point she felt light headed and had to sit down. She does not have a blood pressure cuff so is unable to check her blood pressure at home. Her heart rate has been steady in the 60-70's. This morning it was 62.  She denied chest pain and lower extremity edema.   She stated that this feeling alarms her because she was asymptomatic when she had her heart attack. She remembers having a fluttering feeling and then passing out so this new onset of shortness of breath alarms her.  Message routed to the provider for recommendations.

## 2019-03-10 NOTE — Progress Notes (Signed)
Cardiology Office Note  Date:  03/10/2019   ID:  Lindsey Pierce, DOB Apr 17, 1951, MRN [id number]  PCP:  Elby Beck, FNP   No chief complaint on file.   HPI:  Lindsey Pierce is a 68 y.o. year-old female with history of  coronary artery disease with anterior STEMI complicated by cardiogenic shock(LVEF 40-45% at time of MI; 60-65% on repeat echo in 08/2018),  atrial fibrillation, a sustained ventricular tachycardia,  hypertension a hyperlipidemia,  who presents for follow-up of coronary artery disease and ischemic cardiomyopathy.     Previously on amiodarone for PAF in the setting of STEMI was discontinued on a prior office visit   episode of shortness of breath and lightheadedness while walking from her bedroom to the kitchen this morning.  This is out of the ordinary for her.  After stopping for a few minutes, everything went back to normal.  She had another episode of mild dyspnea while walking around the house this afternoon, albeit without the lightheadedness.  She feels back to her baseline right now.  She has not had any chest pain.  She reports that her pulse has been regular between 62 and 68 bpm.  She does not check her blood pressure at home.  Given history of atypical symptoms leading up to her STEMI last year as well as atrial fibrillation and VT while in the hospital,   PMH:   has a past medical history of CAD (coronary artery disease), Diabetes ([place]), HFrEF (heart failure with reduced ejection fraction) ([place]), Hyperlipidemia, Hypertension, Ischemic cardiomyopathy, PAF (paroxysmal atrial fibrillation) ([place]), STEMI involving left anterior descending coronary artery ([place]), and V-tach ([place]).  PSH:    Past Surgical History:  Procedure Laterality Date  . CORONARY/GRAFT ACUTE MI REVASCULARIZATION N/A 05/31/2018   Procedure: Coronary/Graft Acute MI Revascularization;  Surgeon: [name], MD;  Location: [facility];  Service: Cardiovascular;  Laterality: N/A;  .  IABP INSERTION N/A 05/31/2018   Procedure: IABP Insertion;  Surgeon: [name], MD;  Location: [facility];  Service: Cardiovascular;  Laterality: N/A;  . LEFT HEART CATH AND CORONARY ANGIOGRAPHY N/A 05/31/2018   Procedure: LEFT HEART CATH AND CORONARY ANGIOGRAPHY;  Surgeon: [name], MD;  Location: [facility];  Service: Cardiovascular;  Laterality: N/A;    Current Outpatient Medications  Medication Sig Dispense Refill  . aspirin EC 81 MG tablet Take 81 mg by mouth daily.    Marland Kitchen atorvastatin (LIPITOR) 80 MG tablet Take 1 tablet (80 mg total) by mouth daily at 6 PM. 90 tablet 3  . carvedilol (COREG) 6.25 MG tablet Take 1 tablet (6.25 mg total) by mouth 2 (two) times daily. 180 tablet 3  . losartan (COZAAR) 25 MG tablet Take 1 tablet (25 mg total) by mouth daily. 90 tablet 3  . Multiple Vitamin (MULTIVITAMIN WITH MINERALS) TABS Take 1 tablet by mouth daily.    . nitroGLYCERIN (NITROSTAT) 0.4 MG SL tablet Place 1 tablet (0.4 mg total) under the tongue every 5 (five) minutes as needed. 25 tablet 2  . ticagrelor (BRILINTA) 90 MG TABS tablet Take 1 tablet (90 mg total) by mouth 2 (two) times daily. 180 tablet 2   No current facility-administered medications for this visit.    Facility-Administered Medications Ordered in Other Visits  Medication Dose Route Frequency Provider Last Rate Last Dose  . 0.9 %  sodium chloride infusion    Continuous PRN End, Christopher, MD 999 mL/hr at 05/31/18 2102 999 mL/hr at 05/31/18 2102  .  amiodarone (NEXTERONE PREMIX) 360-4.14 MG/200ML-% (1.8 mg/mL) IV infusion    Continuous PRN End, Christopher, MD 33.3 mL/hr at 05/31/18 2130 60 mg/hr at 05/31/18 2130  . amiodarone (NEXTERONE) 1.8 mg/mL load via infusion    PRN End, Harrell Gave, MD   150 mg at 05/31/18 2123  . fentaNYL (SUBLIMAZE) injection    PRN End, Harrell Gave, MD   25 mcg at 05/31/18 2126  . heparin injection    PRN End, Harrell Gave, MD   5,000 Units at 05/31/18 2105  . lidocaine  (PF) (XYLOCAINE) 1 % injection    PRN End, Harrell Gave, MD   2 mL at 05/31/18 2058  . midazolam (VERSED) injection    PRN End, Harrell Gave, MD   1 mg at 05/31/18 2126  . Radial Cocktail/Verapamil only    PRN End, Harrell Gave, MD   10 mL at 05/31/18 2059     Allergies:   Codeine; Darvon [propoxyphene hcl]; and Penicillins   Social History:  The patient  reports that she has never smoked. She has never used smokeless tobacco. She reports that she does not drink alcohol or use drugs.   Family History:   family history is not on file.    Review of Systems: ROS   PHYSICAL EXAM: VS:  There were no vitals taken for this visit. , BMI There is no height or weight on file to calculate BMI. GEN: Well nourished, well developed, in no acute distress HEENT: normal Neck: no JVD, carotid bruits, or masses Cardiac: RRR; no murmurs, rubs, or gallops,no edema  Respiratory:  clear to auscultation bilaterally, normal work of breathing GI: soft, nontender, nondistended, + BS MS: no deformity or atrophy Skin: warm and dry, no rash Neuro:  Strength and sensation are intact Psych: euthymic mood, full affect    Recent Labs: 06/03/2018: Magnesium 2.1 06/06/2018: Hemoglobin 13.4; Platelets 267 08/08/2018: ALT 31 08/26/2018: TSH 2.47 10/19/2018: BUN 19; Creatinine, Ser 0.87; Potassium 4.4; Sodium 140    Lipid Panel Lab Results  Component Value Date   CHOL 118 08/08/2018   HDL 29 (L) 08/08/2018   LDLCALC 62 08/08/2018   TRIG 135 08/08/2018      Wt Readings from Last 3 Encounters:  01/17/19 223 lb (101.2 kg)  12/20/18 223 lb 9.6 oz (101.4 kg)  11/18/18 216 lb (98 kg)       ASSESSMENT AND PLAN:  No diagnosis found.   Disposition:   F/U  6 months  No orders of the defined types were placed in this encounter.    Signed, Esmond Plants, M.D., Ph.D. 03/10/2019  [place], [place]

## 2019-03-10 NOTE — Telephone Encounter (Signed)
I spoke with Lindsey Pierce, who reports that she had an episode of shortness of breath and lightheadedness while walking from her bedroom to the kitchen this morning.  This is out of the ordinary for her.  After stopping for a few minutes, everything went back to normal.  She had another episode of mild dyspnea while walking around the house this afternoon, albeit without the lightheadedness.  She feels back to her baseline right now.  She has not had any chest pain.  She reports that her pulse has been regular between 62 and 68 bpm.  She does not check her blood pressure at home.  Given history of atypical symptoms leading up to her STEMI last year as well as atrial fibrillation and VT while in the hospital, I think it would be best to have [name] evaluated in the office 1 to 2 days.  I advised her to call 911 if her symptoms recur and do not improve promptly.

## 2019-03-10 NOTE — Telephone Encounter (Signed)
The patient is aware that she will be seeing Dr. Rockey Situ at 12 tomorrow 03/10/2019 and to come in through the main entrance to the hospital.   COVID-19 Pre-Screening Questions:  . Do you currently have a fever? No . Have you recently travelled on a cruise, internationally, or to [place], [place], [place], [place], [place], or [place], [place] [employer]) ? No  . Have you been in contact with someone that is currently pending confirmation of Covid19 testing or has been confirmed to have the [place] virus?  No . Are you currently experiencing fatigue or cough? No

## 2019-03-11 ENCOUNTER — Ambulatory Visit (INDEPENDENT_AMBULATORY_CARE_PROVIDER_SITE_OTHER): Payer: PPO | Admitting: Internal Medicine

## 2019-03-11 ENCOUNTER — Other Ambulatory Visit: Payer: Self-pay

## 2019-03-11 ENCOUNTER — Encounter: Payer: Self-pay | Admitting: Internal Medicine

## 2019-03-11 VITALS — BP 130/82 | HR 71 | Temp 98.5°F | Ht 61.0 in | Wt 225.2 lb

## 2019-03-11 DIAGNOSIS — R0602 Shortness of breath: Secondary | ICD-10-CM | POA: Diagnosis not present

## 2019-03-11 DIAGNOSIS — I2510 Atherosclerotic heart disease of native coronary artery without angina pectoris: Secondary | ICD-10-CM

## 2019-03-11 DIAGNOSIS — I480 Paroxysmal atrial fibrillation: Secondary | ICD-10-CM

## 2019-03-11 NOTE — Patient Instructions (Signed)
Medication Instructions:  No changes If you need a refill on your cardiac medications before your next appointment, please call your pharmacy.   Lab work: None ordered  Testing/Procedures: None ordered  Follow-Up: Dr. Saunders Revel would like for you to follow up in July 2020.

## 2019-03-11 NOTE — Progress Notes (Signed)
Follow-up Outpatient Visit Date: 03/11/2019  Primary Care Provider: Elby Beck, [place] [address]  Chief Complaint: Shortness of breath and lightheadedness  HPI:  Lindsey Pierce is a 68 y.o. year-old female with history of coronary artery disease with anterior STEMI complicated by cardiogenic shock(LVEF 40-45% at time of MI; 60-65% on repeat echo in 08/2018), atrial fibrillation, and sustained ventricular tachycardia in the immediate post MI period, as well as hypertension and hyperlipidemia, who presents for urgent evaluation of shortness of breath and lightheadedness.  I last saw Lindsey Pierce in early March, at which time she was doing well.  She contacted [place] yesterday due to episodes of shortness of breath with minimal activity (walking in her house).  The first time it happened, she also felt lightheaded.  She did not feel palpitations that her heart rate was elevated.  However, she was concerned because dyspnea was her presenting symptom at the time of her STEMI last summer.  Today, Lindsey Pierce reports that she is feeling better, though she still noted mild dyspnea when walking from her vehicle to the office today.  It is better than yesterday.  She has not had any further episodes of lightheadedness.  She also denies chest pain and palpitations.  She has gained some weight over the last month due to decreased activity in the setting of COVID-19 pandemic.  She does not believe that she is retaining fluid.  She denies orthopnea and PND.  She has been compliant with her medications, which she is tolerating well.  Lindsey Pierce denies bleeding, including melena, hematochezia, and hematuria.  --------------------------------------------------------------------------------------------------  Past Medical History:  Diagnosis Date  . CAD (coronary artery disease)    a. 05/2018 Ant STEMI/PCI: LM nl, LAD 30m(2.75 x 16 Synergy DES), D2 99 (PTCA), RI 40ost, LCX mild diff dzs,  OM1/2/3 nl, RCA large, diffuse dzs, RPDA nl, RPAV 60, RPLB1 80 (small).  . Diabetes ([place])   . HFrEF (heart failure with reduced ejection fraction) ([place])    a. 05/2018 Echo: EF 40-45%, mod LVH, Gr2 DD, mid antsept, apical septal, apical AK. Mid-apical ant, mid infsept HK. Nl RV fxn. Triv TR. b. Echo 08/2018: mod-sev. LVH, EF 60-65%, Grade 1 DD, AV-mild calcification, mild MR, mild dialation As. Aorta.  . Hyperlipidemia   . Hypertension   . Ischemic cardiomyopathy    a. 05/2018 Echo: EF 40-45%. b. 08/2018 Echo: EF 60-65%  . PAF (paroxysmal atrial fibrillation) ([place])    a. 05/2018 at time of STEMI, converted on Amio. No OAC.  Marland Kitchen STEMI involving left anterior descending coronary artery ([place])    a. 06/1016 - complicated by CGS req IABP and VT req amiodarone.  . V-tach ([place])    a. 05/2018 at time of Ant STEMI-->Amio.   Past Surgical History:  Procedure Laterality Date  . CORONARY/GRAFT ACUTE MI REVASCULARIZATION N/A 05/31/2018   Procedure: Coronary/Graft Acute MI Revascularization;  Surgeon: [name], MD;  Location: [facility];  Service: Cardiovascular;  Laterality: N/A;  . IABP INSERTION N/A 05/31/2018   Procedure: IABP Insertion;  Surgeon: [name], MD;  Location: [facility];  Service: Cardiovascular;  Laterality: N/A;  . LEFT HEART CATH AND CORONARY ANGIOGRAPHY N/A 05/31/2018   Procedure: LEFT HEART CATH AND CORONARY ANGIOGRAPHY;  Surgeon: [name], MD;  Location: [facility];  Service: Cardiovascular;  Laterality: N/A;    Current Meds  Medication Sig  . aspirin EC 81 MG tablet Take 81 mg  by mouth daily.  Marland Kitchen atorvastatin (LIPITOR) 80 MG tablet Take 1 tablet (80 mg total) by mouth daily at 6 PM.  . carvedilol (COREG) 6.25 MG tablet Take 1 tablet (6.25 mg total) by mouth 2 (two) times daily.  Marland Kitchen losartan (COZAAR) 25 MG tablet Take 1 tablet (25 mg total) by mouth daily.  . Multiple Vitamin (MULTIVITAMIN WITH MINERALS) TABS Take 1 tablet by mouth daily.  .  nitroGLYCERIN (NITROSTAT) 0.4 MG SL tablet Place 1 tablet (0.4 mg total) under the tongue every 5 (five) minutes as needed.  . ticagrelor (BRILINTA) 90 MG TABS tablet Take 1 tablet (90 mg total) by mouth 2 (two) times daily.    Allergies: Codeine; Darvon [propoxyphene hcl]; and Penicillins  Social History   Tobacco Use  . Smoking status: Never Smoker  . Smokeless tobacco: Never Used  Substance Use Topics  . Alcohol use: No  . Drug use: No    Family History  Problem Relation Age of Onset  . Valvular heart disease Mother     Review of Systems: A 12-system review of systems was performed and was negative except as noted in the HPI.  --------------------------------------------------------------------------------------------------  Physical Exam: BP 130/82 (BP Location: Left Arm, Patient Position: Sitting, Cuff Size: Normal)   Pulse 71   Temp 98.5 F (36.9 C)   Ht 5\' 1"  (1.549 m)   Wt 225 lb 4 oz (102.2 kg)   SpO2 98%   BMI 42.56 kg/m   General: NAD. HEENT: No conjunctival pallor or scleral icterus.  Mouth and nose covered with surgical mask. Neck: Supple without lymphadenopathy, thyromegaly, JVD, or HJR. Lungs: Normal work of breathing. Clear to auscultation bilaterally without wheezes or crackles. Heart: Regular rate and rhythm with 1/6 systolic murmur.  No rubs or gallops.  Unable to assess PMI due to body habitus. Abd: Bowel sounds present. Soft, NT/ND.  Unable to assess HSM due to body habitus. Ext: No lower extremity edema. Radial, PT, and DP pulses are 2+ bilaterally. Skin: Warm and dry without rash.  Patient ambulated around the office today, with oxygen saturation remaining at or above 97% on room air.  EKG: Normal sinus rhythm with left axis deviation, anteroseptal Q waves and inferior Q waves.  Compared with prior tracing from 01/17/2019, PVCs are no longer present.  Otherwise, there has been no significant interval change.  Lab Results  Component Value Date    WBC 8.4 06/06/2018   HGB 13.4 06/06/2018   HCT 40.5 06/06/2018   MCV 87.5 06/06/2018   PLT 267 06/06/2018    Lab Results  Component Value Date   NA 140 10/19/2018   K 4.4 10/19/2018   CL 106 10/19/2018   CO2 28 10/19/2018   BUN 19 10/19/2018   CREATININE 0.87 10/19/2018   GLUCOSE 173 (H) 10/19/2018   ALT 31 08/08/2018    Lab Results  Component Value Date   CHOL 118 08/08/2018   HDL 29 (L) 08/08/2018   LDLCALC 62 08/08/2018   TRIG 135 08/08/2018   CHOLHDL 4.1 08/08/2018    --------------------------------------------------------------------------------------------------  ASSESSMENT AND PLAN: Coronary artery disease with shortness of breath: Lindsey Pierce reports acute onset of shortness of breath yesterday with minimal activity.  This seems to be improving and is not present at rest.  She also had lightheadedness during the first occurrence yesterday, though this has not returned.  I am uncertain as to what precipitated her symptoms, though her EKG is unchanged and her exam benign today without evidence of ischemia  or decompensated heart failure.  I recommend that we continue her current medications.  If her exertional dyspnea does not improve or worsens, we will need to consider myocardial perfusion stress testing as well as ambulatory cardiac monitoring given history of paroxysmal atrial fibrillation and ventricular tachycardia.  Paroxysmal atrial fibrillation: Lindsey Pierce was noted to have only one episode in the setting of STEMI last summer.  She is currently not on anticoagulation and has also been taken off amiodarone.  If she has recurrent lightheadedness or continued/worsening shortness of breath, we will need to consider event monitor placement, as above.  Morbid obesity: Lindsey Pierce has put on weight due to inactivity in the setting of COVID-19 pandemic.  I encouraged her to increase her activity as tolerated.  Hopefully, as COVID-19 precautions ease, she will be able to  return to the gym and lose weight again.  Follow-up: Return to clinic in 2 to 3 months.  [name], MD 03/11/2019 12:12 PM

## 2019-03-21 ENCOUNTER — Ambulatory Visit: Payer: Medicare Other | Admitting: Family Medicine

## 2019-05-09 ENCOUNTER — Ambulatory Visit: Payer: PPO | Admitting: Family Medicine

## 2019-06-02 ENCOUNTER — Encounter: Payer: Self-pay | Admitting: Family Medicine

## 2019-06-03 ENCOUNTER — Other Ambulatory Visit: Payer: Self-pay | Admitting: Family Medicine

## 2019-06-03 DIAGNOSIS — R739 Hyperglycemia, unspecified: Secondary | ICD-10-CM

## 2019-06-03 DIAGNOSIS — I10 Essential (primary) hypertension: Secondary | ICD-10-CM

## 2019-06-03 DIAGNOSIS — Z79899 Other long term (current) drug therapy: Secondary | ICD-10-CM

## 2019-06-06 ENCOUNTER — Other Ambulatory Visit: Payer: Self-pay

## 2019-06-06 ENCOUNTER — Ambulatory Visit: Payer: PPO | Admitting: Family Medicine

## 2019-06-06 ENCOUNTER — Other Ambulatory Visit (INDEPENDENT_AMBULATORY_CARE_PROVIDER_SITE_OTHER): Payer: PPO

## 2019-06-06 DIAGNOSIS — R739 Hyperglycemia, unspecified: Secondary | ICD-10-CM | POA: Diagnosis not present

## 2019-06-06 DIAGNOSIS — Z79899 Other long term (current) drug therapy: Secondary | ICD-10-CM | POA: Diagnosis not present

## 2019-06-06 DIAGNOSIS — I10 Essential (primary) hypertension: Secondary | ICD-10-CM

## 2019-06-06 LAB — CBC WITH DIFFERENTIAL/PLATELET
Basophils Absolute: 0 10*3/uL (ref 0.0–0.1)
Basophils Relative: 0.8 % (ref 0.0–3.0)
Eosinophils Absolute: 0.1 10*3/uL (ref 0.0–0.7)
Eosinophils Relative: 2.1 % (ref 0.0–5.0)
HCT: 39 % (ref 36.0–46.0)
Hemoglobin: 13.4 g/dL (ref 12.0–15.0)
Lymphocytes Relative: 23.9 % (ref 12.0–46.0)
Lymphs Abs: 1.2 10*3/uL (ref 0.7–4.0)
MCHC: 34.2 g/dL (ref 30.0–36.0)
MCV: 86.6 fl (ref 78.0–100.0)
Monocytes Absolute: 0.6 10*3/uL (ref 0.1–1.0)
Monocytes Relative: 11.2 % (ref 3.0–12.0)
Neutro Abs: 3.2 10*3/uL (ref 1.4–7.7)
Neutrophils Relative %: 62 % (ref 43.0–77.0)
Platelets: 214 10*3/uL (ref 150.0–400.0)
RBC: 4.5 Mil/uL (ref 3.87–5.11)
RDW: 14.4 % (ref 11.5–15.5)
WBC: 5.1 10*3/uL (ref 4.0–10.5)

## 2019-06-06 LAB — HEMOGLOBIN A1C: Hgb A1c MFr Bld: 6.4 % (ref 4.6–6.5)

## 2019-06-06 LAB — BASIC METABOLIC PANEL
BUN: 19 mg/dL (ref 6–23)
CO2: 29 mEq/L (ref 19–32)
Calcium: 9.4 mg/dL (ref 8.4–10.5)
Chloride: 104 mEq/L (ref 96–112)
Creatinine, Ser: 0.82 mg/dL (ref 0.40–1.20)
GFR: 69.23 mL/min (ref >60.00)
Glucose, Bld: 153 mg/dL — ABNORMAL HIGH (ref 70–99)
Potassium: 4.7 mEq/L (ref 3.5–5.1)
Sodium: 140 mEq/L (ref 135–145)

## 2019-06-16 ENCOUNTER — Ambulatory Visit (INDEPENDENT_AMBULATORY_CARE_PROVIDER_SITE_OTHER): Payer: PPO | Admitting: Family Medicine

## 2019-06-16 ENCOUNTER — Other Ambulatory Visit: Payer: Self-pay

## 2019-06-16 ENCOUNTER — Encounter: Payer: Self-pay | Admitting: Family Medicine

## 2019-06-16 ENCOUNTER — Encounter: Payer: Self-pay | Admitting: *Deleted

## 2019-06-16 VITALS — BP 107/78 | Wt 221.4 lb

## 2019-06-16 DIAGNOSIS — I2510 Atherosclerotic heart disease of native coronary artery without angina pectoris: Secondary | ICD-10-CM

## 2019-06-16 DIAGNOSIS — E119 Type 2 diabetes mellitus without complications: Secondary | ICD-10-CM | POA: Insufficient documentation

## 2019-06-16 DIAGNOSIS — R7303 Prediabetes: Secondary | ICD-10-CM | POA: Diagnosis not present

## 2019-06-16 DIAGNOSIS — I10 Essential (primary) hypertension: Secondary | ICD-10-CM | POA: Diagnosis not present

## 2019-06-16 DIAGNOSIS — Z6841 Body Mass Index (BMI) 40.0 and over, adult: Secondary | ICD-10-CM | POA: Diagnosis not present

## 2019-06-16 DIAGNOSIS — Z1211 Encounter for screening for malignant neoplasm of colon: Secondary | ICD-10-CM

## 2019-06-16 DIAGNOSIS — Z006 Encounter for examination for normal comparison and control in clinical research program: Secondary | ICD-10-CM

## 2019-06-16 NOTE — Progress Notes (Signed)
Virtual Visit via Video Note  I connected with Lindsey Pierce on 06/16/19 at  8:00 AM EDT by a video enabled telemedicine application and verified that I am speaking with the correct person using two identifiers.  Location: Patient: In her home Provider: [place]   I discussed the limitations of evaluation and management by telemedicine and the availability of in person appointments. The patient expressed understanding and agreed to proceed.  History of Present Illness: This is a 68 yo female who presents today for virtual visit for chronic disease management.  She reports that she has been doing well through the pandemic and has been very careful about going out.  She continues to watch a couple of children during the day and is enjoying this.  She is also been participating in her [employer] via zoom.  Coronary artery disease- she had one episode of shortness of breath and followed up with her cardiologist.  The episode resolved spontaneously and there was no cause for symptoms found.  She has not had any further episodes.  She denies chest pain, shortness of breath, lower extremity edema.  She has been trying to get 8-10,000 steps in daily, mostly indoors because of the heat.  Prediabetes- hemoglobin A1c slightly elevated compared to prior reading, 6.4, previous value 6.2.  Patient is motivated to reduce her carbohydrate intake and continue to exercise daily.  Past Medical History:  Diagnosis Date  . CAD (coronary artery disease)    a. 05/2018 Ant STEMI/PCI: LM nl, LAD 39m2.75 x 16 Synergy DES), D2 99 (PTCA), RI 40ost, LCX mild diff dzs, OM1/2/3 nl, RCA large, diffuse dzs, RPDA nl, RPAV 60, RPLB1 80 (small).  . Diabetes ([place]   . HFrEF (heart failure with reduced ejection fraction) ([place]    a. 05/2018 Echo: EF 40-45%, mod LVH, Gr2 DD, mid antsept, apical septal, apical AK. Mid-apical ant, mid infsept HK. Nl RV fxn. Triv TR. b. Echo 08/2018: mod-sev. LVH, EF 60-65%, Grade 1  DD, AV-mild calcification, mild MR, mild dialation As. Aorta.  . Hyperlipidemia   . Hypertension   . Ischemic cardiomyopathy    a. 05/2018 Echo: EF 40-45%. b. 08/2018 Echo: EF 60-65%  . PAF (paroxysmal atrial fibrillation) ([place]    a. 05/2018 at time of STEMI, converted on Amio. No OAC.  .Marland KitchenSTEMI involving left anterior descending coronary artery ([place]    a. 73/3295- complicated by CGS req IABP and VT req amiodarone.  . V-tach ([place]    a. 05/2018 at time of Ant STEMI-->Amio.   Past Surgical History:  Procedure Laterality Date  . CORONARY/GRAFT ACUTE MI REVASCULARIZATION N/A 05/31/2018   Procedure: Coronary/Graft Acute MI Revascularization;  Surgeon: ENelva Bush MD;  [facility];  Service: Cardiovascular;  Laterality: N/A;  . IABP INSERTION N/A 05/31/2018   Procedure: IABP Insertion;  Surgeon: ENelva Bush MD;  [facility];  Service: Cardiovascular;  Laterality: N/A;  . LEFT HEART CATH AND CORONARY ANGIOGRAPHY N/A 05/31/2018   Procedure: LEFT HEART CATH AND CORONARY ANGIOGRAPHY;  Surgeon: ENelva Bush MD;  [facility];  Service: Cardiovascular;  Laterality: N/A;   Family History  Problem Relation Age of Onset  . Valvular heart disease Mother    Social History   Tobacco Use  . Smoking status: Never Smoker  . Smokeless tobacco: Never Used  Substance Use Topics  . Alcohol use: No  . Drug use: No      Observations/Objective: The patient  is alert and answers questions appropriately.  Visible skin is unremarkable.  She is normally conversive without shortness of breath, audible wheeze or witnessed cough.  Her mood and affect are appropriate.  BP 107/78 Comment: 06/13/2019  Wt 221 lb 6.4 oz (100.4 kg)   BMI 41.83 kg/m  Wt Readings from Last 3 Encounters:  06/16/19 221 lb 6.4 oz (100.4 kg)  03/11/19 225 lb 4 oz (102.2 kg)  01/17/19 223 lb (101.2 kg)    Assessment and Plan: 1. Coronary artery disease involving native  coronary artery of native heart without angina pectoris -Currently asymptomatic, has follow-up with cardiology at the end of the month  2. Essential hypertension -Well-controlled on current medications  3. Prediabetes -Discussed lab results and encouraged her to continue to work on diet, minimizing processed carbohydrates -Follow-up in 6 months for CPE and we will recheck labs at that time  4. Class 3 severe obesity due to excess calories with serious comorbidity and body mass index (BMI) of 40.0 to 44.9 in adult [facility]) -See #3  5.  Colon cancer screening -She has never had a colonoscopy and we have discussed Cologuard previously but I do not see where she returned to the test kit.  I have sent her information via my chart to see if she is okay with ordering this again.   Clarene Reamer, FNP-BC  [facility]  06/16/2019 8:23 AM   Follow Up Instructions: Visit recap sent to patient via my chart   I discussed the assessment and treatment plan with the patient. The patient was provided an opportunity to ask questions and all were answered. The patient agreed with the plan and demonstrated an understanding of the instructions.   The patient was advised to call back or seek an in-person evaluation if the symptoms worsen or if the condition fails to improve as anticipated.   Elby Beck, FNP

## 2019-06-16 NOTE — Research (Signed)
[facility] V11 Visit conducted by phone due to Covid-19  Pt dong well, no complaints of cp or sob. No med changes at this time. I thanked her for participating in the study.                                   "CONSENT"   YES     NO   Continuing further Investigational Product and study visits for follow-up? [x]  []   Continuing consent from future biomedical research [x]  []                                    "EVENTS"    YES     NO  AE   (IF YES SEE SOURCE) []  [x]   SAE  (IF YES SEE SOURCE) []  [x]   ENDPOINT   (IF YES SEE SOURCE) []  [x]   REVASCULARIZATION  (IF YES SEE SOURCE) []  [x]   AMPUTATION   (IF YES SEE SOURCE) []  [x]   TROPONIN'S  (IF YES SEE SOURCE) []  [x]    Lifestyle Adherence Assessment:   YES NO  Abstinence from smoking/remaining tobacco free X   Cardiac Diet X   Routine physical activity and/or cardiac rehabilitation X     Current Outpatient Medications:  .  aspirin EC 81 MG tablet, Take 81 mg by mouth daily., Disp: , Rfl:  .  atorvastatin (LIPITOR) 80 MG tablet, Take 1 tablet (80 mg total) by mouth daily at 6 PM., Disp: 90 tablet, Rfl: 3 .  carvedilol (COREG) 6.25 MG tablet, Take 1 tablet (6.25 mg total) by mouth 2 (two) times daily., Disp: 180 tablet, Rfl: 3 .  losartan (COZAAR) 25 MG tablet, Take 1 tablet (25 mg total) by mouth daily., Disp: 90 tablet, Rfl: 3 .  Multiple Vitamin (MULTIVITAMIN WITH MINERALS) TABS, Take 1 tablet by mouth daily., Disp: , Rfl:  .  nitroGLYCERIN (NITROSTAT) 0.4 MG SL tablet, Place 1 tablet (0.4 mg total) under the tongue every 5 (five) minutes as needed., Disp: 25 tablet, Rfl: 2 .  ticagrelor (BRILINTA) 90 MG TABS tablet, Take 1 tablet (90 mg total) by mouth 2 (two) times daily., Disp: 180 tablet, Rfl: 2 No current facility-administered medications for this visit.   Facility-Administered Medications Ordered in Other Visits:  .  0.9 %  sodium chloride infusion, , , Continuous PRN, End, Christopher, MD, Last Rate: 999 mL/hr at 05/31/18 2102, 999 mL/hr at  05/31/18 2102 .  amiodarone (NEXTERONE PREMIX) 360-4.14 MG/200ML-% (1.8 mg/mL) IV infusion, , , Continuous PRN, End, Christopher, MD, Last Rate: 33.3 mL/hr at 05/31/18 2130, 60 mg/hr at 05/31/18 2130 .  amiodarone (NEXTERONE) 1.8 mg/mL load via infusion, , , PRN, End, Christopher, MD, 150 mg at 05/31/18 2123 .  fentaNYL (SUBLIMAZE) injection, , , PRN, End, Christopher, MD, 25 mcg at 05/31/18 2126 .  heparin injection, , , PRN, End, Harrell Gave, MD, 5,000 Units at 05/31/18 2105 .  lidocaine (PF) (XYLOCAINE) 1 % injection, , , PRN, End, Christopher, MD, 2 mL at 05/31/18 2058 .  midazolam (VERSED) injection, , , PRN, End, Christopher, MD, 1 mg at 05/31/18 2126 .  Radial Cocktail/Verapamil only, , , PRN, End, Christopher, MD, 10 mL at 05/31/18 2059

## 2019-06-17 ENCOUNTER — Other Ambulatory Visit: Payer: Self-pay

## 2019-06-17 MED ORDER — TICAGRELOR 90 MG PO TABS: 90.0000 mg | ORAL_TABLET | Freq: Two times a day (BID) | ORAL | 3 refills | Status: DC

## 2019-06-17 NOTE — Telephone Encounter (Signed)
Refill sent for Brilinita 90 mg

## 2019-06-18 ENCOUNTER — Other Ambulatory Visit: Payer: Self-pay | Admitting: *Deleted

## 2019-06-18 ENCOUNTER — Encounter: Payer: Self-pay | Admitting: Family Medicine

## 2019-06-18 MED ORDER — CARVEDILOL 6.25 MG PO TABS: 6.2500 mg | ORAL_TABLET | Freq: Two times a day (BID) | ORAL | 2 refills | Status: DC

## 2019-06-18 NOTE — Telephone Encounter (Signed)
Requested Prescriptions   Signed Prescriptions Disp Refills  . carvedilol (COREG) 6.25 MG tablet 180 tablet 2    Sig: Take 1 tablet (6.25 mg total) by mouth 2 (two) times daily.    Authorizing Provider: END, CHRISTOPHER    Ordering User: Britt Bottom

## 2019-07-03 ENCOUNTER — Ambulatory Visit
Admission: RE | Admit: 2019-07-03 | Discharge: 2019-07-03 | Disposition: A | Discharge status: Home / Self Care | Payer: PPO | Source: Ambulatory Visit | Attending: Family Medicine | Admitting: Family Medicine

## 2019-07-03 ENCOUNTER — Ambulatory Visit: Payer: PPO | Admitting: Nurse Practitioner

## 2019-07-03 ENCOUNTER — Other Ambulatory Visit: Payer: Self-pay

## 2019-07-03 ENCOUNTER — Other Ambulatory Visit: Payer: Self-pay | Admitting: Family Medicine

## 2019-07-03 DIAGNOSIS — N6489 Other specified disorders of breast: Secondary | ICD-10-CM

## 2019-07-03 DIAGNOSIS — R928 Other abnormal and inconclusive findings on diagnostic imaging of breast: Secondary | ICD-10-CM | POA: Diagnosis not present

## 2019-07-03 DIAGNOSIS — N6310 Unspecified lump in the right breast, unspecified quadrant: Secondary | ICD-10-CM

## 2019-07-21 NOTE — Progress Notes (Deleted)
Cardiology Office Note    Date:  07/21/2019   ID:  Lindsey Pierce, DOB 1951/11/07, MRN [id number]  PCP:  Elby Beck, FNP  Cardiologist:  [name], MD  Electrophysiologist:  None   Chief Complaint: Follow up  History of Present Illness:   Lindsey Pierce is a 68 y.o. female with history of CAD with anterior ST elevation MI in 123XX123 complicated by cardiogenic shock, sustained VT in the immediate post MI timeframe, lone A. Fib not on OAC, HFrEF secondary to ICM, DM2, HTN, and HLD who presents for follow-up of the above.  Patient was admitted to the hospital in 05/2018 with an anterior STEMI complicated by PAF, V. tach, and cardiogenic shock.  She underwent emergent LHC which revealed an occluded LAD that was treated successfully with PCI/DES.  She required placement of IABP at that time secondary to hypotension.  Cardioversion was attempted for her A. fib though was unsuccessful.  She was eventually pharmacologically converted with amiodarone.  Following removal of her IABP, she experienced multiple runs of asymptomatic NSVT for which amiodarone was continued.  Echo at that time showed an LVEF of 40 to 45%.  Subsequent echo in 08/2018 showed improvement in LV systolic function with an EF of 60 to 65%.  She was most recently seen in 02/2019 with acute onset of shortness of breath with minimal activity and lightheadedness x1 day with improvement in symptoms when she was seen in the office.  Her EKG was unchanged.  Her exam was unremarkable.  No medication changes were made at that time.  ***   Labs: 05/2019 - Hgb 13.4, PLT 214, potassium 4.7, serum creatinine 0.82, A1c 6.4 08/2018 -TSH normal 07/2018 - AST/ALT normal, albumin 4.4, total cholesterol 118, triglyceride 135, HDL 29, LDL 62  Past Medical History:  Diagnosis Date  . CAD (coronary artery disease)    a. 05/2018 Ant STEMI/PCI: LM nl, LAD 59m(2.75 x 16 Synergy DES), D2 99 (PTCA), RI 40ost, LCX mild diff dzs, OM1/2/3 nl, RCA  large, diffuse dzs, RPDA nl, RPAV 60, RPLB1 80 (small).  . Diabetes ([place])   . HFrEF (heart failure with reduced ejection fraction) ([place])    a. 05/2018 Echo: EF 40-45%, mod LVH, Gr2 DD, mid antsept, apical septal, apical AK. Mid-apical ant, mid infsept HK. Nl RV fxn. Triv TR. b. Echo 08/2018: mod-sev. LVH, EF 60-65%, Grade 1 DD, AV-mild calcification, mild MR, mild dialation As. Aorta.  . Hyperlipidemia   . Hypertension   . Ischemic cardiomyopathy    a. 05/2018 Echo: EF 40-45%. b. 08/2018 Echo: EF 60-65%  . PAF (paroxysmal atrial fibrillation) ([place])    a. 05/2018 at time of STEMI, converted on Amio. No OAC.  Marland Kitchen STEMI involving left anterior descending coronary artery ([place])    a. 123XX123 - complicated by CGS req IABP and VT req amiodarone.  . V-tach ([place])    a. 05/2018 at time of Ant STEMI-->Amio.    Past Surgical History:  Procedure Laterality Date  . CORONARY/GRAFT ACUTE MI REVASCULARIZATION N/A 05/31/2018   Procedure: Coronary/Graft Acute MI Revascularization;  Surgeon: [name], MD;  Location: [facility];  Service: Cardiovascular;  Laterality: N/A;  . IABP INSERTION N/A 05/31/2018   Procedure: IABP Insertion;  Surgeon: [name], MD;  Location: [facility];  Service: Cardiovascular;  Laterality: N/A;  . LEFT HEART CATH AND CORONARY ANGIOGRAPHY N/A 05/31/2018   Procedure: LEFT HEART CATH AND CORONARY ANGIOGRAPHY;  Surgeon: [name], MD;  Location: [facility];  Service: Cardiovascular;  Laterality: N/A;    Current Medications: No outpatient medications have been marked as taking for the 07/25/19 encounter (Appointment) with Rise Mu, PA-C.    Allergies:   Codeine, Darvon [propoxyphene hcl], and Penicillins   Social History   Socioeconomic History  . Marital status: Divorced    Spouse name: Not on file  . Number of children: Not on file  . Years of education: Not on file  . Highest education level: Not on file  Occupational History  .  Not on file  Social Needs  . Financial resource strain: Not on file  . Food insecurity    Worry: Not on file    Inability: Not on file  . Transportation needs    Medical: Not on file    Non-medical: Not on file  Tobacco Use  . Smoking status: Never Smoker  . Smokeless tobacco: Never Used  Substance and Sexual Activity  . Alcohol use: No  . Drug use: No  . Sexual activity: Not on file  Lifestyle  . Physical activity    Days per week: Not on file    Minutes per session: Not on file  . Stress: Not on file  Relationships  . Social Herbalist on phone: Not on file    Gets together: Not on file    Attends religious service: Not on file    Active member of club or organization: Not on file    Attends meetings of clubs or organizations: Not on file    Relationship status: Not on file  Other Topics Concern  . Not on file  Social History Narrative  . Not on file     Family History:  The patient's family history includes Valvular heart disease in her mother.  ROS:   ROS   EKGs/Labs/Other Studies Reviewed:    Studies reviewed were summarized above. The additional studies were reviewed today:  2D Echo 08/2018: - Left ventricle: The cavity size was normal. Wall thickness was   increased in a pattern of moderate to severe LVH. Systolic   function was normal. The estimated ejection fraction was in the   range of 60% to 65%. Doppler parameters are consistent with   abnormal left ventricular relaxation (grade 1 diastolic   dysfunction). Doppler parameters are consistent with high   ventricular filling pressure. - Aortic valve: Trileaflet; mildly thickened, mildly calcified   leaflets. - Ascending aorta: The ascending aorta was mildly dilated. - Mitral valve: Calcified annulus. There was mild regurgitation. __________  LHC 05/2018: Conclusions: 1. Anterior STEMI due to acute plaque rupture and thrombotic thrombotic subtotal occlusion of the mid LAD and D2. 2.  Moderate to severe but non-critical disease involving rPL branches. 3. Cardiogenic shock with mildly to moderately elevated LVEDP. 4. Successful PCI to mid LAD/D2 using Synergy 2.75 x 16 mm drug eluting stent in the LAD with 0% residual stenosis and angioplasty of the ostium of D2 with reduction of stenosis from 99% to 70%. 5. Successful placement of 40 mL intra-aortic balloon pump via the right femoral artery. 6. Atrial fibrillation with rapid ventricular response with unsuccessful DCCV x 1 at 200 J.  Rate control improved with amiodarone infusion.  Recommendations: 1. Administer ticagrelor 180 mg when possible and discontinue cangrelor 2 hours after ticagrelor administration. 2. Titrate off norepinephrine as tolerated. 3. Aggressive secondary prevention, including high-intensity statin therapy. 4. Start heparin infusion 2 hours after TR band removal. 5. Continue amiodarone infusion  for rate control of atrial fibrillation.  Consider transitioning to beta-blocker once shock has resolved. 6. CXR when patient arrives in ICU to confirm IABP placement. 7. Wean BiPAP, as tolerated.  Recommend uninterrupted dual antiplatelet therapy with Aspirin 81mg  daily and Ticagrelor 90mg  twice daily for a minimum of 12 months (ACS - Class I recommendation).    EKG:  EKG is ordered today.  The EKG ordered today demonstrates ***  Recent Labs: 08/08/2018: ALT 31 08/26/2018: TSH 2.47 06/06/2019: BUN 19; Creatinine, Ser 0.82; Hemoglobin 13.4; Platelets 214.0; Potassium 4.7; Sodium 140  Recent Lipid Panel    Component Value Date/Time   CHOL 118 08/08/2018 0807   TRIG 135 08/08/2018 0807   HDL 29 (L) 08/08/2018 0807   CHOLHDL 4.1 08/08/2018 0807   VLDL 27 08/08/2018 0807   LDLCALC 62 08/08/2018 0807    PHYSICAL EXAM:    VS:  There were no vitals taken for this visit.  BMI: There is no height or weight on file to calculate BMI.  Physical Exam  Wt Readings from Last 3 Encounters:  06/16/19 221 lb 6.4  oz (100.4 kg)  03/11/19 225 lb 4 oz (102.2 kg)  01/17/19 223 lb (101.2 kg)     ASSESSMENT & PLAN:   1. ***  Disposition: F/u with Dr. Saunders Revel or an APP in ***.   Medication Adjustments/Labs and Tests Ordered: Current medicines are reviewed at length with the patient today.  Concerns regarding medicines are outlined above. Medication changes, Labs and Tests ordered today are summarized above and listed in the Patient Instructions accessible in Encounters.   Signed, Christell Faith, PA-C 07/21/2019 10:10 AM     [facility] [address] [place], [place] [address] [phone]

## 2019-07-25 ENCOUNTER — Ambulatory Visit: Payer: PPO | Admitting: Physician Assistant

## 2019-08-21 ENCOUNTER — Other Ambulatory Visit: Payer: Self-pay | Admitting: Family Medicine

## 2019-08-21 DIAGNOSIS — E785 Hyperlipidemia, unspecified: Secondary | ICD-10-CM

## 2019-09-05 ENCOUNTER — Ambulatory Visit: Payer: PPO | Admitting: Internal Medicine

## 2019-09-19 ENCOUNTER — Other Ambulatory Visit: Payer: Self-pay | Admitting: *Deleted

## 2019-09-19 MED ORDER — LOSARTAN POTASSIUM 25 MG PO TABS: 25.0000 mg | ORAL_TABLET | Freq: Every day | ORAL | 0 refills | Status: DC

## 2019-12-19 ENCOUNTER — Other Ambulatory Visit: Payer: Self-pay

## 2019-12-19 MED ORDER — LOSARTAN POTASSIUM 25 MG PO TABS: 25.0000 mg | ORAL_TABLET | Freq: Every day | ORAL | 0 refills | Status: DC

## 2019-12-25 ENCOUNTER — Other Ambulatory Visit: Payer: Self-pay | Admitting: Family Medicine

## 2019-12-25 DIAGNOSIS — E785 Hyperlipidemia, unspecified: Secondary | ICD-10-CM

## 2020-01-09 ENCOUNTER — Encounter: Payer: Self-pay | Admitting: Family Medicine

## 2020-01-21 ENCOUNTER — Other Ambulatory Visit: Payer: Self-pay

## 2020-01-21 ENCOUNTER — Encounter: Payer: Self-pay | Admitting: Family Medicine

## 2020-01-21 ENCOUNTER — Ambulatory Visit (INDEPENDENT_AMBULATORY_CARE_PROVIDER_SITE_OTHER): Payer: PPO | Admitting: Family Medicine

## 2020-01-21 VITALS — BP 118/78 | HR 68 | Temp 98.2°F | Ht 61.0 in | Wt 227.4 lb

## 2020-01-21 DIAGNOSIS — F4321 Adjustment disorder with depressed mood: Secondary | ICD-10-CM

## 2020-01-21 DIAGNOSIS — R7303 Prediabetes: Secondary | ICD-10-CM

## 2020-01-21 DIAGNOSIS — I10 Essential (primary) hypertension: Secondary | ICD-10-CM | POA: Diagnosis not present

## 2020-01-21 MED ORDER — LOSARTAN POTASSIUM 25 MG PO TABS: 25.0000 mg | ORAL_TABLET | Freq: Every day | ORAL | 3 refills | Status: DC

## 2020-01-21 NOTE — Patient Instructions (Signed)
Good to see you today  Follow up in 3 months for your complete physical  Hang in there- pick one thing a week to work on. Such as increased water intake, walking, pick a meal to make healthy choices.

## 2020-01-21 NOTE — Progress Notes (Signed)
Subjective:    Patient ID: Lindsey Pierce, female    DOB: [birth year], 69 y.o.   MRN: [id number]  HPI Chief Complaint  Patient presents with  . Follow-up    Depression  Is a 69 year old female who presents today for office visit to discuss recent depression.  In the last 6 months, she has lost a total of 6 friends/family members, 4 of whom to Covid.  She reports that she was in a very dark place and concerned about her own health and suddenly dying.  She reports that over the last several weeks she has been feeling better.  She is concerned that she has not been taking very good care of herself physically.  She is recently resumed taking care of children in her home which has been good for her.  She continues to do work with the [employer] virtually as well as taking up a new hobby of helping.  She reports that she never had any suicidal ideation but was just concerned that she would die unexpectedly.  Her children are in [place] and [place], so she has not been able to see them in over a year.  They do Skype calls frequently.  She is starting to walk more now that the weather is improved.  She does not feel that she needs medication for her symptoms.  She tried talking to a virtual therapist but did not feel that it was a good fit.    Review of Systems No chest pain, some shortness of breath with exertion which she attributes to increased weight deconditioning.  No leg swelling.    Objective:   Physical Exam Vitals reviewed.  Constitutional:      General: She is not in acute distress.    Appearance: Normal appearance. She is obese. She is not ill-appearing, toxic-appearing or diaphoretic.  HENT:     Head: Normocephalic and atraumatic.  Eyes:     Conjunctiva/sclera: Conjunctivae normal.  Cardiovascular:     Rate and Rhythm: Normal rate and regular rhythm.     Heart sounds: Normal heart sounds.  Pulmonary:     Effort: Pulmonary effort is normal.     Breath sounds: Normal breath  sounds.  Musculoskeletal:     Cervical back: Normal range of motion and neck supple.     Right lower leg: No edema.     Left lower leg: No edema.  Skin:    General: Skin is warm and dry.  Neurological:     Mental Status: She is alert and oriented to person, place, and time.  Psychiatric:        Mood and Affect: Mood normal.        Behavior: Behavior normal.        Thought Content: Thought content normal.        Judgment: Judgment normal.       BP 118/78 (BP Location: Left Arm, Patient Position: Sitting, Cuff Size: Normal)   Pulse 68   Temp 98.2 F (36.8 C) (Temporal)   Ht 5\' 1"  (1.549 m)   Wt 227 lb 6.4 oz (103.1 kg)   SpO2 98%   BMI 42.97 kg/m  Wt Readings from Last 3 Encounters:  01/21/20 227 lb 6.4 oz (103.1 kg)  06/16/19 221 lb 6.4 oz (100.4 kg)  03/11/19 225 lb 4 oz (102.2 kg)   Depression screen [facility] 2/9 01/21/2020 01/21/2020 11/18/2018 08/23/2018 07/04/2018  Decreased Interest 1 0 0 0 0  Down, Depressed, Hopeless 1 1 0 0  0  PHQ - 2 Score 2 1 0 0 0  Altered sleeping 1 - - 1 1  Tired, decreased energy 1 - - 1 1  Change in appetite 2 - - 0 0  Feeling bad or failure about yourself  0 - - 0 0  Trouble concentrating 0 - - 0 0  Moving slowly or fidgety/restless 0 - - 0 0  Suicidal thoughts 0 - - 0 0  PHQ-9 Score 6 - - 2 2  Difficult doing work/chores - - - Not difficult at all Not difficult at all       Assessment & Plan:  1. Grief reaction -She feels like she is doing better and does not need any additional therapy or medication at this time.  Discussed important lifestyle considerations such as adequate sleep, nutritious foods, regular exercise especially walking.  2. Prediabetes -Up a few pounds, she wants to work on her diet and we will recheck labs in 3 months at her complete physical  3. Essential hypertension -Well-controlled.  This visit occurred during the SARS-CoV-2 public health emergency.  Safety protocols were in place, including screening questions  prior to the visit, additional usage of staff PPE, and extensive cleaning of exam room while observing appropriate contact time as indicated for disinfecting solutions.      Clarene Reamer, FNP-BC  [facility]  01/21/2020 9:20 AM

## 2020-01-27 ENCOUNTER — Encounter: Payer: Self-pay | Admitting: Family Medicine

## 2020-02-11 ENCOUNTER — Ambulatory Visit: Payer: PPO | Admitting: Internal Medicine

## 2020-03-06 ENCOUNTER — Other Ambulatory Visit: Payer: Self-pay | Admitting: Family Medicine

## 2020-03-06 DIAGNOSIS — E785 Hyperlipidemia, unspecified: Secondary | ICD-10-CM

## 2020-03-19 ENCOUNTER — Other Ambulatory Visit: Payer: Self-pay | Admitting: Internal Medicine

## 2020-03-19 NOTE — Telephone Encounter (Signed)
LMOM to schedule.

## 2020-03-19 NOTE — Telephone Encounter (Signed)
Please schedule overdue office visit. Patient did not show at last appointment. Thank you!

## 2020-04-12 ENCOUNTER — Other Ambulatory Visit: Payer: Self-pay | Admitting: Family Medicine

## 2020-04-12 DIAGNOSIS — E785 Hyperlipidemia, unspecified: Secondary | ICD-10-CM

## 2020-04-22 ENCOUNTER — Other Ambulatory Visit: Payer: Self-pay | Admitting: Family Medicine

## 2020-04-22 DIAGNOSIS — I10 Essential (primary) hypertension: Secondary | ICD-10-CM

## 2020-04-22 DIAGNOSIS — E785 Hyperlipidemia, unspecified: Secondary | ICD-10-CM

## 2020-04-22 DIAGNOSIS — Z79899 Other long term (current) drug therapy: Secondary | ICD-10-CM

## 2020-04-26 ENCOUNTER — Ambulatory Visit (INDEPENDENT_AMBULATORY_CARE_PROVIDER_SITE_OTHER): Payer: PPO

## 2020-04-26 ENCOUNTER — Other Ambulatory Visit (INDEPENDENT_AMBULATORY_CARE_PROVIDER_SITE_OTHER): Payer: PPO

## 2020-04-26 ENCOUNTER — Other Ambulatory Visit: Payer: Self-pay

## 2020-04-26 ENCOUNTER — Ambulatory Visit
Admission: RE | Admit: 2020-04-26 | Discharge: 2020-04-26 | Disposition: A | Discharge status: Home / Self Care | Payer: PPO | Source: Ambulatory Visit | Attending: Family Medicine | Admitting: Family Medicine

## 2020-04-26 DIAGNOSIS — N6310 Unspecified lump in the right breast, unspecified quadrant: Secondary | ICD-10-CM

## 2020-04-26 DIAGNOSIS — E785 Hyperlipidemia, unspecified: Secondary | ICD-10-CM

## 2020-04-26 DIAGNOSIS — I10 Essential (primary) hypertension: Secondary | ICD-10-CM | POA: Diagnosis not present

## 2020-04-26 DIAGNOSIS — Z79899 Other long term (current) drug therapy: Secondary | ICD-10-CM | POA: Diagnosis not present

## 2020-04-26 DIAGNOSIS — Z0000 Encounter for general adult medical examination without abnormal findings: Secondary | ICD-10-CM

## 2020-04-26 DIAGNOSIS — R928 Other abnormal and inconclusive findings on diagnostic imaging of breast: Secondary | ICD-10-CM | POA: Diagnosis not present

## 2020-04-26 LAB — HEMOGLOBIN A1C: Hgb A1c MFr Bld: 6.5 % (ref 4.6–6.5)

## 2020-04-26 LAB — COMPREHENSIVE METABOLIC PANEL
ALT: 19 U/L (ref 0–35)
AST: 21 U/L (ref 0–37)
Albumin: 4.5 g/dL (ref 3.5–5.2)
Alkaline Phosphatase: 82 U/L (ref 39–117)
BUN: 17 mg/dL (ref 6–23)
CO2: 29 mEq/L (ref 19–32)
Calcium: 9.4 mg/dL (ref 8.4–10.5)
Chloride: 102 mEq/L (ref 96–112)
Creatinine, Ser: 0.82 mg/dL (ref 0.40–1.20)
GFR: 69.05 mL/min (ref >60.00)
Glucose, Bld: 166 mg/dL — ABNORMAL HIGH (ref 70–99)
Potassium: 5 mEq/L (ref 3.5–5.1)
Sodium: 136 mEq/L (ref 135–145)
Total Bilirubin: 0.9 mg/dL (ref 0.2–1.2)
Total Protein: 7.5 g/dL (ref 6.0–8.3)

## 2020-04-26 LAB — CBC WITH DIFFERENTIAL/PLATELET
Basophils Absolute: 0 10*3/uL (ref 0.0–0.1)
Basophils Relative: 0.8 % (ref 0.0–3.0)
Eosinophils Absolute: 0.1 10*3/uL (ref 0.0–0.7)
Eosinophils Relative: 2.3 % (ref 0.0–5.0)
HCT: 39.9 % (ref 36.0–46.0)
Hemoglobin: 13.7 g/dL (ref 12.0–15.0)
Lymphocytes Relative: 23.4 % (ref 12.0–46.0)
Lymphs Abs: 1.2 10*3/uL (ref 0.7–4.0)
MCHC: 34.4 g/dL (ref 30.0–36.0)
MCV: 85.7 fl (ref 78.0–100.0)
Monocytes Absolute: 0.6 10*3/uL (ref 0.1–1.0)
Monocytes Relative: 11.8 % (ref 3.0–12.0)
Neutro Abs: 3 10*3/uL (ref 1.4–7.7)
Neutrophils Relative %: 61.7 % (ref 43.0–77.0)
Platelets: 225 10*3/uL (ref 150.0–400.0)
RBC: 4.65 Mil/uL (ref 3.87–5.11)
RDW: 15.2 % (ref 11.5–15.5)
WBC: 4.9 10*3/uL (ref 4.0–10.5)

## 2020-04-26 LAB — LIPID PANEL
Cholesterol: 118 mg/dL (ref 0–200)
HDL: 30.8 mg/dL — ABNORMAL LOW (ref >39.00)
LDL Cholesterol: 61 mg/dL (ref 0–99)
NonHDL: 87.03
Total CHOL/HDL Ratio: 4
Triglycerides: 131 mg/dL (ref 0.0–149.0)
VLDL: 26.2 mg/dL (ref 0.0–40.0)

## 2020-04-26 NOTE — Patient Instructions (Signed)
Lindsey Pierce , Thank you for taking time to come for your Medicare Wellness Visit. I appreciate your ongoing commitment to your health goals. Please review the following plan we discussed and let me know if I can assist you in the future.   Screening recommendations/referrals: Colonoscopy: due Mammogram: scheduled 04/26/2020 Bone Density: Up to date, completed 12/20/2018 Recommended yearly ophthalmology/optometry visit for glaucoma screening and checkup Recommended yearly dental visit for hygiene and checkup  Vaccinations: Influenza vaccine: Fall 2021 Pneumococcal vaccine: due Tdap vaccine: decline Shingles vaccine: discussed    Advanced directives: Advance directive discussed with you today. Even though you declined this today please call our office should you change your mind and we can give you the proper paperwork for you to fill out.   Conditions/risks identified: hypertension, hyperlipidemia  Next appointment: 04/28/2020 @ 8:30 am    Preventive Care 65 Years and Older, Female Preventive care refers to lifestyle choices and visits with your health care provider that can promote health and wellness. What does preventive care include?  A yearly physical exam. This is also called an annual well check.  Dental exams once or twice a year.  Routine eye exams. Ask your health care provider how often you should have your eyes checked.  Personal lifestyle choices, including:  Daily care of your teeth and gums.  Regular physical activity.  Eating a healthy diet.  Avoiding tobacco and drug use.  Limiting alcohol use.  Practicing safe sex.  Taking low-dose aspirin every day.  Taking vitamin and mineral supplements as recommended by your health care provider. What happens during an annual well check? The services and screenings done by your health care provider during your annual well check will depend on your age, overall health, lifestyle risk factors, and family history of  disease. Counseling  Your health care provider may ask you questions about your:  Alcohol use.  Tobacco use.  Drug use.  Emotional well-being.  Home and relationship well-being.  Sexual activity.  Eating habits.  History of falls.  Memory and ability to understand (cognition).  Work and work Statistician.  Reproductive health. Screening  You may have the following tests or measurements:  Height, weight, and BMI.  Blood pressure.  Lipid and cholesterol levels. These may be checked every 5 years, or more frequently if you are over 41 years old.  Skin check.  Lung cancer screening. You may have this screening every year starting at age 48 if you have a 30-pack-year history of smoking and currently smoke or have quit within the past 15 years.  Fecal occult blood test (FOBT) of the stool. You may have this test every year starting at age 23.  Flexible sigmoidoscopy or colonoscopy. You may have a sigmoidoscopy every 5 years or a colonoscopy every 10 years starting at age 22.  Hepatitis C blood test.  Hepatitis B blood test.  Sexually transmitted disease (STD) testing.  Diabetes screening. This is done by checking your blood sugar (glucose) after you have not eaten for a while (fasting). You may have this done every 1-3 years.  Bone density scan. This is done to screen for osteoporosis. You may have this done starting at age 39.  Mammogram. This may be done every 1-2 years. Talk to your health care provider about how often you should have regular mammograms. Talk with your health care provider about your test results, treatment options, and if necessary, the need for more tests. Vaccines  Your health care provider may recommend certain vaccines,  such as:  Influenza vaccine. This is recommended every year.  Tetanus, diphtheria, and acellular pertussis (Tdap, Td) vaccine. You may need a Td booster every 10 years.  Zoster vaccine. You may need this after age  36.  Pneumococcal 13-valent conjugate (PCV13) vaccine. One dose is recommended after age 64.  Pneumococcal polysaccharide (PPSV23) vaccine. One dose is recommended after age 89. Talk to your health care provider about which screenings and vaccines you need and how often you need them. This information is not intended to replace advice given to you by your health care provider. Make sure you discuss any questions you have with your health care provider. Document Released: 11/26/2015 Document Revised: 07/19/2016 Document Reviewed: 08/31/2015 Elsevier Interactive Patient Education  2017 [place] Prevention in the Home Falls can cause injuries. They can happen to people of all ages. There are many things you can do to make your home safe and to help prevent falls. What can I do on the outside of my home?  Regularly fix the edges of walkways and driveways and fix any cracks.  Remove anything that might make you trip as you walk through a door, such as a raised step or threshold.  Trim any bushes or trees on the path to your home.  Use bright outdoor lighting.  Clear any walking paths of anything that might make someone trip, such as rocks or tools.  Regularly check to see if handrails are loose or broken. Make sure that both sides of any steps have handrails.  Any raised decks and porches should have guardrails on the edges.  Have any leaves, snow, or ice cleared regularly.  Use sand or salt on walking paths during winter.  Clean up any spills in your garage right away. This includes oil or grease spills. What can I do in the bathroom?  Use night lights.  Install grab bars by the toilet and in the tub and shower. Do not use towel bars as grab bars.  Use non-skid mats or decals in the tub or shower.  If you need to sit down in the shower, use a plastic, non-slip stool.  Keep the floor dry. Clean up any water that spills on the floor as soon as it happens.  Remove  soap buildup in the tub or shower regularly.  Attach bath mats securely with double-sided non-slip rug tape.  Do not have throw rugs and other things on the floor that can make you trip. What can I do in the bedroom?  Use night lights.  Make sure that you have a light by your bed that is easy to reach.  Do not use any sheets or blankets that are too big for your bed. They should not hang down onto the floor.  Have a firm chair that has side arms. You can use this for support while you get dressed.  Do not have throw rugs and other things on the floor that can make you trip. What can I do in the kitchen?  Clean up any spills right away.  Avoid walking on wet floors.  Keep items that you use a lot in easy-to-reach places.  If you need to reach something above you, use a strong step stool that has a grab bar.  Keep electrical cords out of the way.  Do not use floor polish or wax that makes floors slippery. If you must use wax, use non-skid floor wax.  Do not have throw rugs and other things on  the floor that can make you trip. What can I do with my stairs?  Do not leave any items on the stairs.  Make sure that there are handrails on both sides of the stairs and use them. Fix handrails that are broken or loose. Make sure that handrails are as long as the stairways.  Check any carpeting to make sure that it is firmly attached to the stairs. Fix any carpet that is loose or worn.  Avoid having throw rugs at the top or bottom of the stairs. If you do have throw rugs, attach them to the floor with carpet tape.  Make sure that you have a light switch at the top of the stairs and the bottom of the stairs. If you do not have them, ask someone to add them for you. What else can I do to help prevent falls?  Wear shoes that:  Do not have high heels.  Have rubber bottoms.  Are comfortable and fit you well.  Are closed at the toe. Do not wear sandals.  If you use a  stepladder:  Make sure that it is fully opened. Do not climb a closed stepladder.  Make sure that both sides of the stepladder are locked into place.  Ask someone to hold it for you, if possible.  Clearly mark and make sure that you can see:  Any grab bars or handrails.  First and last steps.  Where the edge of each step is.  Use tools that help you move around (mobility aids) if they are needed. These include:  Canes.  Walkers.  Scooters.  Crutches.  Turn on the lights when you go into a dark area. Replace any light bulbs as soon as they burn out.  Set up your furniture so you have a clear path. Avoid moving your furniture around.  If any of your floors are uneven, fix them.  If there are any pets around you, be aware of where they are.  Review your medicines with your doctor. Some medicines can make you feel dizzy. This can increase your chance of falling. Ask your doctor what other things that you can do to help prevent falls. This information is not intended to replace advice given to you by your health care provider. Make sure you discuss any questions you have with your health care provider. Document Released: 08/26/2009 Document Revised: 04/06/2016 Document Reviewed: 12/04/2014 Elsevier Interactive Patient Education  2017 [employer].

## 2020-04-26 NOTE — Progress Notes (Signed)
PCP notes:  Health Maintenance: Colonoscopy- due, will do Cologuard Pneumoccocal 23- due Tdap- insurance/financial   Abnormal Screenings: none   Patient concerns: Patient wants to discuss medications that need to be stopped in order for her to have some teeth pulled.    Nurse concerns: none   Next PCP appt: 04/28/2020 @ 8:30 am

## 2020-04-26 NOTE — Progress Notes (Signed)
Subjective:   Lindsey Pierce is a 69 y.o. female who presents for an Initial Medicare Annual Wellness Visit.  Review of Systems: N/A      I connected with the patient today by telephone and verified that I am speaking with the correct person using two identifiers. Location patient: home Location nurse: work Persons participating in the virtual visit: patient, Marine scientist.   I discussed the limitations, risks, security and privacy concerns of performing an evaluation and management service by telephone and the availability of in person appointments. I also discussed with the patient that there may be a patient responsible charge related to this service. The patient expressed understanding and verbally consented to this telephonic visit.    Interactive audio and video telecommunications were attempted between this nurse and patient, however failed, due to patient having technical difficulties OR patient did not have access to video capability.  We continued and completed visit with audio only.      Cardiac Risk Factors include: advanced age (>34men, >21 women);hypertension;dyslipidemia     Objective:    Today's Vitals   There is no height or weight on file to calculate BMI.  Advanced Directives 04/26/2020 07/04/2018 06/02/2018  Does Patient Have a Medical Advance Directive? No Yes No  Type of Advance Directive - [place] -  Does patient want to make changes to medical advance directive? - No - Patient declined -  Copy of [place] in Chart? - No - copy requested -  Would patient like information on creating a medical advance directive? No - Patient declined - No - Patient declined    Current Medications (verified) Outpatient Encounter Medications as of 04/26/2020  Medication Sig  . aspirin EC 81 MG tablet Take 81 mg by mouth daily.  Marland Kitchen atorvastatin (LIPITOR) 80 MG tablet TAKE 1 TABLET BY MOUTH EVERY DAY AT 6PM  . carvedilol (COREG) 6.25 MG tablet  TAKE 1 TABLET BY MOUTH TWICE A DAY  . losartan (COZAAR) 25 MG tablet Take 1 tablet (25 mg total) by mouth daily.  . Multiple Vitamin (MULTIVITAMIN WITH MINERALS) TABS Take 1 tablet by mouth daily.  . nitroGLYCERIN (NITROSTAT) 0.4 MG SL tablet Place 1 tablet (0.4 mg total) under the tongue every 5 (five) minutes as needed.  . ticagrelor (BRILINTA) 90 MG TABS tablet Take 1 tablet (90 mg total) by mouth 2 (two) times daily.   No facility-administered encounter medications on file as of 04/26/2020.    Allergies (verified) Codeine, Darvon [propoxyphene hcl], and Penicillins   History: Past Medical History:  Diagnosis Date  . CAD (coronary artery disease)    a. 05/2018 Ant STEMI/PCI: LM nl, LAD 62m(2.75 x 16 Synergy DES), D2 99 (PTCA), RI 40ost, LCX mild diff dzs, OM1/2/3 nl, RCA large, diffuse dzs, RPDA nl, RPAV 60, RPLB1 80 (small).  . Diabetes ([place])   . HFrEF (heart failure with reduced ejection fraction) ([place])    a. 05/2018 Echo: EF 40-45%, mod LVH, Gr2 DD, mid antsept, apical septal, apical AK. Mid-apical ant, mid infsept HK. Nl RV fxn. Triv TR. b. Echo 08/2018: mod-sev. LVH, EF 60-65%, Grade 1 DD, AV-mild calcification, mild MR, mild dialation As. Aorta.  . Hyperlipidemia   . Hypertension   . Ischemic cardiomyopathy    a. 05/2018 Echo: EF 40-45%. b. 08/2018 Echo: EF 60-65%  . PAF (paroxysmal atrial fibrillation) ([place])    a. 05/2018 at time of STEMI, converted on Amio. No OAC.  Marland Kitchen STEMI involving left anterior descending  coronary artery ([place])    a. 03/7845 - complicated by CGS req IABP and VT req amiodarone.  . V-tach ([place])    a. 05/2018 at time of Ant STEMI-->Amio.   Past Surgical History:  Procedure Laterality Date  . CORONARY/GRAFT ACUTE MI REVASCULARIZATION N/A 05/31/2018   Procedure: Coronary/Graft Acute MI Revascularization;  Surgeon: [name], MD;  Location: [facility];  Service: Cardiovascular;  Laterality: N/A;  . IABP INSERTION N/A 05/31/2018   Procedure: IABP  Insertion;  Surgeon: [name], MD;  Location: [facility];  Service: Cardiovascular;  Laterality: N/A;  . LEFT HEART CATH AND CORONARY ANGIOGRAPHY N/A 05/31/2018   Procedure: LEFT HEART CATH AND CORONARY ANGIOGRAPHY;  Surgeon: [name], MD;  Location: [facility];  Service: Cardiovascular;  Laterality: N/A;   Family History  Problem Relation Age of Onset  . Valvular heart disease Mother    Social History   Socioeconomic History  . Marital status: Divorced    Spouse name: Not on file  . Number of children: Not on file  . Years of education: Not on file  . Highest education level: Not on file  Occupational History  . Not on file  Tobacco Use  . Smoking status: Never Smoker  . Smokeless tobacco: Never Used  Substance and Sexual Activity  . Alcohol use: No  . Drug use: No  . Sexual activity: Not on file  Other Topics Concern  . Not on file  Social History Narrative  . Not on file   Social Determinants of Health   Financial Resource Strain: Low Risk   . Difficulty of Paying Living Expenses: Not hard at all  Food Insecurity: No Food Insecurity  . Worried About Charity fundraiser in the Last Year: Never true  . Ran Out of Food in the Last Year: Never true  Transportation Needs: No Transportation Needs  . Lack of Transportation (Medical): No  . Lack of Transportation (Non-Medical): No  Physical Activity: Sufficiently Active  . Days of Exercise per Week: 7 days  . Minutes of Exercise per Session: 30 min  Stress: No Stress Concern Present  . Feeling of Stress : Not at all  Social Connections:   . Frequency of Communication with Friends and Family:   . Frequency of Social Gatherings with Friends and Family:   . Attends Religious Services:   . Active Member of Clubs or Organizations:   . Attends Archivist Meetings:   Marland Kitchen Marital Status:     Tobacco Counseling Counseling given: Not Answered   Clinical Intake:  Pre-visit preparation  completed: Yes  Pain : No/denies pain     Nutritional Risks: None Diabetes: No  How often do you need to have someone help you when you read instructions, pamphlets, or other written materials from your doctor or pharmacy?: 1 - Never What is the last grade level you completed in school?: college graduate  Interpreter Needed?: No  Information entered by :: CJohnson, LPN   Activities of Daily Living In your present state of health, do you have any difficulty performing the following activities: 04/26/2020  Hearing? N  Vision? N  Difficulty concentrating or making decisions? N  Walking or climbing stairs? N  Dressing or bathing? N  Doing errands, shopping? N  Preparing Food and eating ? N  Using the Toilet? N  In the past six months, have you accidently leaked urine? N  Do you have problems with loss of bowel control?  N  Managing your Medications? N  Managing your Finances? N  Housekeeping or managing your Housekeeping? N  Some recent data might be hidden     Immunizations and Health Maintenance Immunization History  Administered Date(s) Administered  . Influenza, High Dose Seasonal PF 08/24/2018  . Influenza-Unspecified 08/24/2018  . Pneumococcal Conjugate-13 11/18/2018   Health Maintenance Due  Topic Date Due  . COVID-19 Vaccine (1) Never done  . COLONOSCOPY  Never done  . PNA vac Low Risk Adult (2 of 2 - PPSV23) 11/19/2019    Patient Care Team: Elby Beck, FNP as PCP - General (Nurse Practitioner) End, Harrell Gave, MD as PCP - Cardiology (Cardiology)  Indicate any recent Medical Services you may have received from other than [facility] providers in the past year (date may be approximate).     Assessment:   This is a routine wellness examination for Larcenia.  Hearing/Vision screen  Hearing Screening   125Hz  250Hz  500Hz  1000Hz  2000Hz  3000Hz  4000Hz  6000Hz  8000Hz   Right ear:           Left ear:           Vision Screening Comments: Patient gets annual eye  exams   Dietary issues and exercise activities discussed: Current Exercise Habits: Home exercise routine, Type of exercise: walking, Time (Minutes): 30, Frequency (Times/Week): 7, Weekly Exercise (Minutes/Week): 210, Intensity: Moderate, Exercise limited by: None identified  Goals    . Patient Stated     04/26/2020, I will continue walking 8,000 steps a day.       Depression Screen PHQ 2/9 Scores 04/26/2020 01/21/2020 01/21/2020 11/18/2018 08/23/2018 07/04/2018  PHQ - 2 Score 0 2 1 0 0 0  PHQ- 9 Score 0 6 - - 2 2    Fall Risk Fall Risk  04/26/2020 11/18/2018 07/04/2018  Falls in the past year? 1 0 No  Comment fell off ladder - -  Number falls in past yr: 0 - -  Injury with Fall? 0 - -  Risk for fall due to : Medication side effect - -  Follow up Falls evaluation completed;Falls prevention discussed - -    Is the patient's home free of loose throw rugs in walkways, pet beds, electrical cords, etc?   yes      Grab bars in the bathroom? no      Handrails on the stairs?   yes      Adequate lighting?   yes  Timed Get Up and Go Performed: N/A  Cognitive Function: MMSE - Mini Mental State Exam 04/26/2020  Not completed: Refused       Mini Cog  Mini-Cog screen was completed. Maximum score is 22. A value of 0 denotes this part of the MMSE was not completed or the patient failed this part of the Mini-Cog screening.  Screening Tests Health Maintenance  Topic Date Due  . COVID-19 Vaccine (1) Never done  . COLONOSCOPY  Never done  . PNA vac Low Risk Adult (2 of 2 - PPSV23) 11/19/2019  . TETANUS/TDAP  04/26/2024 (Originally 12/18/1969)  . INFLUENZA VACCINE  06/13/2020  . MAMMOGRAM  12/20/2020  . DEXA SCAN  Completed  . Hepatitis C Screening  Completed    Qualifies for Shingles Vaccine: Yes   Cancer Screenings: Lung: Low Dose CT Chest recommended if Age 92-80 years, 30 pack-year currently smoking OR have quit w/in 15 years. Patient does not qualify. Breast: Up to date on Mammogram:  scheduled 04/26/2020   Up to date of Bone Density/Dexa: Yes,  completed 12/20/2018 Colorectal: due, will do Cologuard  Additional Screenings:  Hepatitis C Screening: 12/20/2018     Plan:   Patient will continue to walk 8,000 steps a day.  I have personally reviewed and noted the following in the patient's chart:   . Medical and social history . Use of alcohol, tobacco or illicit drugs  . Current medications and supplements . Functional ability and status . Nutritional status . Physical activity . Advanced directives . List of other physicians . Hospitalizations, surgeries, and ER visits in previous 12 months . Vitals . Screenings to include cognitive, depression, and falls . Referrals and appointments  In addition, I have reviewed and discussed with patient certain preventive protocols, quality metrics, and best practice recommendations. A written personalized care plan for preventive services as well as general preventive health recommendations were provided to patient.     Andrez Grime, LPN   [id number]

## 2020-04-27 ENCOUNTER — Other Ambulatory Visit: Payer: Self-pay

## 2020-04-27 ENCOUNTER — Ambulatory Visit (INDEPENDENT_AMBULATORY_CARE_PROVIDER_SITE_OTHER): Payer: PPO | Admitting: Family

## 2020-04-27 ENCOUNTER — Encounter: Payer: Self-pay | Admitting: Family

## 2020-04-27 VITALS — BP 120/72 | HR 77 | Ht 60.0 in | Wt 223.2 lb

## 2020-04-27 DIAGNOSIS — E785 Hyperlipidemia, unspecified: Secondary | ICD-10-CM

## 2020-04-27 DIAGNOSIS — I2510 Atherosclerotic heart disease of native coronary artery without angina pectoris: Secondary | ICD-10-CM

## 2020-04-27 DIAGNOSIS — I480 Paroxysmal atrial fibrillation: Secondary | ICD-10-CM | POA: Diagnosis not present

## 2020-04-27 MED ORDER — TICAGRELOR 60 MG PO TABS: 60.0000 mg | ORAL_TABLET | Freq: Two times a day (BID) | ORAL | 3 refills | Status: DC

## 2020-04-27 MED ORDER — CARVEDILOL 6.25 MG PO TABS: 6.2500 mg | ORAL_TABLET | Freq: Two times a day (BID) | ORAL | 3 refills | Status: DC

## 2020-04-27 NOTE — Patient Instructions (Signed)
Medication Instructions:    Decrease Brilinta to 60 mg take one tablet twice a day. You may keep taking the Brilinta 90 mg until you have finished the bottle and then start the Brilinta 60 mg.   *If you need a refill on your cardiac medications before your next appointment, please call your pharmacy*   Lab Work: No labs ordered   If you have labs (blood work) drawn today and your tests are completely normal, you will receive your results only by: Marland Kitchen MyChart Message (if you have MyChart) OR . A paper copy in the mail If you have any lab test that is abnormal or we need to change your treatment, we will call you to review the results.   Testing/Procedures: NONE    Follow-Up: At [facility], you and your health needs are our priority.  As part of our continuing mission to provide you with exceptional heart care, we have created designated Provider Care Teams.  These Care Teams include your primary Cardiologist (physician) and Advanced Practice Providers (APPs -  Physician Assistants and Nurse Practitioners) who all work together to provide you with the care you need, when you need it.  We recommend signing up for the patient portal called "MyChart".  Sign up information is provided on this After Visit Summary.  MyChart is used to connect with patients for Virtual Visits (Telemedicine).  Patients are able to view lab/test results, encounter notes, upcoming appointments, etc.  Non-urgent messages can be sent to your provider as well.   To learn more about what you can do with MyChart, go to [email or web address].    Your next appointment:   12 month(s)  The format for your next appointment:   In Person  Provider:   [name], MD

## 2020-04-27 NOTE — Progress Notes (Signed)
Office Visit    Patient Name: Lindsey Pierce Date of Encounter: 04/27/2020  Primary Care Provider:  Elby Beck, FNP Primary Cardiologist:  [name], MD Electrophysiologist:  None   Chief Complaint    Lindsey Pierce is a 69 y.o. female with a hx of CAD with anterior STEMI complicated by cardiogenic shock (LVEF 40-45% at time of MI; 73 to 65% on repeat echo 08/2018), atrial fibrillation, and sustained VT in the immediate post MI.,  HTN, HLD presents today for follow-up of CAD  Past Medical History    Past Medical History:  Diagnosis Date   CAD (coronary artery disease)    a. 05/2018 Ant STEMI/PCI: LM nl, LAD 63m(2.75 x 16 Synergy DES), D2 99 (PTCA), RI 40ost, LCX mild diff dzs, OM1/2/3 nl, RCA large, diffuse dzs, RPDA nl, RPAV 60, RPLB1 80 (small).   Diabetes ([place])    HFrEF (heart failure with reduced ejection fraction) ([place])    a. 05/2018 Echo: EF 40-45%, mod LVH, Gr2 DD, mid antsept, apical septal, apical AK. Mid-apical ant, mid infsept HK. Nl RV fxn. Triv TR. b. Echo 08/2018: mod-sev. LVH, EF 60-65%, Grade 1 DD, AV-mild calcification, mild MR, mild dialation As. Aorta.   Hyperlipidemia    Hypertension    Ischemic cardiomyopathy    a. 05/2018 Echo: EF 40-45%. b. 08/2018 Echo: EF 60-65%   PAF (paroxysmal atrial fibrillation) ([place])    a. 05/2018 at time of STEMI, converted on Amio. No OAC.   STEMI involving left anterior descending coronary artery ([place])    a. 07/239 - complicated by CGS req IABP and VT req amiodarone.   V-tach [facility])    a. 05/2018 at time of Ant STEMI-->Amio.   Past Surgical History:  Procedure Laterality Date   CARDIAC CATHETERIZATION     CORONARY/GRAFT ACUTE MI REVASCULARIZATION N/A 05/31/2018   Procedure: Coronary/Graft Acute MI Revascularization;  Surgeon: [name], MD;  Location: [place] [facility];  Service: Cardiovascular;  Laterality: N/A;   IABP INSERTION N/A 05/31/2018   Procedure: IABP Insertion;  Surgeon: [name], MD;  Location: [place] [facility];  Service: Cardiovascular;  Laterality: N/A;   LEFT HEART CATH AND CORONARY ANGIOGRAPHY N/A 05/31/2018   Procedure: LEFT HEART CATH AND CORONARY ANGIOGRAPHY;  Surgeon: [name], MD;  Location: [place] [facility];  Service: Cardiovascular;  Laterality: N/A;    Allergies  Allergies  Allergen Reactions   Codeine Swelling    Mouth and tongue swelling (couldn't swallow)   Darvon [Propoxyphene Hcl] Swelling    Mouth and tongue swelling (couldn't swallow)   Penicillins Hives    Has patient had a PCN reaction causing immediate rash, facial/tongue/throat swelling, SOB or lightheadedness with hypotension: Yes Has patient had a PCN reaction causing severe rash involving mucus membranes or skin necrosis: No Has patient had a PCN reaction that required hospitalization: No Has patient had a PCN reaction occurring within the last 10 years: No If all of the above answers are "NO", then may proceed with Cephalosporin use.    History of Present Illness    Lindsey Pierce is a 69 y.o. female with a hx of CAD with anterior STEMI complicated by cardiogenic shock (LVEF 40-45% at time of MI; 60 to 65% on repeat echo 08/2018), atrial fibrillation, and sustained VT in the immediate post MI.,  HTN, HLD.  She was last seen 02/2019 by Dr. Saunders Revel.  She has had no recurrent atrial fibrillation or VT post MI and thus her  anticoagulation and amiodarone have been subsequently discontinued.  She reports feeling overall well.  She enjoys volunteering in the community with her [employer].  She has 2 children who live in [place] in [place].  Does endorse that the pandemic is very difficult for her as she enjoys being very social but things have been improving.  She has resumed a walking regimen with plans to work on weight loss.  Reports no shortness of breath at rest.  Endorses some dyspnea on exertion with more than usual activity that is improving which she  attributes to excess weight.. Reports no chest pain, pressure, or tightness. No edema, orthopnea, PND. Reports no palpitations.  She reports no melena no hematuria.  EKGs/Labs/Other Studies Reviewed:   The following studies were reviewed today:  EKG:  EKG is ordered today.  The ekg ordered today demonstrates SR 77 bpm with left axis deviation and stable anteroseptal and inferior Q waves.   Recent Labs: 04/26/2020: ALT 19; BUN 17; Creatinine, Ser 0.82; Hemoglobin 13.7; Platelets 225.0; Potassium 5.0; Sodium 136  Recent Lipid Panel    Component Value Date/Time   CHOL 118 04/26/2020 0742   TRIG 131.0 04/26/2020 0742   HDL 30.80 (L) 04/26/2020 0742   CHOLHDL 4 04/26/2020 0742   VLDL 26.2 04/26/2020 0742   LDLCALC 61 04/26/2020 0742    Home Medications   Current Meds  Medication Sig   aspirin EC 81 MG tablet Take 81 mg by mouth daily.   atorvastatin (LIPITOR) 80 MG tablet TAKE 1 TABLET BY MOUTH EVERY DAY AT 6PM   carvedilol (COREG) 6.25 MG tablet Take 1 tablet (6.25 mg total) by mouth 2 (two) times daily.   losartan (COZAAR) 25 MG tablet Take 1 tablet (25 mg total) by mouth daily.   Multiple Vitamin (MULTIVITAMIN WITH MINERALS) TABS Take 1 tablet by mouth daily.   nitroGLYCERIN (NITROSTAT) 0.4 MG SL tablet Place 1 tablet (0.4 mg total) under the tongue every 5 (five) minutes as needed.   [DISCONTINUED] carvedilol (COREG) 6.25 MG tablet TAKE 1 TABLET BY MOUTH TWICE A DAY   [DISCONTINUED] ticagrelor (BRILINTA) 90 MG TABS tablet Take 1 tablet (90 mg total) by mouth 2 (two) times daily.      Review of Systems      Review of Systems  Constitutional: Negative for chills, fever and malaise/fatigue.  Cardiovascular: Negative for chest pain, dyspnea on exertion, leg swelling, near-syncope, orthopnea, palpitations and syncope.  Respiratory: Negative for cough, shortness of breath and wheezing.   Gastrointestinal: Negative for nausea and vomiting.  Neurological: Negative for  dizziness, light-headedness and weakness.   All other systems reviewed and are otherwise negative except as noted above.  Physical Exam    VS:  BP 120/72    Pulse 77    Ht 5' (1.524 m)    Wt 223 lb 4 oz (101.3 kg)    SpO2 97%    BMI 43.60 kg/m  , BMI Body mass index is 43.6 kg/m. GEN: Well nourished, overweight, well developed, in no acute distress. HEENT: normal. Neck: Supple, no JVD, carotid bruits, or masses. Cardiac: RRR, no murmurs, rubs, or gallops. No clubbing, cyanosis, edema.  Radials/DP/PT 2+ and equal bilaterally.  Respiratory:  Respirations regular and unlabored, clear to auscultation bilaterally. GI: Soft, nontender, nondistended, BS + x 4. MS: No deformity or atrophy. Skin: Warm and dry, no rash. Neuro:  Strength and sensation are intact. Psych: Normal affect.  Assessment & Plan    1. CAD -stable with no anginal  symptoms.  EKG with no acute ST/T wave changes.  Reports no lightheadedness, shortness of breath, chest pain.  GDMT includes aspirin, beta-blocker, statin.  As she is greater than 1 year out from her STEMI will reduce her Brilinta to 60 mg twice daily.  She reports no bleeding complications. 2. HTN -BP well controlled.  Continue present antihypertensive regimen. 3. PAF/sustained VT -the current in the setting of STEMI.  Anticoagulation amiodarone has subsequently been discontinued as there has been no recurrence.  No symptoms today suggestive of arrhythmia.  No indication for event monitor at this time. 4. Morbid obesity -weight gain in setting pandemic.  Recently started walking regimen encouraged to continue.  She tells me she is making dietary changes as well and encouraged to continue. 5. HLD, LDL goal less than 70 -recent LDL 61.  Continue atorvastatin.  Disposition: Follow up in 1 year(s) with Dr. Saunders Revel or APP.   Loel Dubonnet, NP 04/27/2020, 8:17 PM

## 2020-04-28 ENCOUNTER — Encounter: Payer: Self-pay | Admitting: Family Medicine

## 2020-04-28 ENCOUNTER — Other Ambulatory Visit: Payer: Self-pay

## 2020-04-28 ENCOUNTER — Ambulatory Visit (INDEPENDENT_AMBULATORY_CARE_PROVIDER_SITE_OTHER): Payer: PPO | Admitting: Family Medicine

## 2020-04-28 VITALS — BP 138/82 | HR 66 | Temp 97.7°F | Ht 61.0 in | Wt 224.0 lb

## 2020-04-28 DIAGNOSIS — Z23 Encounter for immunization: Secondary | ICD-10-CM

## 2020-04-28 DIAGNOSIS — R739 Hyperglycemia, unspecified: Secondary | ICD-10-CM

## 2020-04-28 DIAGNOSIS — Z0000 Encounter for general adult medical examination without abnormal findings: Secondary | ICD-10-CM | POA: Diagnosis not present

## 2020-04-28 DIAGNOSIS — L819 Disorder of pigmentation, unspecified: Secondary | ICD-10-CM | POA: Diagnosis not present

## 2020-04-28 NOTE — Progress Notes (Signed)
Subjective:    Patient ID: Lindsey Pierce, female    DOB: 08-Feb-1951, 69 y.o.   MRN: [id number]  HPI Chief Complaint  Patient presents with  . Annual Exam    Mammogram done 6/14, labs done 6/14 and cardiac workup 6/15   This is a 69 yo female who presents today for CPE.    Last CPE- Mammo- 06/2019 Pap- 11/18/2018 Colonoscopy- has cologuard kit at home, plans to collect this week Tdap- advised her to have at her local pharmacy Flu- annual Eye- regular Dental-upcoming Exercise- walking in and out of house  Diabetes- has been working with her niece who is a Engineer, maintenance (IT). Started with purging white things, increasing steps, water.    Review of Systems  Constitutional: Negative.   HENT: Negative.   Eyes: Negative.   Respiratory: Negative.   Cardiovascular: Negative.   Gastrointestinal: Negative.   Endocrine: Negative.   Genitourinary: Negative.   Musculoskeletal: Negative.   Skin:       Skin lesions on face, getting larger, darker.   Hematological: Negative.   Psychiatric/Behavioral: Negative.        Objective:   Physical Exam Vitals reviewed.  Constitutional:      General: She is not in acute distress.    Appearance: Normal appearance. She is obese. She is not ill-appearing, toxic-appearing or diaphoretic.  HENT:     Head: Normocephalic and atraumatic.     Right Ear: Tympanic membrane, ear canal and external ear normal.     Left Ear: Tympanic membrane, ear canal and external ear normal.  Eyes:     Conjunctiva/sclera: Conjunctivae normal.  Cardiovascular:     Rate and Rhythm: Normal rate and regular rhythm.     Pulses: Normal pulses.     Heart sounds: Normal heart sounds.  Pulmonary:     Effort: Pulmonary effort is normal.     Breath sounds: Normal breath sounds.  Chest:     Breasts:        Right: Normal.        Left: Normal.  Abdominal:     General: Abdomen is flat. Bowel sounds are normal. There is no distension.     Palpations: There is no mass.      Tenderness: There is no abdominal tenderness. There is no guarding or rebound.     Hernia: A hernia (right ventral, no change) is present.  Musculoskeletal:        General: Normal range of motion.  Skin:    General: Skin is warm and dry.     Comments: Multiple raised, hyperpigmented lesions on face and back.   Neurological:     Mental Status: She is alert and oriented to person, place, and time.  Psychiatric:        Mood and Affect: Mood normal.        Behavior: Behavior normal.        Thought Content: Thought content normal.        Judgment: Judgment normal.       BP 138/82 (BP Location: Left Arm, Patient Position: Sitting, Cuff Size: Normal)   Pulse 66   Temp 97.7 F (36.5 C) (Temporal)   Ht 5' 1"  (1.549 m)   Wt 224 lb (101.6 kg)   SpO2 96%   BMI 42.32 kg/m  Wt Readings from Last 3 Encounters:  04/28/20 224 lb (101.6 kg)  04/27/20 223 lb 4 oz (101.3 kg)  01/21/20 227 lb 6.4 oz (103.1 kg)   Depression screen [facility] 2/9  04/26/2020 01/21/2020 01/21/2020 11/18/2018 08/23/2018  Decreased Interest 0 1 0 0 0  Down, Depressed, Hopeless 0 1 1 0 0  PHQ - 2 Score 0 2 1 0 0  Altered sleeping 0 1 - - 1  Tired, decreased energy 0 1 - - 1  Change in appetite 0 2 - - 0  Feeling bad or failure about yourself  0 0 - - 0  Trouble concentrating 0 0 - - 0  Moving slowly or fidgety/restless 0 0 - - 0  Suicidal thoughts 0 0 - - 0  PHQ-9 Score 0 6 - - 2  Difficult doing work/chores Not difficult at all - - - Not difficult at all        Assessment & Plan:  1. Annual physical exam - reviewed health maintenance and recommendations made  2. Need for pneumococcal vaccination - Pneumococcal polysaccharide vaccine 23-valent greater than or equal to 2yo subcutaneous/IM  3. Change in pigmented skin lesion of face - Ambulatory referral to Dermatology  4. Elevated blood sugar - she is motivated to exercise and lose weight, talked about carbs, sugars, processed foods, will recheck in 6  months  This visit occurred during the SARS-CoV-2 public health emergency.  Safety protocols were in place, including screening questions prior to the visit, additional usage of staff PPE, and extensive cleaning of exam room while observing appropriate contact time as indicated for disinfecting solutions.      Clarene Reamer, FNP-BC  [facility] at [facility]  04/28/2020 5:45 PM

## 2020-04-28 NOTE — Patient Instructions (Signed)
Great to see you today!  Keep up the good work with your diet and exercise  Follow up in 6 months  Have your dentist send me your preop paperwork  A resource that I like is www.dietdoctor.com/diabetes/diet  Here are some guidelines to help you with meal planning -  Avoid all processed and packaged foods (bread, pasta, crackers, chips, etc) and beverages containing calories.  Avoid added sugars and excessive natural sugars.  Attention to how you feel if you consume artificial sweeteners.  Do they make you more hungry or raise your blood sugar?  With every meal and snack, aim to get 20 g of protein (3 ounces of meat, 4 ounces of fish, 3 eggs, protein powder, 1 cup [place] yogurt, 1 cup cottage cheese, etc.)  Increase fiber in the form of non-starchy vegetables.  These help you feel full with very little carbohydrates and are good for gut health.  Eat 1 serving healthy carb per meal- 1/2 cup brown rice, beans, potato, corn- pay attention to whether or not this significantly raises your blood sugar. If it does, reduce the frequency you consume these.   Eat 2-3 servings of lower sugar fruits daily.  This includes berries, apples, oranges, peaches, pears, one half banana.  Have small amounts of good fats such as avocado, nuts, olive oil, nut butters, olives.  Add a little cheese to your salads to make them tasty.

## 2020-04-30 NOTE — Progress Notes (Signed)
I reviewed health advisor's note, was available for consultation, and agree with documentation and plan.  

## 2020-05-11 ENCOUNTER — Encounter: Payer: Self-pay | Admitting: Family Medicine

## 2020-05-11 DIAGNOSIS — D045 Carcinoma in situ of skin of trunk: Secondary | ICD-10-CM | POA: Diagnosis not present

## 2020-05-11 DIAGNOSIS — L700 Acne vulgaris: Secondary | ICD-10-CM | POA: Diagnosis not present

## 2020-05-11 DIAGNOSIS — D225 Melanocytic nevi of trunk: Secondary | ICD-10-CM | POA: Diagnosis not present

## 2020-05-18 ENCOUNTER — Other Ambulatory Visit: Payer: Self-pay | Admitting: Family Medicine

## 2020-05-18 DIAGNOSIS — E785 Hyperlipidemia, unspecified: Secondary | ICD-10-CM

## 2020-06-02 ENCOUNTER — Encounter: Payer: Self-pay | Admitting: Family Medicine

## 2020-06-16 DIAGNOSIS — C44529 Squamous cell carcinoma of skin of other part of trunk: Secondary | ICD-10-CM | POA: Diagnosis not present

## 2020-08-19 ENCOUNTER — Other Ambulatory Visit: Payer: Self-pay | Admitting: Family Medicine

## 2020-08-19 DIAGNOSIS — E785 Hyperlipidemia, unspecified: Secondary | ICD-10-CM

## 2020-10-25 ENCOUNTER — Ambulatory Visit: Payer: PPO | Admitting: Family Medicine

## 2020-11-05 ENCOUNTER — Encounter: Payer: Self-pay | Admitting: Family Medicine

## 2020-11-15 ENCOUNTER — Other Ambulatory Visit: Payer: PPO

## 2020-11-16 ENCOUNTER — Other Ambulatory Visit: Payer: PPO

## 2020-11-16 DIAGNOSIS — Z20822 Contact with and (suspected) exposure to covid-19: Secondary | ICD-10-CM

## 2020-11-18 LAB — NOVEL CORONAVIRUS, NAA: SARS-CoV-2, NAA: NOT DETECTED

## 2020-11-18 LAB — SARS-COV-2, NAA 2 DAY TAT

## 2021-01-17 ENCOUNTER — Other Ambulatory Visit: Payer: Self-pay

## 2021-01-17 NOTE — Telephone Encounter (Signed)
Pharmacy requests refill on: Losartan Potassium 25 mg   LAST REFILL: 01/21/2020 (Q-90, R-3) LAST OV: 04/28/2020 NEXT OV: 03/21/2021 PHARMACY: [id number] [place], [place]

## 2021-01-19 MED ORDER — LOSARTAN POTASSIUM 25 MG PO TABS: 25.0000 mg | ORAL_TABLET | Freq: Every day | ORAL | 0 refills | Status: DC

## 2021-01-19 NOTE — Telephone Encounter (Signed)
ERx 

## 2021-02-19 DIAGNOSIS — Z20822 Contact with and (suspected) exposure to covid-19: Secondary | ICD-10-CM | POA: Diagnosis not present

## 2021-03-21 ENCOUNTER — Ambulatory Visit: Payer: PPO | Admitting: Internal Medicine

## 2021-04-19 ENCOUNTER — Other Ambulatory Visit: Payer: Self-pay | Admitting: Family Medicine

## 2021-04-20 ENCOUNTER — Other Ambulatory Visit: Payer: Self-pay | Admitting: Family

## 2021-04-20 MED ORDER — LOSARTAN POTASSIUM 25 MG PO TABS: 25.0000 mg | ORAL_TABLET | Freq: Every day | ORAL | 0 refills | Status: DC

## 2021-04-20 NOTE — Telephone Encounter (Signed)
Losartan Last filled:  01/19/21, #90 Last OV:  04/28/20, AWV prt 2 Next OV:  05/19/21, CPE w/ Allie Bossier

## 2021-04-22 ENCOUNTER — Other Ambulatory Visit: Payer: Self-pay | Admitting: *Deleted

## 2021-04-22 DIAGNOSIS — E785 Hyperlipidemia, unspecified: Secondary | ICD-10-CM

## 2021-04-22 NOTE — Telephone Encounter (Signed)
Last office visit 04/28/2020 for CPE with D. Carlean Purl.  Last refilled 09/08/2020 for #90 with 2 refills.  CPE scheduled with KCarlis Abbott 05/19/21 but no TOC appointment.

## 2021-04-24 MED ORDER — ATORVASTATIN CALCIUM 80 MG PO TABS: ORAL_TABLET | ORAL | 0 refills | Status: DC

## 2021-04-27 ENCOUNTER — Ambulatory Visit: Payer: PPO

## 2021-04-28 ENCOUNTER — Encounter: Payer: PPO | Admitting: Internal Medicine

## 2021-05-02 ENCOUNTER — Ambulatory Visit: Payer: PPO | Admitting: Physician Assistant

## 2021-05-12 ENCOUNTER — Other Ambulatory Visit: Payer: PPO

## 2021-05-12 ENCOUNTER — Encounter: Payer: Self-pay | Admitting: Primary Care

## 2021-05-12 ENCOUNTER — Telehealth (INDEPENDENT_AMBULATORY_CARE_PROVIDER_SITE_OTHER): Payer: PPO | Admitting: Primary Care

## 2021-05-12 DIAGNOSIS — U071 COVID-19: Secondary | ICD-10-CM | POA: Diagnosis not present

## 2021-05-12 MED ORDER — MOLNUPIRAVIR EUA 200MG CAPSULE: 4.0000 | ORAL_CAPSULE | Freq: Two times a day (BID) | ORAL | 0 refills | Status: AC

## 2021-05-12 NOTE — Assessment & Plan Note (Addendum)
Symptom onset 3 days ago, COVID-positive yesterday and today with home test.  She will send [place] a picture of her positive result.  We discussed treatment options as she qualifies for oral COVID treatment.  No history of kidney disease, last several GFR's are within normal range.  She is managed on Brilinta so we will proceed with sending molnupiravir to her pharmacy, discussed directions for use.  We also discussed that she needs to start this no later than tomorrow afternoon if she decides to pursue treatment.  We will also see her in a week for her physical if symptoms have improved.

## 2021-05-12 NOTE — Progress Notes (Signed)
Patient ID: Lindsey Pierce, female    DOB: Dec 19, 1950, 70 y.o.   MRN: [id number]  Virtual visit completed through [place], a video enabled telemedicine application. Due to national recommendations of social distancing due to COVID-19, a virtual visit is felt to be most appropriate for this patient at this time. Reviewed limitations, risks, security and privacy concerns of performing a virtual visit and the availability of in person appointments. I also reviewed that there may be a patient responsible charge related to this service. The patient agreed to proceed.   Patient location: home Provider location: [place] at [facility], office Persons participating in this virtual visit: patient, provider   If any vitals were documented, they were collected by patient at home unless specified below.    BP 106/72   Pulse 71   Temp 97.8 F (36.6 C) (Temporal)   Ht 5\' 1"  (1.549 m)   Wt 224 lb (101.6 kg)   BMI 42.32 kg/m    CC: Positive Covid Subjective:   HPI: Lindsey Pierce is a 70 y.o. female patient of Tor Netters with a history of hypertension, paroxysmal atrial fibrillation, CAD, prediabetes presenting on 05/12/2021 to discuss Covid-Positive symptoms.    Symptoms began on three days ago with a runny nose. Now she has a cough, fatigue. [place] to symptom onset. She tested positive yesterday and today with home tests.   She's had three Covid vaccines.  Today she feels about the same as she did yesterday, no worse.  She is interested in [place] treatment if symptoms do not improve by tomorrow.  She denies a history of kidney disease.  Last GFR on file was from June 2021 which was 32.      Relevant past medical, surgical, family and social history reviewed and updated as indicated. Interim medical history since our last visit reviewed. Allergies and medications reviewed and updated. Outpatient Medications Prior to Visit  Medication Sig  Dispense Refill   aspirin EC 81 MG tablet Take 81 mg by mouth daily.     atorvastatin (LIPITOR) 80 MG tablet TAKE 1 TABLET BY MOUTH EVERY DAY AT 6PM 90 tablet 0   carvedilol (COREG) 6.25 MG tablet Take 1 tablet (6.25 mg total) by mouth 2 (two) times daily. 180 tablet 3   losartan (COZAAR) 25 MG tablet TAKE 1 TABLET (25 MG TOTAL) BY MOUTH DAILY. 90 tablet 0   Multiple Vitamin (MULTIVITAMIN WITH MINERALS) TABS Take 1 tablet by mouth daily.     ticagrelor (BRILINTA) 60 MG TABS tablet Take 1 tablet (60 mg total) by mouth 2 (two) times daily. 180 tablet 3   nitroGLYCERIN (NITROSTAT) 0.4 MG SL tablet Place 1 tablet (0.4 mg total) under the tongue every 5 (five) minutes as needed. (Patient not taking: Reported on 05/12/2021) 25 tablet 2   losartan (COZAAR) 25 MG tablet Take 1 tablet (25 mg total) by mouth daily. 90 tablet 0   No facility-administered medications prior to visit.     Per HPI unless specifically indicated in ROS section below Review of Systems  Constitutional:  Positive for fatigue. Negative for chills and fever.  HENT:  Positive for congestion.   Respiratory:  Positive for cough.   Neurological:  Negative for headaches.  Objective:  BP 106/72   Pulse 71   Temp 97.8 F (36.6 C) (Temporal)   Ht 5\' 1"  (1.549 m)   Wt 224 lb (101.6 kg)   BMI 42.32 kg/m  Wt Readings from Last 3 Encounters:  05/12/21 224 lb (101.6 kg)  04/28/20 224 lb (101.6 kg)  04/27/20 223 lb 4 oz (101.3 kg)       Physical exam: Gen: alert, NAD, not ill appearing Pulm: speaks in complete sentences without increased work of breathing, coughed once during visit. Psych: normal mood, normal thought content      Results for orders placed or performed in visit on 11/16/20  Novel Coronavirus, NAA ([facility])   Specimen: Nasopharyngeal(NP) swabs in vial transport medium   Nasopharynge  Screenin  Result Value Ref Range   SARS-CoV-2, NAA Not Detected Not Detected  SARS-COV-2, NAA 2 DAY TAT   Nasopharynge   Screenin  Result Value Ref Range   SARS-CoV-2, NAA 2 DAY TAT Performed    Assessment & Plan:   Problem List Items Addressed This Visit       Other   COVID-19 virus infection    Symptom onset 3 days ago, COVID-positive yesterday and today with home test.  She will send [place] a picture of her positive result.  We discussed treatment options as she qualifies for oral COVID treatment.  No history of kidney disease, last several GFR's are within normal range.  She is managed on Brilinta so we will proceed with sending molnupiravir to her pharmacy, discussed directions for use.  We also discussed that she needs to start this no later than tomorrow afternoon if she decides to pursue treatment.  We will also see her in a week for her physical if symptoms have improved.         No orders of the defined types were placed in this encounter.  No orders of the defined types were placed in this encounter.   I discussed the assessment and treatment plan with the patient. The patient was provided an opportunity to ask questions and all were answered. The patient agreed with the plan and demonstrated an understanding of the instructions. The patient was advised to call back or seek an in-person evaluation if the symptoms worsen or if the condition fails to improve as anticipated.  Follow up plan: No follow-ups on file.  Pleas Koch, NP

## 2021-05-19 ENCOUNTER — Ambulatory Visit (INDEPENDENT_AMBULATORY_CARE_PROVIDER_SITE_OTHER): Payer: PPO | Admitting: Primary Care

## 2021-05-19 ENCOUNTER — Encounter: Payer: Self-pay | Admitting: Primary Care

## 2021-05-19 ENCOUNTER — Other Ambulatory Visit: Payer: Self-pay | Admitting: Family

## 2021-05-19 ENCOUNTER — Other Ambulatory Visit: Payer: Self-pay

## 2021-05-19 VITALS — BP 118/74 | HR 64 | Temp 97.6°F | Ht 61.0 in | Wt 219.0 lb

## 2021-05-19 DIAGNOSIS — Z1231 Encounter for screening mammogram for malignant neoplasm of breast: Secondary | ICD-10-CM | POA: Diagnosis not present

## 2021-05-19 DIAGNOSIS — I480 Paroxysmal atrial fibrillation: Secondary | ICD-10-CM | POA: Diagnosis not present

## 2021-05-19 DIAGNOSIS — Z23 Encounter for immunization: Secondary | ICD-10-CM

## 2021-05-19 DIAGNOSIS — Z0000 Encounter for general adult medical examination without abnormal findings: Secondary | ICD-10-CM | POA: Insufficient documentation

## 2021-05-19 DIAGNOSIS — E2839 Other primary ovarian failure: Secondary | ICD-10-CM | POA: Diagnosis not present

## 2021-05-19 DIAGNOSIS — Z1211 Encounter for screening for malignant neoplasm of colon: Secondary | ICD-10-CM

## 2021-05-19 DIAGNOSIS — E119 Type 2 diabetes mellitus without complications: Secondary | ICD-10-CM | POA: Diagnosis not present

## 2021-05-19 DIAGNOSIS — I255 Ischemic cardiomyopathy: Secondary | ICD-10-CM

## 2021-05-19 DIAGNOSIS — I2510 Atherosclerotic heart disease of native coronary artery without angina pectoris: Secondary | ICD-10-CM

## 2021-05-19 DIAGNOSIS — I10 Essential (primary) hypertension: Secondary | ICD-10-CM

## 2021-05-19 DIAGNOSIS — E785 Hyperlipidemia, unspecified: Secondary | ICD-10-CM | POA: Diagnosis not present

## 2021-05-19 LAB — COMPREHENSIVE METABOLIC PANEL WITH GFR
ALT: 19 U/L (ref 0–35)
AST: 21 U/L (ref 0–37)
Albumin: 4.6 g/dL (ref 3.5–5.2)
Alkaline Phosphatase: 79 U/L (ref 39–117)
BUN: 34 mg/dL — ABNORMAL HIGH (ref 6–23)
CO2: 25 meq/L (ref 19–32)
Calcium: 9.5 mg/dL (ref 8.4–10.5)
Chloride: 103 meq/L (ref 96–112)
Creatinine, Ser: 1.01 mg/dL (ref 0.40–1.20)
GFR: 56.44 mL/min — ABNORMAL LOW
Glucose, Bld: 135 mg/dL — ABNORMAL HIGH (ref 70–99)
Potassium: 4.2 meq/L (ref 3.5–5.1)
Sodium: 138 meq/L (ref 135–145)
Total Bilirubin: 1 mg/dL (ref 0.2–1.2)
Total Protein: 8.1 g/dL (ref 6.0–8.3)

## 2021-05-19 LAB — HEMOGLOBIN A1C: Hgb A1c MFr Bld: 6.7 % — ABNORMAL HIGH (ref 4.6–6.5)

## 2021-05-19 LAB — LIPID PANEL
Cholesterol: 114 mg/dL (ref 0–200)
HDL: 29.3 mg/dL — ABNORMAL LOW
LDL Cholesterol: 59 mg/dL (ref 0–99)
NonHDL: 84.92
Total CHOL/HDL Ratio: 4
Triglycerides: 128 mg/dL (ref 0.0–149.0)
VLDL: 25.6 mg/dL (ref 0.0–40.0)

## 2021-05-19 LAB — CBC
HCT: 42.5 % (ref 36.0–46.0)
Hemoglobin: 14.5 g/dL (ref 12.0–15.0)
MCHC: 34.2 g/dL (ref 30.0–36.0)
MCV: 84.8 fl (ref 78.0–100.0)
Platelets: 254 K/uL (ref 150.0–400.0)
RBC: 5.01 Mil/uL (ref 3.87–5.11)
RDW: 15 % (ref 11.5–15.5)
WBC: 5.2 K/uL (ref 4.0–10.5)

## 2021-05-19 MED ORDER — ZOSTER VAC RECOMB ADJUVANTED 50 MCG/0.5ML IM SUSR: 0.5000 mL | Freq: Once | INTRAMUSCULAR | 1 refills | Status: AC

## 2021-05-19 NOTE — Assessment & Plan Note (Signed)
Asymptomatic. Continue Brilinta 60 mg, carvedilol 6.25 mg BID.  Continue lipid control, diabetes control.

## 2021-05-19 NOTE — Assessment & Plan Note (Signed)
Well controlled in the office today, continue losartan 25 mg and carvedilol 6.25 mg BID.  Labs pending.

## 2021-05-19 NOTE — Assessment & Plan Note (Signed)
Follows with cardiology. Continue Brilinta 60 mg, carvedilol 6.25 mg BID, BP control, diabetes control, lipid control.

## 2021-05-19 NOTE — Assessment & Plan Note (Signed)
Rate and rhythm regular today, on Brilinta 60 mg, otherwise no regular anticoagulant use.  Continue carvedilol 6.25 mg BID and Brilinta 60 mg.

## 2021-05-19 NOTE — Assessment & Plan Note (Signed)
Compliant to atorvastatin 80 mg, continue same. Repeat lipid panel pending.

## 2021-05-19 NOTE — Progress Notes (Signed)
Subjective:    Patient ID: Lindsey Pierce, female    DOB: June 29, 1951, 70 y.o.   MRN: [id number]  HPI  Lindsey Pierce is a very pleasant 70 y.o. female patient of Tor Netters who presents today for complete physical.  Immunizations: -Influenza: Due this season  -Covid-19: 3 vaccines  -Shingles: Never completed  -Pneumonia: Prevnar 13 in 2020, Pneumovax in 2021   Diet: [place].  Exercise: She is walking 10,000-12,000 steps daily   Eye exam: Completes annually  Dental exam: Completes semi-annually   Mammogram: Completed in June 2021 Dexa: Completed in February 2020 Colonoscopy: Completed Cologuard over 3 years ago. Due  BP Readings from Last 3 Encounters:  05/19/21 118/74  05/12/21 106/72  04/28/20 138/82   Wt Readings from Last 3 Encounters:  05/19/21 219 lb (99.3 kg)  05/12/21 224 lb (101.6 kg)  04/28/20 224 lb (101.6 kg)      Review of Systems  Constitutional:  Negative for unexpected weight change.  HENT:  Negative for rhinorrhea.   Respiratory:  Negative for shortness of breath.   Cardiovascular:  Negative for chest pain.  Gastrointestinal:  Negative for constipation and diarrhea.  Genitourinary:  Negative for difficulty urinating.  Musculoskeletal:  Negative for arthralgias and myalgias.  Skin:  Negative for rash.  Allergic/Immunologic: Negative for environmental allergies.  Neurological:  Negative for dizziness, numbness and headaches.  Psychiatric/Behavioral:  The patient is not nervous/anxious.         Past Medical History:  Diagnosis Date   CAD (coronary artery disease)    a. 05/2018 Ant STEMI/PCI: LM nl, LAD 73m(2.75 x 16 Synergy DES), D2 99 (PTCA), RI 40ost, LCX mild diff dzs, OM1/2/3 nl, RCA large, diffuse dzs, RPDA nl, RPAV 60, RPLB1 80 (small).   Diabetes ([place])    HFrEF (heart failure with reduced ejection fraction) ([place])    a. 05/2018 Echo: EF 40-45%, mod LVH, Gr2 DD, mid antsept, apical septal, apical AK. Mid-apical ant, mid  infsept HK. Nl RV fxn. Triv TR. b. Echo 08/2018: mod-sev. LVH, EF 60-65%, Grade 1 DD, AV-mild calcification, mild MR, mild dialation As. Aorta.   Hyperlipidemia    Hypertension    Ischemic cardiomyopathy    a. 05/2018 Echo: EF 40-45%. b. 08/2018 Echo: EF 60-65%   PAF (paroxysmal atrial fibrillation) ([place])    a. 05/2018 at time of STEMI, converted on Amio. No OAC.   STEMI involving left anterior descending coronary artery ([place])    a. 03/4655 - complicated by CGS req IABP and VT req amiodarone.   V-tach [facility])    a. 05/2018 at time of Ant STEMI-->Amio.    Social History   Socioeconomic History   Marital status: Divorced    Spouse name: Not on file   Number of children: Not on file   Years of education: Not on file   Highest education level: Not on file  Occupational History   Not on file  Tobacco Use   Smoking status: Never   Smokeless tobacco: Never  Vaping Use   Vaping Use: Never used  Substance and Sexual Activity   Alcohol use: No   Drug use: No   Sexual activity: Not on file  Other Topics Concern   Not on file  Social History Narrative   Not on file   Social Determinants of Health   Financial Resource Strain: Not on file  Food Insecurity: Not on file  Transportation Needs: Not on file  Physical Activity: Not on file  Stress: Not  on file  Social Connections: Not on file  Intimate Partner Violence: Not on file    Past Surgical History:  Procedure Laterality Date   CARDIAC CATHETERIZATION     CORONARY/GRAFT ACUTE MI REVASCULARIZATION N/A 05/31/2018   Procedure: Coronary/Graft Acute MI Revascularization;  Surgeon: [name], MD;  Location: [place] [facility];  Service: Cardiovascular;  Laterality: N/A;   IABP INSERTION N/A 05/31/2018   Procedure: IABP Insertion;  Surgeon: [name], MD;  Location: [place] [facility];  Service: Cardiovascular;  Laterality: N/A;   LEFT HEART CATH AND CORONARY ANGIOGRAPHY N/A 05/31/2018   Procedure: LEFT HEART CATH AND  CORONARY ANGIOGRAPHY;  Surgeon: [name], MD;  Location: [place] [facility];  Service: Cardiovascular;  Laterality: N/A;    Family History  Problem Relation Age of Onset   Valvular heart disease Mother     Allergies  Allergen Reactions   Codeine Swelling    Mouth and tongue swelling (couldn't swallow)   Darvon [Propoxyphene Hcl] Swelling    Mouth and tongue swelling (couldn't swallow)   Penicillins Hives    Has patient had a PCN reaction causing immediate rash, facial/tongue/throat swelling, SOB or lightheadedness with hypotension: Yes Has patient had a PCN reaction causing severe rash involving mucus membranes or skin necrosis: No Has patient had a PCN reaction that required hospitalization: No Has patient had a PCN reaction occurring within the last 10 years: No If all of the above answers are "NO", then may proceed with Cephalosporin use.    Current Outpatient Medications on File Prior to Visit  Medication Sig Dispense Refill   aspirin EC 81 MG tablet Take 81 mg by mouth daily.     atorvastatin (LIPITOR) 80 MG tablet TAKE 1 TABLET BY MOUTH EVERY DAY AT 6PM 90 tablet 0   carvedilol (COREG) 6.25 MG tablet Take 1 tablet (6.25 mg total) by mouth 2 (two) times daily. 180 tablet 3   losartan (COZAAR) 25 MG tablet TAKE 1 TABLET (25 MG TOTAL) BY MOUTH DAILY. 90 tablet 0   Multiple Vitamin (MULTIVITAMIN WITH MINERALS) TABS Take 1 tablet by mouth daily.     ticagrelor (BRILINTA) 60 MG TABS tablet Take 1 tablet (60 mg total) by mouth 2 (two) times daily. 180 tablet 3   nitroGLYCERIN (NITROSTAT) 0.4 MG SL tablet Place 1 tablet (0.4 mg total) under the tongue every 5 (five) minutes as needed. (Patient not taking: Reported on 05/19/2021) 25 tablet 2   No current facility-administered medications on file prior to visit.    BP 118/74   Pulse 64   Temp 97.6 F (36.4 C) (Temporal)   Ht 5\' 1"  (1.549 m)   Wt 219 lb (99.3 kg)   SpO2 97%   BMI 41.38 kg/m  Objective:   Physical  Exam HENT:     Right Ear: Tympanic membrane and ear canal normal.     Left Ear: Tympanic membrane and ear canal normal.     Nose: Nose normal.  Eyes:     Conjunctiva/sclera: Conjunctivae normal.     Pupils: Pupils are equal, round, and reactive to light.  Neck:     Thyroid: No thyromegaly.  Cardiovascular:     Rate and Rhythm: Normal rate and regular rhythm.     Heart sounds: No murmur heard. Pulmonary:     Effort: Pulmonary effort is normal.     Breath sounds: Normal breath sounds. No rales.  Abdominal:     General: Bowel sounds are normal.  Palpations: Abdomen is soft.     Tenderness: There is no abdominal tenderness.  Musculoskeletal:        General: Normal range of motion.     Cervical back: Neck supple.  Lymphadenopathy:     Cervical: No cervical adenopathy.  Skin:    General: Skin is warm and dry.     Findings: No rash.  Neurological:     Mental Status: She is alert and oriented to person, place, and time.     Cranial Nerves: No cranial nerve deficit.     Deep Tendon Reflexes: Reflexes are normal and symmetric.  Psychiatric:        Mood and Affect: Mood normal.          Assessment & Plan:      This visit occurred during the SARS-CoV-2 public health emergency.  Safety protocols were in place, including screening questions prior to the visit, additional usage of staff PPE, and extensive cleaning of exam room while observing appropriate contact time as indicated for disinfecting solutions.

## 2021-05-19 NOTE — Assessment & Plan Note (Signed)
Rx for Shingrix provided. Other vaccines UTD.  Diagnostic mammogram due, ordered. Colon cancer screening due, she opts for Cologuard. Bone density screening due, orders placed.  Discussed the importance of a healthy diet and regular exercise in order for weight loss, and to reduce the risk of further co-morbidity.  Exam today stable. Labs pending.

## 2021-05-19 NOTE — Assessment & Plan Note (Signed)
>>  ASSESSMENT AND PLAN FOR PREVENTATIVE HEALTH CARE WRITTEN ON 05/19/2021  8:32 AM BY CLARK, KATHERINE K, NP  Rx for Shingrix provided. Other vaccines UTD.  Diagnostic mammogram due, ordered. Colon cancer screening due, she opts for Cologuard. Bone density screening due, orders placed.  Discussed the importance of a healthy diet and regular exercise in order for weight loss, and to reduce the risk of further co-morbidity.  Exam today stable. Labs pending.

## 2021-05-19 NOTE — Patient Instructions (Addendum)
Stop by the lab prior to leaving today. I will notify you of your results once received.   You will be contacted regarding your mammogram and bone density scan.  Please let us know if you have not been contacted within two weeks.   It was a pleasure to see you today!     Health Maintenance Due  Topic Date Due   Zoster Vaccines- Shingrix (1 of 2) Never done would like to talk about this today    Fecal DNA (Cologuard)  Never done  order started     Recommended follow up: No follow-ups on file.    For information on Brilinta you can go to the following web site  [email or web address]

## 2021-05-19 NOTE — Assessment & Plan Note (Addendum)
Noted from prior labs, including one year ago with A1C of 6.5.  Not on treatment.   Checking A1C today. Managed on statin and ARB. Discussed annual eye exams.

## 2021-05-20 ENCOUNTER — Telehealth: Payer: Self-pay | Admitting: Internal Medicine

## 2021-05-20 MED ORDER — TICAGRELOR 60 MG PO TABS: 60.0000 mg | ORAL_TABLET | Freq: Two times a day (BID) | ORAL | 1 refills | Status: DC

## 2021-05-20 NOTE — Telephone Encounter (Signed)
Requested Prescriptions   Signed Prescriptions Disp Refills   ticagrelor (BRILINTA) 60 MG TABS tablet 60 tablet 1    Sig: Take 1 tablet (60 mg total) by mouth 2 (two) times daily.    Authorizing Provider: END, CHRISTOPHER    Ordering User: Raelene Bott, Dareon Nunziato L

## 2021-05-20 NOTE — Telephone Encounter (Signed)
*  STAT* If patient is at the pharmacy, call can be transferred to refill team.   1. Which medications need to be refilled? (please list name of each medication and dose if known) Brilinta 60mg , 1 tablet daily  2. Which pharmacy/location (including street and city if local pharmacy) [facility] [place]  3. Do they need a 30 day or 90 day supply? [place]

## 2021-05-23 ENCOUNTER — Other Ambulatory Visit: Payer: Self-pay | Admitting: Primary Care

## 2021-05-23 DIAGNOSIS — Z09 Encounter for follow-up examination after completed treatment for conditions other than malignant neoplasm: Secondary | ICD-10-CM

## 2021-06-02 ENCOUNTER — Other Ambulatory Visit: Payer: Self-pay | Admitting: Primary Care

## 2021-06-02 DIAGNOSIS — Z1211 Encounter for screening for malignant neoplasm of colon: Secondary | ICD-10-CM

## 2021-06-20 ENCOUNTER — Other Ambulatory Visit: Payer: Self-pay

## 2021-06-20 ENCOUNTER — Encounter: Payer: Self-pay | Admitting: Physician Assistant

## 2021-06-20 ENCOUNTER — Other Ambulatory Visit: Payer: Self-pay | Admitting: Family

## 2021-06-20 ENCOUNTER — Ambulatory Visit: Payer: PPO | Admitting: Physician Assistant

## 2021-06-20 VITALS — BP 124/78 | HR 64 | Ht 60.0 in | Wt 209.0 lb

## 2021-06-20 DIAGNOSIS — I252 Old myocardial infarction: Secondary | ICD-10-CM | POA: Diagnosis not present

## 2021-06-20 DIAGNOSIS — E785 Hyperlipidemia, unspecified: Secondary | ICD-10-CM | POA: Diagnosis not present

## 2021-06-20 DIAGNOSIS — I10 Essential (primary) hypertension: Secondary | ICD-10-CM

## 2021-06-20 DIAGNOSIS — I2510 Atherosclerotic heart disease of native coronary artery without angina pectoris: Secondary | ICD-10-CM | POA: Diagnosis not present

## 2021-06-20 DIAGNOSIS — Z8679 Personal history of other diseases of the circulatory system: Secondary | ICD-10-CM | POA: Diagnosis not present

## 2021-06-20 DIAGNOSIS — Z8616 Personal history of COVID-19: Secondary | ICD-10-CM

## 2021-06-20 DIAGNOSIS — Z87898 Personal history of other specified conditions: Secondary | ICD-10-CM | POA: Diagnosis not present

## 2021-06-20 MED ORDER — LOSARTAN POTASSIUM 25 MG PO TABS: 25.0000 mg | ORAL_TABLET | Freq: Every day | ORAL | 1 refills | Status: DC

## 2021-06-20 MED ORDER — TICAGRELOR 60 MG PO TABS: 60.0000 mg | ORAL_TABLET | Freq: Two times a day (BID) | ORAL | 5 refills | Status: DC

## 2021-06-20 MED ORDER — ATORVASTATIN CALCIUM 80 MG PO TABS: ORAL_TABLET | ORAL | 1 refills | Status: DC

## 2021-06-20 MED ORDER — CARVEDILOL 6.25 MG PO TABS: 6.2500 mg | ORAL_TABLET | Freq: Two times a day (BID) | ORAL | 1 refills | Status: DC

## 2021-06-20 NOTE — Patient Instructions (Signed)
Medication Instructions:  Refills have been sent in.  Medication Samples have been provided to the patient.  Drug name: Brilinta        Strength: 60 mg         Qty: 4 boxes   LOT: [id number]   Exp.Date: 11/2021  *If you need a refill on your cardiac medications before your next appointment, please call your pharmacy*   Lab Work: None  If you have labs (blood work) drawn today and your tests are completely normal, you will receive your results only by: [place] (if you have MyChart) OR A paper copy in the mail If you have any lab test that is abnormal or we need to change your treatment, we will call you to review the results.   Testing/Procedures: None   Follow-Up: At [facility], you and your health needs are our priority.  As part of our continuing mission to provide you with exceptional heart care, we have created designated Provider Care Teams.  These Care Teams include your primary Cardiologist (physician) and Advanced Practice Providers (APPs -  Physician Assistants and Nurse Practitioners) who all work together to provide you with the care you need, when you need it.   Your next appointment:   1 year(s)  The format for your next appointment:   In Person  Provider:   You may see [name], MD or one of the following Advanced Practice Providers on your designated Care Team:   Murray Hodgkins, NP Christell Faith, PA-C Marrianne Mood, PA-C Cadence Colusa, [place]

## 2021-06-20 NOTE — Progress Notes (Signed)
Office Visit    Patient Name: Lindsey Pierce Date of Encounter: 06/20/2021  PCP:  Elby Beck, [place]  Cardiologist:  [name], MD  Advanced Practice Provider:  No care team member to display Electrophysiologist:  None [phone]   Chief Complaint    Chief Complaint  Patient presents with   Follow-up    70 y.o. female with history of CAD with anterior STEMI complicated by cardiogenic shock (LVEF 40 to 45% at time of MI; 59 to 65% on repeat echo 08/2018), atrial fibrillation, sustained VT in immediate post MI, hypertension, hyperlipidemia, and who presents today for 1 year follow-up.  Past Medical History    Past Medical History:  Diagnosis Date   CAD (coronary artery disease)    a. 05/2018 Ant STEMI/PCI: LM nl, LAD 9m2.75 x 16 Synergy DES), D2 99 (PTCA), RI 40ost, LCX mild diff dzs, OM1/2/3 nl, RCA large, diffuse dzs, RPDA nl, RPAV 60, RPLB1 80 (small).   Diabetes ([facility]    HFrEF (heart failure with reduced ejection fraction) ([facility]    a. 05/2018 Echo: EF 40-45%, mod LVH, Gr2 DD, mid antsept, apical septal, apical AK. Mid-apical ant, mid infsept HK. Nl RV fxn. Triv TR. b. Echo 08/2018: mod-sev. LVH, EF 60-65%, Grade 1 DD, AV-mild calcification, mild MR, mild dialation As. Aorta.   Hyperlipidemia    Hypertension    Ischemic cardiomyopathy    a. 05/2018 Echo: EF 40-45%. b. 08/2018 Echo: EF 60-65%   PAF (paroxysmal atrial fibrillation) ([facility]    a. 05/2018 at time of STEMI, converted on Amio. No OAC.   STEMI involving left anterior descending coronary artery ([facility]    a. [id number] complicated by CGS req IABP and VT req amiodarone.   V-tach ([facility]    a. 05/2018 at time of Ant STEMI-->Amio.   Past Surgical History:  Procedure Laterality Date   CARDIAC CATHETERIZATION     CORONARY/GRAFT ACUTE MI REVASCULARIZATION N/A 05/31/2018   Procedure: Coronary/Graft Acute MI Revascularization;  Surgeon: ENelva Bush MD;  [facility];  Service: Cardiovascular;  Laterality: N/A;   IABP INSERTION N/A 05/31/2018   Procedure: IABP Insertion;  Surgeon: ENelva Bush MD;  [facility];  Service: Cardiovascular;  Laterality: N/A;   LEFT HEART CATH AND CORONARY ANGIOGRAPHY N/A 05/31/2018   Procedure: LEFT HEART CATH AND CORONARY ANGIOGRAPHY;  Surgeon: ENelva Bush MD;  [facility];  Service: Cardiovascular;  Laterality: N/A;    Allergies  Allergies  Allergen Reactions   Codeine Swelling    Mouth and tongue swelling (couldn't swallow)   Darvon [Propoxyphene Hcl] Swelling    Mouth and tongue swelling (couldn't swallow)   Penicillins Hives    Has patient had a PCN reaction causing immediate rash, facial/tongue/throat swelling, SOB or lightheadedness with hypotension: Yes Has patient had a PCN reaction causing severe rash involving mucus membranes or skin necrosis: No Has patient had a PCN reaction that required hospitalization: No Has patient had a PCN reaction occurring within the last 10 years: No If all of the above answers are "NO", then may proceed with Cephalosporin use.    History of Present Illness    Lindsey Pierce a 70y.o. female with PMH as above.  She has history of CAD with anterior STEMI complicated by cardiogenic shock, atrial fibrillation, sustained VT in the immediate post MI, hypertension, and hyperlipidemia.  Prior to her MI, she reports having  compromised vision while driving, after which time she pulled over and called 911.  She denies ever experiencing any chest pain or shortness of breath.  After she had no recurrent atrial fibrillation or VT post MI, her anticoagulation and amiodarone were subsequently discontinued.  She was last seen by Laurann Montana, NP, 04/27/2020 and feeling well.  She was volunteering at the [employer].  She has 2 children who live in [place] and [place].  She reported that the pandemic is very difficult.  She had resumed  a walking regimen with plans to work on weight loss.  She had some dyspnea on exertion if more than usual activity, though improving, and attributed to weight.  Brilinta was reduced to 60 mg twice daily.  Recommendation was to follow-up within 1 year.  She returns to clinic today 06/20/2021 and reports that she is doing well from a cardiac standpoint.  She denies any chest pain, shortness of breath, racing heart rate, or palpitations.  She has not needed her sublingual nitro.  She has a history of vertigo and reports some syncope when laying down, for which she takes meclizine over-the-counter.  She reports that meclizine results in complete resolution of her symptoms.  No recent falls.  No signs or symptoms of bleeding.  She denies any signs or symptoms of volume overload.  Overall, she has felt great since her last visit.  She reports having a brief rough time during COVID-19, during which time she lost 3 family members.  She gained a little wt between visits but has since lost this wt plus another 10 lbs. she reports a catalyst to this as when went for blood work in June, and at which time she saw that A1C was elevated.  She is walking 3-4 miles a day.  She reports a low-sodium and heart healthy diet.  Through diet and exercise, her blood sugar has gone down and when checked in the AM it is 110.  She sleeps very well -no concerning symptoms for sleep apnea. Home BP typically 110s/70s.  She had COVID-19 between visits, though she fortunately was asymptomatic during that time.  She reports fatigue for the few weeks following her COVID-19, but has fully recovered from the virus since then.  She continues to remain safe with handwashing and masking, and she is up-to-date with her COVID-19 vaccinations.  She is expecting to have her second shingles vaccine soon.  In her free time, she enjoys spending time with her sons when able.  She reports that her son in [place] has moved back to the [place] and is now [employer].  Her other son still lives in [place], and she was able to see him recently.  She lost her prior FNP Carlean Purl) PCP and is awaiting to be reestablished with the PCP right now.  She reports that she will recheck her A1c when she reestablishes with a PCP later this year.  She requests refills for her medications, though notes Brilinta is pricey with Brilinta card provided as below.  She is in the donut hole for her insurance in October -she reports the price of Brilinta $55 for now and then October $165.00.   Home Medications   Current Outpatient Medications  Medication Instructions   aspirin EC 81 mg, Oral, Daily   atorvastatin (LIPITOR) 80 MG tablet TAKE 1 TABLET BY MOUTH EVERY DAY AT 6PM   carvedilol (COREG) 6.25 mg, Oral, 2 times daily   losartan (COZAAR) 25 mg, Oral, Daily  Multiple Vitamin (MULTIVITAMIN WITH MINERALS) TABS 1 tablet, Daily   nitroGLYCERIN (NITROSTAT) 0.4 mg, Sublingual, Every 5 min PRN   ticagrelor (BRILINTA) 60 mg, Oral, 2 times daily     Review of Systems    She denies chest pain, palpitations, dyspnea, pnd, orthopnea, n, v, syncope, edema, weight gain, or early satiety.  She reports unchanged dizziness, associated with her vertigo and resolved with meclizine.   All other systems reviewed and are otherwise negative except as noted above.  Physical Exam    VS:  BP 124/78 (BP Location: Left Arm, Patient Position: Sitting, Cuff Size: Large)   Pulse 64   Ht 5' (1.524 m)   Wt 209 lb (94.8 kg)   SpO2 96%   BMI 40.82 kg/m  , BMI Body mass index is 40.82 kg/m. GEN: Well nourished, well developed, in no acute distress.  Facemask in place. HEENT: normal. Neck: Supple, no JVD, carotid bruits, or masses. Cardiac: RRR, no murmurs, rubs, or gallops. No clubbing, cyanosis, edema.  Radials/DP/PT 2+ and equal bilaterally.  Respiratory:  Respirations regular and unlabored, clear to auscultation bilaterally. GI: Soft, nontender, nondistended, BS + x 4. MS: no  deformity or atrophy. Skin: warm and dry, no rash. Neuro:  Strength and sensation are intact. Psych: Normal affect.  Accessory Clinical Findings    ECG personally reviewed by me today -NSR, 64 bpm, stable anterior septal and inferior Q-wave, no acute changes from prior tracing 04/27/2020- no acute changes.  VITALS Reviewed today   Temp Readings from Last 3 Encounters:  05/19/21 97.6 F (36.4 C) (Temporal)  05/12/21 97.8 F (36.6 C) (Temporal)  04/28/20 97.7 F (36.5 C) (Temporal)   BP Readings from Last 3 Encounters:  06/20/21 124/78  05/19/21 118/74  05/12/21 106/72   Pulse Readings from Last 3 Encounters:  06/20/21 64  05/19/21 64  05/12/21 71    Wt Readings from Last 3 Encounters:  06/20/21 209 lb (94.8 kg)  05/19/21 219 lb (99.3 kg)  05/12/21 224 lb (101.6 kg)     LABS  reviewed today   Lab Results  Component Value Date   WBC 5.2 05/19/2021   HGB 14.5 05/19/2021   HCT 42.5 05/19/2021   MCV 84.8 05/19/2021   PLT 254.0 05/19/2021   Lab Results  Component Value Date   CREATININE 1.01 05/19/2021   BUN 34 (H) 05/19/2021   NA 138 05/19/2021   K 4.2 05/19/2021   CL 103 05/19/2021   CO2 25 05/19/2021   Lab Results  Component Value Date   ALT 19 05/19/2021   AST 21 05/19/2021   ALKPHOS 79 05/19/2021   BILITOT 1.0 05/19/2021   Lab Results  Component Value Date   CHOL 114 05/19/2021   HDL 29.30 (L) 05/19/2021   LDLCALC 59 05/19/2021   TRIG 128.0 05/19/2021   CHOLHDL 4 05/19/2021    Lab Results  Component Value Date   HGBA1C 6.7 (H) 05/19/2021   Lab Results  Component Value Date   TSH 2.47 08/26/2018     STUDIES/PROCEDURES reviewed today   Echo 09/10/2018 - Left ventricle: The cavity size was normal. Wall thickness was    increased in a pattern of moderate to severe LVH. Systolic    function was normal. The estimated ejection fraction was in the    range of 60% to 65%. Doppler parameters are consistent with    abnormal left ventricular  relaxation (grade 1 diastolic    dysfunction). Doppler parameters are consistent with high  ventricular filling pressure.  - Aortic valve: Trileaflet; mildly thickened, mildly calcified    leaflets.  - Ascending aorta: The ascending aorta was mildly dilated.  - Mitral valve: Calcified annulus. There was mild regurgitation.   LHC 05/2018 Conclusions: Anterior STEMI due to acute plaque rupture and thrombotic thrombotic subtotal occlusion of the mid LAD and D2. Moderate to severe but non-critical disease involving rPL branches. Cardiogenic shock with mildly to moderately elevated LVEDP. Successful PCI to mid LAD/D2 using Synergy 2.75 x 16 mm drug eluting stent in the LAD with 0% residual stenosis and angioplasty of the ostium of D2 with reduction of stenosis from 99% to 70%. Successful placement of 40 mL intra-aortic balloon pump via the right femoral artery. Atrial fibrillation with rapid ventricular response with unsuccessful DCCV x 1 at 200 J.  Rate control improved with amiodarone infusion. Recommendations: Administer ticagrelor 180 mg when possible and discontinue cangrelor 2 hours after ticagrelor administration. Titrate off norepinephrine as tolerated. Aggressive secondary prevention, including high-intensity statin therapy. Start heparin infusion 2 hours after TR band removal. Continue amiodarone infusion for rate control of atrial fibrillation.  Consider transitioning to beta-blocker once shock has resolved. CXR when patient arrives in ICU to confirm IABP placement. Wan BiPAP, as tolerated. Recommend uninterrupted dual antiplatelet therapy with Aspirin '81mg'$  daily and Ticagrelor '90mg'$  twice daily for a minimum of 12 months (ACS - Class I recommendation).  Left Heart  Left Ventricle LVEDP mildly to moderately elevated (18 mmHg at start of case and 26 mmHg at completion of procedure).  Aortic Valve There is no aortic valve stenosis.    Coronary Diagrams   Diagnostic Dominance:  Right    Intervention       Assessment & Plan    Coronary artery disease with history of STEMI 2019 --No chest pain or shortness of breath. EKG without acute ST/T changes.  No indication for repeat ischemic work-up at this time.  Continue current GDMT with ASA, Brilinta, beta-blocker, and statin.  Provided with $5 Brilinta card today, in case this is able to give her some assistance with the price of this medication.  Instructed to call the office if her medication continues to be expensive and let us now.  Continue heart healthy lifestyle / diet and exercise as she is doing.  HTN, goal BP less than 130/80 --BP well controlled.  Continue current antihypertensive regimen with carvedilol, losartan 25 mg daily.  H/o PAF/NSVT --Remote history of NSVT/PAF after STEMI and without recurrence since that time.  No tachypalpitations.  Given no recurrence of arrhythmia, anticoagulation/amiodarone subsequently discontinued.  No indication for repeat event monitor or antiarrhythmic/OAC at this time.  Hyperlipidemia, LDL goal less than 70 --Most recent 05/19/2021 LDL 59 and at goal.  Continue current atorvastatin 80 mg daily.  Continue to monitor LDL per PCP.  Elevated A1c --Recent elevated A1c at 6.7.  She has since made an overhaul to her exercise/diet routine.  Blood glucose Recommend recheck per PCP, which she plans to do once reestablished later this year.  Continue current diet and exercise routine.  History of vertigo --Longstanding history of vertigo.  Continue OTC meclizine, which adequately controls her symptoms.  Continue slow position changes.  No recent falls.  History of COVID-19 --Prior history of COVID-19.  Recovered well from this and without any residual symptoms.  Up-to-date with vaccines.  Continue facemask/handwashing.   Disposition: RTC 1 year, sooner if needed  *Please be aware that the above documentation was completed voice recognition software and may contain  dictation  errors.       Arvil Chaco, PA-C 06/20/2021

## 2021-06-27 ENCOUNTER — Ambulatory Visit
Admission: RE | Admit: 2021-06-27 | Discharge: 2021-06-27 | Disposition: A | Discharge status: Home / Self Care | Payer: PPO | Source: Ambulatory Visit | Attending: Primary Care | Admitting: Primary Care

## 2021-06-27 ENCOUNTER — Other Ambulatory Visit: Payer: Self-pay

## 2021-06-27 DIAGNOSIS — Z09 Encounter for follow-up examination after completed treatment for conditions other than malignant neoplasm: Secondary | ICD-10-CM

## 2021-06-27 DIAGNOSIS — R928 Other abnormal and inconclusive findings on diagnostic imaging of breast: Secondary | ICD-10-CM | POA: Diagnosis not present

## 2021-09-25 ENCOUNTER — Other Ambulatory Visit: Payer: Self-pay

## 2021-09-25 ENCOUNTER — Encounter (HOSPITAL_COMMUNITY): Payer: Self-pay | Admitting: Emergency Medicine

## 2021-09-25 ENCOUNTER — Inpatient Hospital Stay (HOSPITAL_COMMUNITY)
Admission: EM | Admit: 2021-09-25 | Discharge: 2021-09-27 | DRG: 354 | Disposition: A | Discharge status: Home / Self Care | Payer: PPO | Attending: Surgery | Admitting: Surgery

## 2021-09-25 DIAGNOSIS — I5022 Chronic systolic (congestive) heart failure: Secondary | ICD-10-CM | POA: Diagnosis present

## 2021-09-25 DIAGNOSIS — Z20822 Contact with and (suspected) exposure to covid-19: Secondary | ICD-10-CM | POA: Diagnosis present

## 2021-09-25 DIAGNOSIS — E119 Type 2 diabetes mellitus without complications: Secondary | ICD-10-CM | POA: Diagnosis present

## 2021-09-25 DIAGNOSIS — K436 Other and unspecified ventral hernia with obstruction, without gangrene: Secondary | ICD-10-CM | POA: Diagnosis not present

## 2021-09-25 DIAGNOSIS — I255 Ischemic cardiomyopathy: Secondary | ICD-10-CM | POA: Diagnosis present

## 2021-09-25 DIAGNOSIS — Z880 Allergy status to penicillin: Secondary | ICD-10-CM | POA: Diagnosis not present

## 2021-09-25 DIAGNOSIS — Z9889 Other specified postprocedural states: Secondary | ICD-10-CM

## 2021-09-25 DIAGNOSIS — R109 Unspecified abdominal pain: Secondary | ICD-10-CM | POA: Diagnosis not present

## 2021-09-25 DIAGNOSIS — I252 Old myocardial infarction: Secondary | ICD-10-CM

## 2021-09-25 DIAGNOSIS — I480 Paroxysmal atrial fibrillation: Secondary | ICD-10-CM | POA: Diagnosis not present

## 2021-09-25 DIAGNOSIS — Z885 Allergy status to narcotic agent status: Secondary | ICD-10-CM

## 2021-09-25 DIAGNOSIS — E785 Hyperlipidemia, unspecified: Secondary | ICD-10-CM | POA: Diagnosis not present

## 2021-09-25 DIAGNOSIS — I10 Essential (primary) hypertension: Secondary | ICD-10-CM | POA: Diagnosis not present

## 2021-09-25 DIAGNOSIS — Z8249 Family history of ischemic heart disease and other diseases of the circulatory system: Secondary | ICD-10-CM | POA: Diagnosis not present

## 2021-09-25 DIAGNOSIS — I110 Hypertensive heart disease with heart failure: Secondary | ICD-10-CM | POA: Diagnosis not present

## 2021-09-25 DIAGNOSIS — K460 Unspecified abdominal hernia with obstruction, without gangrene: Secondary | ICD-10-CM | POA: Diagnosis not present

## 2021-09-25 DIAGNOSIS — I2510 Atherosclerotic heart disease of native coronary artery without angina pectoris: Secondary | ICD-10-CM | POA: Diagnosis present

## 2021-09-25 DIAGNOSIS — Z888 Allergy status to other drugs, medicaments and biological substances status: Secondary | ICD-10-CM

## 2021-09-25 LAB — CBC WITH DIFFERENTIAL/PLATELET
Abs Immature Granulocytes: 0.02 10*3/uL (ref 0.00–0.07)
Basophils Absolute: 0 10*3/uL (ref 0.0–0.1)
Basophils Relative: 1 %
Eosinophils Absolute: 0 10*3/uL (ref 0.0–0.5)
Eosinophils Relative: 1 %
HCT: 39.9 % (ref 36.0–46.0)
Hemoglobin: 13 g/dL (ref 12.0–15.0)
Immature Granulocytes: 0 %
Lymphocytes Relative: 13 %
Lymphs Abs: 0.7 10*3/uL (ref 0.7–4.0)
MCH: 29 pg (ref 26.0–34.0)
MCHC: 32.6 g/dL (ref 30.0–36.0)
MCV: 89.1 fL (ref 80.0–100.0)
Monocytes Absolute: 0.3 10*3/uL (ref 0.1–1.0)
Monocytes Relative: 6 %
Neutro Abs: 4.7 10*3/uL (ref 1.7–7.7)
Neutrophils Relative %: 79 %
Platelets: 207 10*3/uL (ref 150–400)
RBC: 4.48 MIL/uL (ref 3.87–5.11)
RDW: 13.5 % (ref 11.5–15.5)
WBC: 5.8 10*3/uL (ref 4.0–10.5)
nRBC: 0 % (ref 0.0–0.2)

## 2021-09-25 LAB — COMPREHENSIVE METABOLIC PANEL
ALT: 18 U/L (ref 0–44)
AST: 22 U/L (ref 15–41)
Albumin: 4.1 g/dL (ref 3.5–5.0)
Alkaline Phosphatase: 63 U/L (ref 38–126)
Anion gap: 10 (ref 5–15)
BUN: 18 mg/dL (ref 8–23)
CO2: 26 mmol/L (ref 22–32)
Calcium: 9.1 mg/dL (ref 8.9–10.3)
Chloride: 101 mmol/L (ref 98–111)
Creatinine, Ser: 0.78 mg/dL (ref 0.44–1.00)
GFR, Estimated: >60 mL/min (ref >60)
Glucose, Bld: 176 mg/dL — ABNORMAL HIGH (ref 70–99)
Potassium: 3.6 mmol/L (ref 3.5–5.1)
Sodium: 137 mmol/L (ref 135–145)
Total Bilirubin: 1 mg/dL (ref 0.3–1.2)
Total Protein: 7.2 g/dL (ref 6.5–8.1)

## 2021-09-25 LAB — LIPASE, BLOOD: Lipase: 36 U/L (ref 11–51)

## 2021-09-25 LAB — LACTIC ACID, PLASMA: Lactic Acid, Venous: 1.1 mmol/L (ref 0.5–1.9)

## 2021-09-25 MED ORDER — ONDANSETRON HCL 4 MG/2ML IJ SOLN
4.0000 mg | Freq: Once | INTRAMUSCULAR | Status: AC
  Administered 2021-09-26: 4 mg via INTRAVENOUS
  Filled 2021-09-25: qty 2

## 2021-09-25 MED ORDER — FENTANYL CITRATE PF 50 MCG/ML IJ SOSY
50.0000 ug | PREFILLED_SYRINGE | INTRAMUSCULAR | Status: AC
  Administered 2021-09-26 (×2): 50 ug via INTRAVENOUS
  Filled 2021-09-25 (×2): qty 1

## 2021-09-25 NOTE — ED Provider Notes (Signed)
Emergency Medicine Provider Triage Evaluation Note  Lindsey Pierce , a 70 y.o. female  was evaluated in triage.  Pt complains of sudden onset, constant, sharp, .  Periumbilical abdominal pain that began last night.  Reports history of small hernia in the past.  Last night she sneezed and started having worsening pain in this area.  She states her abdomen is now hard and swollen.  She is also been vomiting.  She reports her last bowel movement was yesterday.  She has not been paying attention to whether or not she has been passing gas today.  No fevers.  Previous past surgical history to abdomen includes appendectomy and cholecystectomy.   Review of Systems  Positive: + abd pain, nausea, vomiting Negative: - constipation, fevers  Physical Exam  BP (!) 144/73 (BP Location: Left Arm)   Pulse 67   Temp 97.6 F (36.4 C) (Oral)   Resp 16   SpO2 99%  Gen:   Awake, no distress   Resp:  Normal effort  MSK:   Moves extremities without difficulty  Other:  Large hernia noted to periumbilical region that is hard to the touch and TTP. Unable to easily reduce in triage. No overlying skin changes. Bowel sounds absent.   Medical Decision Making  Medically screening exam initiated at 5:39 PM.  Appropriate orders placed.  Lindsey Pierce was informed that the remainder of the evaluation will be completed by another provider, this initial triage assessment does not replace that evaluation, and the importance of remaining in the ED until their evaluation is complete.    Lindsey Maize, PA-C 09/25/21 1739    Lindsey Pick, MD 09/26/21 [phone]

## 2021-09-25 NOTE — ED Provider Notes (Signed)
[facility] Provider Note   CSN: [id number] Arrival date & time: 09/25/21  1549     History Chief Complaint  Patient presents with   Abdominal Pain    Lindsey Pierce is a 70 y.o. female with a hx of hypertension, hyperlipidemia, CAD, PAF, T2DM, and prior open cholecystectomy & appendectomy who presents to the ED with complaints of abdominal pain that began acutely @12 :41. Patient reports she had a coughing spell with an episode of emesis with acute onset of abdominal pain with bulging/swelling to the periumbilical region. Pain has been constant since onset, worse with vomiting, no alleviating factors. Reports many episodes of emesis throughout the day today, had 1 very small bowel movement since onset of pain, unsure if she has been passing gas. Hx of small umbilical hernia that has not given her issues previously. Denies fever, hematemesis, melena, hematochezia, dysuria, diarrhea, chest pain or dyspnea.     HPI     Past Medical History:  Diagnosis Date   CAD (coronary artery disease)    a. 05/2018 Ant STEMI/PCI: LM nl, LAD 28m(2.75 x 16 Synergy DES), D2 99 (PTCA), RI 40ost, LCX mild diff dzs, OM1/2/3 nl, RCA large, diffuse dzs, RPDA nl, RPAV 60, RPLB1 80 (small).   Diabetes ([place])    HFrEF (heart failure with reduced ejection fraction) ([place])    a. 05/2018 Echo: EF 40-45%, mod LVH, Gr2 DD, mid antsept, apical septal, apical AK. Mid-apical ant, mid infsept HK. Nl RV fxn. Triv TR. b. Echo 08/2018: mod-sev. LVH, EF 60-65%, Grade 1 DD, AV-mild calcification, mild MR, mild dialation As. Aorta.   Hyperlipidemia    Hypertension    Ischemic cardiomyopathy    a. 05/2018 Echo: EF 40-45%. b. 08/2018 Echo: EF 60-65%   PAF (paroxysmal atrial fibrillation) ([place])    a. 05/2018 at time of STEMI, converted on Amio. No OAC.   STEMI involving left anterior descending coronary artery ([place])    a. 02/6658 - complicated by CGS req IABP and VT req amiodarone.    V-tach    a. 05/2018 at time of Ant STEMI-->Amio.    Patient Active Problem List   Diagnosis Date Noted   Preventative health care 05/19/2021   COVID-19 virus infection 05/12/2021   Type 2 diabetes mellitus ([place]) 06/16/2019   Class 3 severe obesity due to excess calories with serious comorbidity and body mass index (BMI) of 40.0 to 44.9 in adult [facility]) 06/16/2019   Shortness of breath 03/11/2019   Coronary artery disease involving native coronary artery of native heart without angina pectoris 01/18/2019   Ischemic cardiomyopathy 01/18/2019   Paroxysmal atrial fibrillation ([place]) 06/22/2018   Ventricular tachycardia 06/22/2018   Morbid obesity ([place]) 06/22/2018   Essential hypertension 06/06/2018   Hyperlipidemia LDL goal <70 06/06/2018   Hypokalemia 06/06/2018    Past Surgical History:  Procedure Laterality Date   CARDIAC CATHETERIZATION     CORONARY/GRAFT ACUTE MI REVASCULARIZATION N/A 05/31/2018   Procedure: Coronary/Graft Acute MI Revascularization;  Surgeon: [name], MD;  Location: [facility];  Service: Cardiovascular;  Laterality: N/A;   IABP INSERTION N/A 05/31/2018   Procedure: IABP Insertion;  Surgeon: [name], MD;  Location: [facility];  Service: Cardiovascular;  Laterality: N/A;   LEFT HEART CATH AND CORONARY ANGIOGRAPHY N/A 05/31/2018   Procedure: LEFT HEART CATH AND CORONARY ANGIOGRAPHY;  Surgeon: [name], MD;  Location: [facility];  Service: Cardiovascular;  Laterality: N/A;     OB History  No obstetric history on file.     Family History  Problem Relation Age of Onset   Valvular heart disease Mother     Social History   Tobacco Use   Smoking status: Never   Smokeless tobacco: Never  Vaping Use   Vaping Use: Never used  Substance Use Topics   Alcohol use: No   Drug use: No    Home Medications Prior to Admission medications   Medication Sig Start Date End Date Taking? Authorizing Provider  aspirin EC 81  MG tablet Take 81 mg by mouth daily.    [provider]  atorvastatin (LIPITOR) 80 MG tablet TAKE 1 TABLET BY MOUTH EVERY DAY AT [facility] 06/20/21   Marrianne Mood D, PA-C  carvedilol (COREG) 6.25 MG tablet Take 1 tablet (6.25 mg total) by mouth 2 (two) times daily. 06/20/21   Marrianne Mood D, PA-C  losartan (COZAAR) 25 MG tablet Take 1 tablet (25 mg total) by mouth daily. 06/20/21   Marrianne Mood D, PA-C  Multiple Vitamin (MULTIVITAMIN WITH MINERALS) TABS Take 1 tablet by mouth daily.    [provider]  nitroGLYCERIN (NITROSTAT) 0.4 MG SL tablet Place 1 tablet (0.4 mg total) under the tongue every 5 (five) minutes as needed. Patient not taking: Reported on 06/20/2021 06/06/18   Reino Bellis B, NP  ticagrelor (BRILINTA) 60 MG TABS tablet Take 1 tablet (60 mg total) by mouth 2 (two) times daily. 06/20/21   Marrianne Mood D, PA-C    Allergies    Codeine, Darvon [propoxyphene hcl], and Penicillins  Review of Systems   Review of Systems  Constitutional:  Negative for fever.  Respiratory:  Negative for shortness of breath.   Cardiovascular:  Negative for chest pain.  Gastrointestinal:  Positive for abdominal pain, nausea and vomiting. Negative for blood in stool and diarrhea.  Genitourinary:  Negative for dysuria.  Neurological:  Negative for syncope.  All other systems reviewed and are negative.  Physical Exam Updated Vital Signs BP (!) 144/73 (BP Location: Left Arm)   Pulse 67   Temp 97.6 F (36.4 C) (Oral)   Resp 16   SpO2 99%   Physical Exam Vitals and nursing note reviewed.  Constitutional:      General: She is not in acute distress.    Appearance: She is well-developed. She is not toxic-appearing.  HENT:     Head: Normocephalic and atraumatic.  Eyes:     General:        Right eye: No discharge.        Left eye: No discharge.     Conjunctiva/sclera: Conjunctivae normal.  Cardiovascular:     Rate and Rhythm: Normal rate and regular rhythm.  Pulmonary:      Effort: Pulmonary effort is normal. No respiratory distress.     Breath sounds: Normal breath sounds. No wheezing, rhonchi or rales.  Abdominal:     General: There is no distension.     Palpations: Abdomen is soft.     Tenderness: There is generalized abdominal tenderness and tenderness in the periumbilical area.     Hernia: A hernia is present. Hernia is present in the umbilical area (firm to palpation, unable to reduce at this time).  Musculoskeletal:     Cervical back: Neck supple.  Skin:    General: Skin is warm and dry.     Findings: No rash.  Neurological:     Mental Status: She is alert.     Comments: Clear speech.   Psychiatric:  Behavior: Behavior normal.    ED Results / Procedures / Treatments   Labs (all labs ordered are listed, but only abnormal results are displayed) Labs Reviewed  COMPREHENSIVE METABOLIC PANEL - Abnormal; Notable for the following components:      Result Value   Glucose, Bld 176 (*)    All other components within normal limits  RESP PANEL BY RT-PCR (FLU A&B, COVID) ARPGX2  LIPASE, BLOOD  CBC WITH DIFFERENTIAL/PLATELET  LACTIC ACID, PLASMA  URINALYSIS, ROUTINE W REFLEX MICROSCOPIC  LACTIC ACID, PLASMA    EKG None  Radiology CT Abdomen Pelvis W Contrast  Result Date: 09/26/2021 CLINICAL DATA:  Umbilical hernia, acute abdominal pain. EXAM: CT ABDOMEN AND PELVIS WITH CONTRAST TECHNIQUE: Multidetector CT imaging of the abdomen and pelvis was performed using the standard protocol following bolus administration of intravenous contrast. CONTRAST:  64mL OMNIPAQUE IOHEXOL 350 MG/ML SOLN COMPARISON:  None. FINDINGS: Lower chest: Visualized lung bases are clear. At least moderate coronary artery calcification. Global cardiac size within normal limits. Hepatobiliary: Tiny hypodensity within the right hepatic dome is too small to accurately characterize but may represent a tiny cyst in a patient without a history of malignancy. The liver is  otherwise unremarkable. No intra or extrahepatic biliary ductal dilation. Status post cholecystectomy. Pancreas: Unremarkable Spleen: Unremarkable Adrenals/Urinary Tract: Adrenal glands are unremarkable. Kidneys are normal, without renal calculi, focal lesion, or hydronephrosis. Bladder is unremarkable. Stomach/Bowel: A small bowel obstruction has developed secondary to a herniated loop of small bowel within a large umbilical hernia. At least 2 points of transition are identified, best seen on sagittal image # 91/7 and the intervening loop of small bowel within the hernia sac is fluid-filled and dilated but demonstrates normal enhancement. The small bowel proximal to this point is mildly dilated in fluid-filled. Distally, the small bowel and large bowel are decompressed. The hernia sac measures 7.0 x 10.5 cm. The hernia defect measures 2.2 x 2.5 cm. The stomach, small bowel, and large bowel are otherwise unremarkable. Appendectomy has been performed. No free intraperitoneal gas or fluid. Vascular/Lymphatic: Aortic atherosclerosis. No enlarged abdominal or pelvic lymph nodes. Reproductive: Uterus and bilateral adnexa are unremarkable. Other: The rectum is unremarkable Musculoskeletal: Degenerative changes are seen within the lumbar spine. No lytic or blastic bone lesions are identified. No acute bone abnormality. IMPRESSION: Large umbilical hernia containing a single loop of mid small bowel resulting in a small bowel obstruction. Two points of obstruction result in a closed loop obstruction involving the segment of bowel within the hernia sac, however, this loop demonstrates normal enhancement and no significant mesenteric edema at this time. Aortic Atherosclerosis (ICD10-I70.0). Electronically Signed   By: Fidela Salisbury M.D.   On: 09/26/2021 01:50    Procedures Procedures   Medications Ordered in ED Medications - No data to display  ED Course  I have reviewed the triage vital signs and the nursing  notes.  Pertinent labs & imaging results that were available during my care of the patient were reviewed by me and considered in my medical decision making (see chart for details).    MDM Rules/Calculators/A&P                           Patient presents to the ED with complaints of abdominal pain. Nontoxic, vitals without significant abnormality.   Additional history obtained:  Additional history obtained from chart review & nursing note review.   Lab Tests:  I reviewed and interpreted labs, which included:  CBC: Unremarkable.  CMP: Hyperglycemia without acidosis or anion gap elevation. LFTs & Renal function WNL.  Lipase: WNL Lactic acid: WNL UA: No UTI, ketonuria/glucosuria.  COVID/flu: Negative.   Plan for fentanyl for pain, zofran for nausea, and will apply cold pack to hernia with CT pending and plan for attempted reduction.    Imaging Studies ordered:  CT A/P w contrast ordered in triage, I independently reviewed, formal radiology impression shows:  Large umbilical hernia containing a single loop of mid small bowel resulting in a small bowel obstruction. Two points of obstruction result in a closed loop obstruction involving the segment of bowel within the hernia sac, however, this loop demonstrates normal enhancement and no significant mesenteric edema at this time. Aortic Atherosclerosis   ED Course:  Discussed with radiologist Dr. Christa See regarding CT imaging- he relays there does not appear to be pneumatosis, only non-dependent air within the lumen.   02:30: Attempted reduction of hernia w/ attending, unsuccessful, will discuss w/ general surgery given concern for incarcerated hernia, additional patient's repeat lactic acid is noted to be elevated. NG tube ordered.  02:50: CONSULT: Discussed with general surgeon Dr.  Zenia Resides- will come to the ED to evaluate the patient.   04:00: Patient taken to OR.   This is a shared visit with supervising physician Dr. Randal Buba who has  independently evaluated patient & provided guidance - in agreement with care   Portions of this note were generated with Dragon dictation software. Dictation errors may occur despite best attempts at proofreading.  Final Clinical Impression(s) / ED Diagnoses Final diagnoses:  Incarcerated hernia    Rx / DC Orders ED Discharge Orders     None        Amaryllis Dyke, PA-C 09/26/21 0518    Palumbo, April, MD 09/26/21 [phone]

## 2021-09-25 NOTE — ED Triage Notes (Signed)
Pt reports severe periumbilical abd pain that started last night after sneezing with hard swollen area.  Hx of small hernia.  States it felt like a zipper coming undone.   Reports vomiting.  Last BM yesterday.  Denies fever and chills.

## 2021-09-26 ENCOUNTER — Emergency Department (HOSPITAL_COMMUNITY): Payer: PPO | Admitting: Anesthesiology

## 2021-09-26 ENCOUNTER — Emergency Department (HOSPITAL_COMMUNITY): Payer: PPO

## 2021-09-26 ENCOUNTER — Encounter (HOSPITAL_COMMUNITY): Admission: EM | Disposition: A | Discharge status: Home / Self Care | Payer: Self-pay | Source: Home / Self Care

## 2021-09-26 ENCOUNTER — Encounter (HOSPITAL_COMMUNITY): Payer: Self-pay | Admitting: Surgery

## 2021-09-26 DIAGNOSIS — K420 Umbilical hernia with obstruction, without gangrene: Secondary | ICD-10-CM | POA: Diagnosis not present

## 2021-09-26 DIAGNOSIS — R109 Unspecified abdominal pain: Secondary | ICD-10-CM | POA: Diagnosis not present

## 2021-09-26 DIAGNOSIS — K436 Other and unspecified ventral hernia with obstruction, without gangrene: Secondary | ICD-10-CM | POA: Diagnosis present

## 2021-09-26 DIAGNOSIS — I110 Hypertensive heart disease with heart failure: Secondary | ICD-10-CM | POA: Diagnosis not present

## 2021-09-26 DIAGNOSIS — I5022 Chronic systolic (congestive) heart failure: Secondary | ICD-10-CM | POA: Diagnosis not present

## 2021-09-26 DIAGNOSIS — I480 Paroxysmal atrial fibrillation: Secondary | ICD-10-CM | POA: Diagnosis not present

## 2021-09-26 DIAGNOSIS — Z9889 Other specified postprocedural states: Secondary | ICD-10-CM

## 2021-09-26 DIAGNOSIS — E876 Hypokalemia: Secondary | ICD-10-CM | POA: Diagnosis not present

## 2021-09-26 DIAGNOSIS — Z20822 Contact with and (suspected) exposure to covid-19: Secondary | ICD-10-CM | POA: Diagnosis not present

## 2021-09-26 HISTORY — PX: INSERTION OF MESH: SHX5868

## 2021-09-26 HISTORY — PX: UMBILICAL HERNIA REPAIR: SHX196

## 2021-09-26 LAB — URINALYSIS, ROUTINE W REFLEX MICROSCOPIC
Bilirubin Urine: NEGATIVE
Glucose, UA: NEGATIVE mg/dL
Hgb urine dipstick: NEGATIVE
Ketones, ur: 20 mg/dL — AB
Leukocytes,Ua: NEGATIVE
Nitrite: NEGATIVE
Protein, ur: 30 mg/dL — AB
Specific Gravity, Urine: 1.028 (ref 1.005–1.030)
pH: 5 (ref 5.0–8.0)

## 2021-09-26 LAB — RESP PANEL BY RT-PCR (FLU A&B, COVID) ARPGX2
Influenza A by PCR: NEGATIVE
Influenza B by PCR: NEGATIVE
SARS Coronavirus 2 by RT PCR: NEGATIVE

## 2021-09-26 LAB — GLUCOSE, CAPILLARY
Glucose-Capillary: 160 mg/dL — ABNORMAL HIGH (ref 70–99)
Glucose-Capillary: 143 mg/dL — ABNORMAL HIGH (ref 70–99)
Glucose-Capillary: 168 mg/dL — ABNORMAL HIGH (ref 70–99)
Glucose-Capillary: 104 mg/dL — ABNORMAL HIGH (ref 70–99)

## 2021-09-26 LAB — LACTIC ACID, PLASMA: Lactic Acid, Venous: 3.9 mmol/L (ref 0.5–1.9)

## 2021-09-26 SURGERY — REPAIR, HERNIA, UMBILICAL, ADULT
Anesthesia: General | Site: Abdomen

## 2021-09-26 MED ORDER — ARTIFICIAL TEARS OPHTHALMIC OINT
TOPICAL_OINTMENT | OPHTHALMIC | Status: AC
  Filled 2021-09-26: qty 3.5

## 2021-09-26 MED ORDER — METRONIDAZOLE 500 MG/100ML IV SOLN: 500.0000 mg | INTRAVENOUS | Status: DC

## 2021-09-26 MED ORDER — METRONIDAZOLE 500 MG/100ML IV SOLN
500.0000 mg | Freq: Once | INTRAVENOUS | Status: AC
  Administered 2021-09-26: 500 mg via INTRAVENOUS
  Filled 2021-09-26: qty 100

## 2021-09-26 MED ORDER — FENTANYL CITRATE (PF) 100 MCG/2ML IJ SOLN: 25.0000 ug | INTRAMUSCULAR | Status: DC | PRN

## 2021-09-26 MED ORDER — ASPIRIN EC 81 MG PO TBEC
81.0000 mg | DELAYED_RELEASE_TABLET | Freq: Every day | ORAL | Status: DC
  Administered 2021-09-26 – 2021-09-27 (×2): 81 mg via ORAL
  Filled 2021-09-26 (×2): qty 1

## 2021-09-26 MED ORDER — DIPHENHYDRAMINE HCL 50 MG/ML IJ SOLN: 12.5000 mg | Freq: Four times a day (QID) | INTRAMUSCULAR | Status: DC | PRN

## 2021-09-26 MED ORDER — IOHEXOL 350 MG/ML SOLN
75.0000 mL | Freq: Once | INTRAVENOUS | Status: AC | PRN
  Administered 2021-09-26: 75 mL via INTRAVENOUS

## 2021-09-26 MED ORDER — CEFAZOLIN SODIUM-DEXTROSE 2-3 GM-%(50ML) IV SOLR
INTRAVENOUS | Status: DC | PRN
  Administered 2021-09-26: 2 g via INTRAVENOUS

## 2021-09-26 MED ORDER — ONDANSETRON 4 MG PO TBDP: 4.0000 mg | ORAL_TABLET | Freq: Four times a day (QID) | ORAL | Status: DC | PRN

## 2021-09-26 MED ORDER — ADULT MULTIVITAMIN W/MINERALS CH
1.0000 | ORAL_TABLET | Freq: Every day | ORAL | Status: DC
  Administered 2021-09-26 – 2021-09-27 (×2): 1 via ORAL
  Filled 2021-09-26 (×2): qty 1

## 2021-09-26 MED ORDER — TRAMADOL HCL 50 MG PO TABS
50.0000 mg | ORAL_TABLET | Freq: Four times a day (QID) | ORAL | Status: DC | PRN
  Filled 2021-09-26: qty 1

## 2021-09-26 MED ORDER — ACETAMINOPHEN 10 MG/ML IV SOLN
INTRAVENOUS | Status: AC
  Filled 2021-09-26: qty 100

## 2021-09-26 MED ORDER — CARVEDILOL 6.25 MG PO TABS
6.2500 mg | ORAL_TABLET | Freq: Two times a day (BID) | ORAL | Status: DC
  Administered 2021-09-26 – 2021-09-27 (×2): 6.25 mg via ORAL
  Filled 2021-09-26 (×2): qty 1

## 2021-09-26 MED ORDER — CEFAZOLIN SODIUM-DEXTROSE 2-4 GM/100ML-% IV SOLN: 2.0000 g | INTRAVENOUS | Status: DC

## 2021-09-26 MED ORDER — PROPOFOL 10 MG/ML IV BOLUS
INTRAVENOUS | Status: AC
  Filled 2021-09-26: qty 20

## 2021-09-26 MED ORDER — CEFAZOLIN SODIUM 1 G IJ SOLR
INTRAMUSCULAR | Status: AC
  Filled 2021-09-26: qty 20

## 2021-09-26 MED ORDER — LACTATED RINGERS IV SOLN: INTRAVENOUS | Status: DC

## 2021-09-26 MED ORDER — OXYCODONE HCL 5 MG PO TABS
ORAL_TABLET | ORAL | Status: AC
  Filled 2021-09-26: qty 1

## 2021-09-26 MED ORDER — ROCURONIUM BROMIDE 10 MG/ML (PF) SYRINGE
PREFILLED_SYRINGE | INTRAVENOUS | Status: DC | PRN
  Administered 2021-09-26: 50 mg via INTRAVENOUS

## 2021-09-26 MED ORDER — MIDAZOLAM HCL 2 MG/2ML IJ SOLN
INTRAMUSCULAR | Status: AC
  Filled 2021-09-26: qty 2

## 2021-09-26 MED ORDER — PROCHLORPERAZINE EDISYLATE 10 MG/2ML IJ SOLN: 5.0000 mg | Freq: Once | INTRAMUSCULAR | Status: DC

## 2021-09-26 MED ORDER — TRAMADOL HCL 50 MG PO TABS
ORAL_TABLET | ORAL | Status: AC
  Administered 2021-09-26: 50 mg via ORAL
  Filled 2021-09-26: qty 1

## 2021-09-26 MED ORDER — FENTANYL CITRATE (PF) 250 MCG/5ML IJ SOLN
INTRAMUSCULAR | Status: AC
  Filled 2021-09-26: qty 5

## 2021-09-26 MED ORDER — SUCCINYLCHOLINE CHLORIDE 200 MG/10ML IV SOSY
PREFILLED_SYRINGE | INTRAVENOUS | Status: AC
  Filled 2021-09-26: qty 10

## 2021-09-26 MED ORDER — ONDANSETRON HCL 4 MG/2ML IJ SOLN
INTRAMUSCULAR | Status: DC | PRN
  Administered 2021-09-26: 4 mg via INTRAVENOUS

## 2021-09-26 MED ORDER — LIDOCAINE 2% (20 MG/ML) 5 ML SYRINGE
INTRAMUSCULAR | Status: AC
  Filled 2021-09-26: qty 5

## 2021-09-26 MED ORDER — INSULIN ASPART 100 UNIT/ML IJ SOLN: 0.0000 [IU] | Freq: Every day | INTRAMUSCULAR | Status: DC

## 2021-09-26 MED ORDER — LIDOCAINE 2% (20 MG/ML) 5 ML SYRINGE
INTRAMUSCULAR | Status: DC | PRN
  Administered 2021-09-26: 80 mg via INTRAVENOUS

## 2021-09-26 MED ORDER — EPHEDRINE 5 MG/ML INJ
INTRAVENOUS | Status: AC
  Filled 2021-09-26: qty 5

## 2021-09-26 MED ORDER — CEFAZOLIN SODIUM-DEXTROSE 2-4 GM/100ML-% IV SOLN: 2.0000 g | Freq: Once | INTRAVENOUS | Status: DC

## 2021-09-26 MED ORDER — ONDANSETRON HCL 4 MG/2ML IJ SOLN
INTRAMUSCULAR | Status: AC
  Filled 2021-09-26: qty 2

## 2021-09-26 MED ORDER — LACTATED RINGERS IV BOLUS: 500.0000 mL | Freq: Once | INTRAVENOUS | Status: DC

## 2021-09-26 MED ORDER — CHLORHEXIDINE GLUCONATE CLOTH 2 % EX PADS: 6.0000 | MEDICATED_PAD | Freq: Once | CUTANEOUS | Status: DC

## 2021-09-26 MED ORDER — LACTATED RINGERS IV SOLN: INTRAVENOUS | Status: DC | PRN

## 2021-09-26 MED ORDER — BUPIVACAINE-EPINEPHRINE (PF) 0.25% -1:200000 IJ SOLN
INTRAMUSCULAR | Status: AC
  Filled 2021-09-26: qty 30

## 2021-09-26 MED ORDER — SUCCINYLCHOLINE CHLORIDE 200 MG/10ML IV SOSY
PREFILLED_SYRINGE | INTRAVENOUS | Status: DC | PRN
  Administered 2021-09-26: 100 mg via INTRAVENOUS

## 2021-09-26 MED ORDER — FENTANYL CITRATE (PF) 250 MCG/5ML IJ SOLN
INTRAMUSCULAR | Status: DC | PRN
Start: 2021-09-26 — End: 2021-09-26
  Administered 2021-09-26: 100 ug via INTRAVENOUS
  Administered 2021-09-26: 50 ug via INTRAVENOUS
  Administered 2021-09-26: 100 ug via INTRAVENOUS

## 2021-09-26 MED ORDER — METHOCARBAMOL 500 MG PO TABS
500.0000 mg | ORAL_TABLET | Freq: Four times a day (QID) | ORAL | Status: DC
  Administered 2021-09-26 – 2021-09-27 (×3): 500 mg via ORAL
  Filled 2021-09-26 (×3): qty 1

## 2021-09-26 MED ORDER — PROPOFOL 10 MG/ML IV BOLUS
INTRAVENOUS | Status: DC | PRN
  Administered 2021-09-26: 70 mg via INTRAVENOUS
  Administered 2021-09-26: 30 mg via INTRAVENOUS

## 2021-09-26 MED ORDER — ATORVASTATIN CALCIUM 80 MG PO TABS
80.0000 mg | ORAL_TABLET | Freq: Every day | ORAL | Status: DC
  Administered 2021-09-26: 80 mg via ORAL
  Filled 2021-09-26: qty 1

## 2021-09-26 MED ORDER — HYDROMORPHONE HCL 1 MG/ML IJ SOLN: 0.5000 mg | INTRAMUSCULAR | Status: DC | PRN

## 2021-09-26 MED ORDER — INSULIN ASPART 100 UNIT/ML IJ SOLN
0.0000 [IU] | Freq: Three times a day (TID) | INTRAMUSCULAR | Status: DC
  Administered 2021-09-26 – 2021-09-27 (×2): 3 [IU] via SUBCUTANEOUS

## 2021-09-26 MED ORDER — ROCURONIUM BROMIDE 10 MG/ML (PF) SYRINGE
PREFILLED_SYRINGE | INTRAVENOUS | Status: AC
  Filled 2021-09-26: qty 10

## 2021-09-26 MED ORDER — DOCUSATE SODIUM 100 MG PO CAPS
100.0000 mg | ORAL_CAPSULE | Freq: Two times a day (BID) | ORAL | Status: DC
  Administered 2021-09-26 – 2021-09-27 (×3): 100 mg via ORAL
  Filled 2021-09-26 (×3): qty 1

## 2021-09-26 MED ORDER — MIDAZOLAM HCL 5 MG/5ML IJ SOLN
INTRAMUSCULAR | Status: DC | PRN
  Administered 2021-09-26: 2 mg via INTRAVENOUS

## 2021-09-26 MED ORDER — ACETAMINOPHEN 500 MG PO TABS
1000.0000 mg | ORAL_TABLET | Freq: Three times a day (TID) | ORAL | Status: DC
  Administered 2021-09-26 – 2021-09-27 (×3): 1000 mg via ORAL
  Filled 2021-09-26 (×3): qty 2

## 2021-09-26 MED ORDER — OXYCODONE HCL 5 MG PO TABS: 5.0000 mg | ORAL_TABLET | ORAL | Status: DC | PRN

## 2021-09-26 MED ORDER — ONDANSETRON HCL 4 MG/2ML IJ SOLN: 4.0000 mg | Freq: Once | INTRAMUSCULAR | Status: DC

## 2021-09-26 MED ORDER — ONDANSETRON HCL 4 MG/2ML IJ SOLN: 4.0000 mg | Freq: Once | INTRAMUSCULAR | Status: DC | PRN

## 2021-09-26 MED ORDER — HYDROMORPHONE HCL 1 MG/ML IJ SOLN: 0.5000 mg | Freq: Once | INTRAMUSCULAR | Status: DC

## 2021-09-26 MED ORDER — PHENYLEPHRINE 40 MCG/ML (10ML) SYRINGE FOR IV PUSH (FOR BLOOD PRESSURE SUPPORT)
PREFILLED_SYRINGE | INTRAVENOUS | Status: AC
  Filled 2021-09-26: qty 10

## 2021-09-26 MED ORDER — ONDANSETRON HCL 4 MG/2ML IJ SOLN
4.0000 mg | Freq: Four times a day (QID) | INTRAMUSCULAR | Status: DC | PRN
  Filled 2021-09-26: qty 2

## 2021-09-26 MED ORDER — ENOXAPARIN SODIUM 40 MG/0.4ML IJ SOSY
40.0000 mg | PREFILLED_SYRINGE | INTRAMUSCULAR | Status: DC
  Administered 2021-09-27: 40 mg via SUBCUTANEOUS
  Filled 2021-09-26: qty 0.4

## 2021-09-26 MED ORDER — 0.9 % SODIUM CHLORIDE (POUR BTL) OPTIME
TOPICAL | Status: DC | PRN
  Administered 2021-09-26 (×2): 1000 mL

## 2021-09-26 MED ORDER — SUGAMMADEX SODIUM 200 MG/2ML IV SOLN
INTRAVENOUS | Status: DC | PRN
  Administered 2021-09-26: 200 mg via INTRAVENOUS

## 2021-09-26 MED ORDER — PHENYLEPHRINE HCL (PRESSORS) 10 MG/ML IV SOLN
INTRAVENOUS | Status: DC | PRN
  Administered 2021-09-26 (×2): 80 ug via INTRAVENOUS

## 2021-09-26 MED ORDER — ACETAMINOPHEN 10 MG/ML IV SOLN
INTRAVENOUS | Status: DC | PRN
  Administered 2021-09-26: 1000 mg via INTRAVENOUS

## 2021-09-26 MED ORDER — DIPHENHYDRAMINE HCL 12.5 MG/5ML PO ELIX: 12.5000 mg | ORAL_SOLUTION | Freq: Four times a day (QID) | ORAL | Status: DC | PRN

## 2021-09-26 SURGICAL SUPPLY — 39 items
BAG COUNTER SPONGE SURGICOUNT (BAG) ×2 IMPLANT
BAG SURGICOUNT SPONGE COUNTING (BAG) ×1
BENZOIN TINCTURE PRP APPL 2/3 (GAUZE/BANDAGES/DRESSINGS) ×3 IMPLANT
BLADE CLIPPER SURG (BLADE) IMPLANT
CANISTER SUCT 3000ML PPV (MISCELLANEOUS) ×3 IMPLANT
CHLORAPREP W/TINT 26 (MISCELLANEOUS) ×3 IMPLANT
CLOSURE WOUND 1/2 X4 (GAUZE/BANDAGES/DRESSINGS) ×1
COVER SURGICAL LIGHT HANDLE (MISCELLANEOUS) ×3 IMPLANT
DERMABOND ADVANCED (GAUZE/BANDAGES/DRESSINGS) ×2
DERMABOND ADVANCED .7 DNX12 (GAUZE/BANDAGES/DRESSINGS) ×1 IMPLANT
DRAPE LAPAROTOMY 100X72 PEDS (DRAPES) ×3 IMPLANT
DRSG OPSITE POSTOP 4X6 (GAUZE/BANDAGES/DRESSINGS) ×3 IMPLANT
ELECT CAUTERY BLADE 6.4 (BLADE) IMPLANT
ELECT REM PT RETURN 9FT ADLT (ELECTROSURGICAL) ×3
ELECTRODE REM PT RTRN 9FT ADLT (ELECTROSURGICAL) ×1 IMPLANT
GLOVE SURG POLY MICRO LF SZ5.5 (GLOVE) ×3 IMPLANT
GLOVE SURG UNDER POLY LF SZ6 (GLOVE) ×3 IMPLANT
GOWN STRL REUS W/ TWL LRG LVL3 (GOWN DISPOSABLE) ×2 IMPLANT
GOWN STRL REUS W/TWL LRG LVL3 (GOWN DISPOSABLE) ×4
KIT BASIN OR (CUSTOM PROCEDURE TRAY) ×3 IMPLANT
KIT TURNOVER KIT B (KITS) ×3 IMPLANT
MESH VICRYL KNITTED 12X12 (Mesh General) ×3 IMPLANT
NEEDLE HYPO 25GX1X1/2 BEV (NEEDLE) ×3 IMPLANT
NS IRRIG 1000ML POUR BTL (IV SOLUTION) ×3 IMPLANT
PACK GENERAL/GYN (CUSTOM PROCEDURE TRAY) ×3 IMPLANT
PAD ARMBOARD 7.5X6 YLW CONV (MISCELLANEOUS) ×3 IMPLANT
PENCIL SMOKE EVACUATOR (MISCELLANEOUS) ×3 IMPLANT
SPONGE T-LAP 18X18 ~~LOC~~+RFID (SPONGE) ×12 IMPLANT
STRIP CLOSURE SKIN 1/2X4 (GAUZE/BANDAGES/DRESSINGS) ×2 IMPLANT
SUT MNCRL AB 4-0 PS2 18 (SUTURE) ×3 IMPLANT
SUT NOVA 1 T20/GS 25DT (SUTURE) ×9 IMPLANT
SUT NOVA NAB DX-16 0-1 5-0 T12 (SUTURE) ×3 IMPLANT
SUT NOVA NAB GS-21 0 18 T12 DT (SUTURE) IMPLANT
SUT SILK 2 0 TIES 10X30 (SUTURE) ×3 IMPLANT
SUT VIC AB 3-0 SH 27 (SUTURE) ×4
SUT VIC AB 3-0 SH 27X BRD (SUTURE) ×2 IMPLANT
SYR CONTROL 10ML LL (SYRINGE) ×3 IMPLANT
TOWEL GREEN STERILE (TOWEL DISPOSABLE) ×3 IMPLANT
TOWEL GREEN STERILE FF (TOWEL DISPOSABLE) ×3 IMPLANT

## 2021-09-26 NOTE — H&P (Signed)
Lindsey Pierce [birth year]  [id number].    Requesting MD: Kennith Maes, PA-C Chief Complaint/Reason for Consult: incarcerated ventral hernia  HPI:  Lindsey Pierce is a 70 year old female who presented to the ED yesterday evening with increasing pain at her hernia, as well as nausea and vomiting.  She says she has had a known small hernia near the umbilicus for a while but until yesterday morning it was asymptomatic.  She then vomited and had a coughing spell, and she subsequently felt a tearing sensation at the hernia.  She says the hernia subsequently became much larger than it had been previously.  Since then she has had significant pain at the hernia as well as ongoing nausea and vomiting.  She presented to the ED yesterday afternoon.  She has been hemodynamically stable since arrival but lactate is gone up slightly to 3.9.  A CT scan confirmed the ventral hernia containing fat as well as a loop of small bowel with an associated obstruction.  General surgery was consulted.  The patient's previous abdominal surgeries include an open cholecystectomy and open appendectomy.  She has a history of coronary artery disease and had a STEMI in 2019, at which time a DES was placed.  She is still on aspirin and Brilinta, and her last Brilinta dose was yesterday morning.  Her last echo was in October 2019, several months after her STEMI, at which time her EF had improved to 60 to 65%.  ROS: Review of Systems  Constitutional:  Negative for chills and fever.  HENT:  Negative for sore throat.   Eyes:  Negative for redness.  Respiratory:  Negative for shortness of breath and wheezing.   Cardiovascular:  Negative for chest pain.  Gastrointestinal:  Positive for abdominal pain, nausea and vomiting.  Skin:  Negative for rash.  Neurological:  Negative for loss of consciousness.   Family History  Problem Relation Age of Onset   Valvular heart disease Mother     Past Medical History:  Diagnosis  Date   CAD (coronary artery disease)    a. 05/2018 Ant STEMI/PCI: LM nl, LAD 43m(2.75 x 16 Synergy DES), D2 99 (PTCA), RI 40ost, LCX mild diff dzs, OM1/2/3 nl, RCA large, diffuse dzs, RPDA nl, RPAV 60, RPLB1 80 (small).   Diabetes ([place])    HFrEF (heart failure with reduced ejection fraction) ([place])    a. 05/2018 Echo: EF 40-45%, mod LVH, Gr2 DD, mid antsept, apical septal, apical AK. Mid-apical ant, mid infsept HK. Nl RV fxn. Triv TR. b. Echo 08/2018: mod-sev. LVH, EF 60-65%, Grade 1 DD, AV-mild calcification, mild MR, mild dialation As. Aorta.   Hyperlipidemia    Hypertension    Ischemic cardiomyopathy    a. 05/2018 Echo: EF 40-45%. b. 08/2018 Echo: EF 60-65%   PAF (paroxysmal atrial fibrillation) ([place])    a. 05/2018 at time of STEMI, converted on Amio. No OAC.   STEMI involving left anterior descending coronary artery ([place])    a. 02/3328 - complicated by CGS req IABP and VT req amiodarone.   V-tach    a. 05/2018 at time of Ant STEMI-->Amio.    Past Surgical History:  Procedure Laterality Date   CARDIAC CATHETERIZATION     CORONARY/GRAFT ACUTE MI REVASCULARIZATION N/A 05/31/2018   Procedure: Coronary/Graft Acute MI Revascularization;  Surgeon: [name], MD;  Location: [facility];  Service: Cardiovascular;  Laterality: N/A;   IABP INSERTION N/A 05/31/2018   Procedure: IABP Insertion;  Surgeon: [name], MD;  Location: [facility];  Service: Cardiovascular;  Laterality: N/A;   LEFT HEART CATH AND CORONARY ANGIOGRAPHY N/A 05/31/2018   Procedure: LEFT HEART CATH AND CORONARY ANGIOGRAPHY;  Surgeon: [name], MD;  Location: [facility];  Service: Cardiovascular;  Laterality: N/A;    Social History:  reports that she has never smoked. She has never used smokeless tobacco. She reports that she does not drink alcohol and does not use drugs.  Allergies:  Allergies  Allergen Reactions   Codeine Swelling    Mouth and tongue swelling (couldn't swallow)    Darvon [Propoxyphene Hcl] Swelling    Mouth and tongue swelling (couldn't swallow)   Penicillins Hives    Has patient had a PCN reaction causing immediate rash, facial/tongue/throat swelling, SOB or lightheadedness with hypotension: Yes Has patient had a PCN reaction causing severe rash involving mucus membranes or skin necrosis: No Has patient had a PCN reaction that required hospitalization: No Has patient had a PCN reaction occurring within the last 10 years: No If all of the above answers are "NO", then may proceed with Cephalosporin use.    (Not in a hospital admission)    Physical Exam: Blood pressure (!) 142/79, pulse 88, temperature 97.6 F (36.4 C), temperature source Oral, resp. rate 20, SpO2 95 %. General: resting in bed, appears uncomfortable Neurological: alert and oriented, no focal deficits, cranial nerves grossly in tact HEENT: normocephalic, atraumatic, oropharynx clear, no scleral icterus CV: regular rate and rhythm, extremities warm and well-perfused Respiratory: normal work of breathing on room air, symmetric chest wall expansion Abdomen: soft, nondistended, supraumbilical ventral hernia is firm and exquisitely tender to palpation with mild overlying skin erythema.  The hernia is very firm and nonreducible, and the contents of the sac are approximately 6 cm in diameter. Extremities: warm and well-perfused, no deformities, moving all extremities spontaneously Psychiatric: normal mood and affect Skin: warm and dry, no jaundice, no rashes or lesions    Results for orders placed or performed during the hospital encounter of 09/25/21 (from the past 48 hour(s))  Comprehensive metabolic panel     Status: Abnormal   Collection Time: 09/25/21  5:43 PM  Result Value Ref Range   Sodium 137 135 - 145 mmol/L   Potassium 3.6 3.5 - 5.1 mmol/L   Chloride 101 98 - 111 mmol/L   CO2 26 22 - 32 mmol/L   Glucose, Bld 176 (H) 70 - 99 mg/dL    Comment: Glucose reference range  applies only to samples taken after fasting for at least 8 hours.   BUN 18 8 - 23 mg/dL   Creatinine, Ser 0.78 0.44 - 1.00 mg/dL   Calcium 9.1 8.9 - 10.3 mg/dL   Total Protein 7.2 6.5 - 8.1 g/dL   Albumin 4.1 3.5 - 5.0 g/dL   AST 22 15 - 41 U/L   ALT 18 0 - 44 U/L   Alkaline Phosphatase 63 38 - 126 U/L   Total Bilirubin 1.0 0.3 - 1.2 mg/dL   GFR, Estimated >60 >60 mL/min    Comment: (NOTE) Calculated using the CKD-EPI Creatinine Equation (2021)    Anion gap 10 5 - 15    Comment: Performed at [facility] [address] [place], [place] [address]  Lipase, blood     Status: None   Collection Time: 09/25/21  5:43 PM  Result Value Ref Range   Lipase 36 11 - 51 U/L    Comment: Performed at [facility],  [address], [place], [place] [address]  CBC with Differential     Status: None   Collection Time: 09/25/21  5:43 PM  Result Value Ref Range   WBC 5.8 4.0 - 10.5 K/uL   RBC 4.48 3.87 - 5.11 MIL/uL   Hemoglobin 13.0 12.0 - 15.0 g/dL   HCT 39.9 36.0 - 46.0 %   MCV 89.1 80.0 - 100.0 fL   MCH 29.0 26.0 - 34.0 pg   MCHC 32.6 30.0 - 36.0 g/dL   RDW 13.5 11.5 - 15.5 %   Platelets 207 150 - 400 K/uL   nRBC 0.0 0.0 - 0.2 %   Neutrophils Relative % 79 %   Neutro Abs 4.7 1.7 - 7.7 K/uL   Lymphocytes Relative 13 %   Lymphs Abs 0.7 0.7 - 4.0 K/uL   Monocytes Relative 6 %   Monocytes Absolute 0.3 0.1 - 1.0 K/uL   Eosinophils Relative 1 %   Eosinophils Absolute 0.0 0.0 - 0.5 K/uL   Basophils Relative 1 %   Basophils Absolute 0.0 0.0 - 0.1 K/uL   Immature Granulocytes 0 %   Abs Immature Granulocytes 0.02 0.00 - 0.07 K/uL    Comment: Performed at [facility], [address], [place], [place] [address]  Lactic acid, plasma     Status: None   Collection Time: 09/25/21  5:43 PM  Result Value Ref Range   Lactic Acid, Venous 1.1 0.5 - 1.9 mmol/L    Comment: Performed at [facility] [address] [place], [place] [address]  Urinalysis, Routine w reflex  microscopic Urine, Clean Catch     Status: Abnormal   Collection Time: 09/25/21 11:44 PM  Result Value Ref Range   Color, Urine AMBER (A) YELLOW    Comment: BIOCHEMICALS MAY BE AFFECTED BY COLOR   APPearance TURBID (A) CLEAR   Specific Gravity, Urine 1.028 1.005 - 1.030   pH 5.0 5.0 - 8.0   Glucose, UA NEGATIVE NEGATIVE mg/dL   Hgb urine dipstick NEGATIVE NEGATIVE   Bilirubin Urine NEGATIVE NEGATIVE   Ketones, ur 20 (A) NEGATIVE mg/dL   Protein, ur 30 (A) NEGATIVE mg/dL   Nitrite NEGATIVE NEGATIVE   Leukocytes,Ua NEGATIVE NEGATIVE   RBC / HPF 6-10 0 - 5 RBC/hpf   WBC, UA 6-10 0 - 5 WBC/hpf   Bacteria, UA RARE (A) NONE SEEN   Squamous Epithelial / LPF 0-5 0 - 5   Mucus PRESENT    Amorphous Crystal PRESENT     Comment: Performed at [facility], [address], [place], [place] [address]  Resp Panel by RT-PCR (Flu A&B, Covid) Nasopharyngeal Swab     Status: None   Collection Time: 09/25/21 11:48 PM   Specimen: Nasopharyngeal Swab; Nasopharyngeal(NP) swabs in vial transport medium  Result Value Ref Range   SARS Coronavirus 2 by RT PCR NEGATIVE NEGATIVE    Comment: (NOTE) SARS-CoV-2 target nucleic acids are NOT DETECTED.  The SARS-CoV-2 RNA is generally detectable in upper respiratory specimens during the acute phase of infection. The lowest concentration of SARS-CoV-2 viral copies this assay can detect is 138 copies/mL. A negative result does not preclude SARS-Cov-2 infection and should not be used as the sole basis for treatment or other patient management decisions. A negative result may occur with  improper specimen collection/handling, submission of specimen other than nasopharyngeal swab, presence of viral mutation(s) within the areas targeted by this assay, and inadequate number of viral copies(<138 copies/mL). A negative  result must be combined with clinical observations, patient history, and epidemiological information. The expected result is Negative.  Fact  Sheet for Patients:  EntrepreneurPulse.com.au  Fact Sheet for Healthcare Providers:  IncredibleEmployment.be  This test is no t yet approved or cleared by the [place] FDA and  has been authorized for detection and/or diagnosis of SARS-CoV-2 by FDA under an Emergency Use Authorization (EUA). This EUA will remain  in effect (meaning this test can be used) for the duration of the COVID-19 declaration under Section 564(b)(1) of the Act, 21 U.S.C.section 360bbb-3(b)(1), unless the authorization is terminated  or revoked sooner.       Influenza A by PCR NEGATIVE NEGATIVE   Influenza B by PCR NEGATIVE NEGATIVE    Comment: (NOTE) The Xpert Xpress SARS-CoV-2/FLU/RSV plus assay is intended as an aid in the diagnosis of influenza from Nasopharyngeal swab specimens and should not be used as a sole basis for treatment. Nasal washings and aspirates are unacceptable for Xpert Xpress SARS-CoV-2/FLU/RSV testing.  Fact Sheet for Patients: EntrepreneurPulse.com.au  Fact Sheet for Healthcare Providers: IncredibleEmployment.be  This test is not yet approved or cleared by the [place] FDA and has been authorized for detection and/or diagnosis of SARS-CoV-2 by FDA under an Emergency Use Authorization (EUA). This EUA will remain in effect (meaning this test can be used) for the duration of the COVID-19 declaration under Section 564(b)(1) of the Act, 21 U.S.C. section 360bbb-3(b)(1), unless the authorization is terminated or revoked.  Performed at [facility], [place] [address] [place], [place] [address]   Lactic acid, plasma     Status: Abnormal   Collection Time: 09/26/21 12:53 AM  Result Value Ref Range   Lactic Acid, Venous 3.9 (HH) 0.5 - 1.9 mmol/L    Comment: CRITICAL RESULT CALLED TO, READ BACK BY AND VERIFIED WITH: Sharmon Revere RN 09/26/21 0226 Wiliam Ke Performed at [facility], [place] [address] [place], [place] [address]    CT Abdomen Pelvis W Contrast  Result Date: 09/26/2021 CLINICAL DATA:  Umbilical hernia, acute abdominal pain. EXAM: CT ABDOMEN AND PELVIS WITH CONTRAST TECHNIQUE: Multidetector CT imaging of the abdomen and pelvis was performed using the standard protocol following bolus administration of intravenous contrast. CONTRAST:  66mL OMNIPAQUE IOHEXOL 350 MG/ML SOLN COMPARISON:  None. FINDINGS: Lower chest: Visualized lung bases are clear. At least moderate coronary artery calcification. Global cardiac size within normal limits. Hepatobiliary: Tiny hypodensity within the right hepatic dome is too small to accurately characterize but may represent a tiny cyst in a patient without a history of malignancy. The liver is otherwise unremarkable. No intra or extrahepatic biliary ductal dilation. Status post cholecystectomy. Pancreas: Unremarkable Spleen: Unremarkable Adrenals/Urinary Tract: Adrenal glands are unremarkable. Kidneys are normal, without renal calculi, focal lesion, or hydronephrosis. Bladder is unremarkable. Stomach/Bowel: A small bowel obstruction has developed secondary to a herniated loop of small bowel within a large umbilical hernia. At least 2 points of transition are identified, best seen on sagittal image # 91/7 and the intervening loop of small bowel within the hernia sac is fluid-filled and dilated but demonstrates normal enhancement. The small bowel proximal to this point is mildly dilated in fluid-filled. Distally, the small bowel and large bowel are decompressed. The hernia sac measures 7.0 x 10.5 cm. The hernia defect measures 2.2 x 2.5 cm. The stomach, small bowel, and large bowel are otherwise unremarkable. Appendectomy has been performed. No free intraperitoneal gas or fluid. Vascular/Lymphatic: Aortic atherosclerosis. No enlarged abdominal or pelvic  lymph nodes. Reproductive: Uterus and bilateral adnexa are unremarkable. Other: The rectum is unremarkable  Musculoskeletal: Degenerative changes are seen within the lumbar spine. No lytic or blastic bone lesions are identified. No acute bone abnormality. IMPRESSION: Large umbilical hernia containing a single loop of mid small bowel resulting in a small bowel obstruction. Two points of obstruction result in a closed loop obstruction involving the segment of bowel within the hernia sac, however, this loop demonstrates normal enhancement and no significant mesenteric edema at this time. Aortic Atherosclerosis (ICD10-I70.0). Electronically Signed   By: Fidela Salisbury M.D.   On: 09/26/2021 01:50      Assessment/Plan This is a 70 year old female presenting with an incarcerated ventral hernia containing a loop of small bowel.  I discussed with the patient that I recommend emergent hernia repair as the hernia is causing a closed-loop obstruction with very high risk of small bowel strangulation if not immediately repaired.  I reviewed the details of the procedure with her, including possible need for mesh placement.  If she requires small bowel resection the hernia will be repaired primarily versus with bioabsorbable mesh.  The risk of hernia recurrence is high.  She is also at increased risk of bleeding complications given that she recently took [place], however given the urgent nature of the surgery the benefits outweigh the risks.  She agrees to proceed with surgery and will be admitted postoperatively.   Michaelle Birks, MD [facility] General, Hepatobiliary and Pancreatic Surgery 09/26/21 3:39 AM

## 2021-09-26 NOTE — Discharge Instructions (Signed)
[facility] _______[facility]  UMBILICAL OR INGUINAL HERNIA REPAIR: POST OP INSTRUCTIONS  Always review your discharge instruction sheet given to you by the facility where your surgery was performed. IF YOU HAVE DISABILITY OR FAMILY LEAVE FORMS, YOU MUST BRING THEM TO THE OFFICE FOR PROCESSING.   DO NOT GIVE THEM TO YOUR DOCTOR.  1. A  prescription for pain medication may be given to you upon discharge.  Take your pain medication as prescribed, if needed.  If narcotic pain medicine is not needed, then you may take acetaminophen (Tylenol) or ibuprofen (Advil) as needed. 2. Take your usually prescribed medications unless otherwise directed. If you need a refill on your pain medication, please contact your pharmacy.  They will contact our office to request authorization. Prescriptions will not be filled after 5 pm or on week-ends. 3. You should follow a light diet the first 24 hours after arrival home, such as soup and crackers, etc.  Be sure to include lots of fluids daily.  Resume your normal diet the day after surgery. 4.Most patients will experience some swelling and bruising around the umbilicus or in the groin and scrotum.  Ice packs and reclining will help.  Swelling and bruising can take several days to resolve.  6. It is common to experience some constipation if taking pain medication after surgery.  Increasing fluid intake and taking a stool softener (such as Colace) will usually help or prevent this problem from occurring.  A mild laxative (Milk of Magnesia or Miralax) should be taken according to package directions if there are no bowel movements after 48 hours. 7. Unless discharge instructions indicate otherwise, you may remove your bandages 24-48 hours after surgery, and you may shower at that time.  You may have steri-strips (small skin tapes) in place directly over the incision.  These strips should be left on the skin for 7-10 days.  If your surgeon used skin glue on the  incision, you may shower in 24 hours.  The glue will flake off over the next 2-3 weeks.  Any sutures or staples will be removed at the office during your follow-up visit. 8. ACTIVITIES:  You may resume regular (light) daily activities beginning the next day--such as daily self-care, walking, climbing stairs--gradually increasing activities as tolerated.  You may have sexual intercourse when it is comfortable.  Refrain from any heavy lifting or straining until approved by your doctor.  a.You may drive when you are no longer taking prescription pain medication, you can comfortably wear a seatbelt, and you can safely maneuver your car and apply brakes. b.RETURN TO WORK:   _____________________________________________  9.You should see your doctor in the office for a follow-up appointment approximately 2-3 weeks after your surgery.  Make sure that you call for this appointment within a day or two after you arrive home to insure a convenient appointment time. 10.OTHER INSTRUCTIONS: _________________________    _____________________________________  WHEN TO CALL YOUR DOCTOR: Fever over 101.0 Inability to urinate Nausea and/or vomiting Extreme swelling or bruising Continued bleeding from incision. Increased pain, redness, or drainage from the incision  The clinic staff is available to answer your questions during regular business hours.  Please don't hesitate to call and ask to speak to one of the nurses for clinical concerns.  If you have a medical emergency, go to the nearest emergency room or call 911.  A surgeon from [facility] is always on call at the hospital   [address], [address],   [place], [place]  [address] ?  [address] [address], [place], [place]   [address] [phone] ? [phone] ? FAX [phone] Web site: [email or web address]  

## 2021-09-26 NOTE — Op Note (Signed)
Date: 09/26/21  Patient: Lindsey Pierce MRN: [id number]  Preoperative Diagnosis: Incarcerated ventral hernia Postoperative Diagnosis: Same  Procedure: Open repair of incarcerated ventral hernia with absorbable mesh underlay  Surgeon: Michaelle Birks, MD  EBL: 50 mL  Anesthesia: General endotracheal  Specimens: None  Indications: Ms. Brzoska is a 70 year old female who has had an asymptomatic supraumbilical hernia for many years.  Yesterday after a coughing fit she developed sudden increased pain and enlargement of the hernia.  She presented to the ED, and the hernia cannot be reduced.  CT scan confirmed a loop of bowel within the hernia with an associated bowel obstruction.  She was brought to the OR emergently for repair.  Findings: Large amount of omentum and a loop of small bowel herniated through a small fascial defect of approximately 3 cm.  The small bowel was initially ischemic in appearance but perfusion immediately improved after opening the surrounding fascia.  Repair was performed with a Vicryl mesh underlay.  Procedure details: Informed consent was obtained in the preoperative area prior to the procedure. The patient was brought to the operating room and placed on the table in the supine position.  General anesthesia was induced and appropriate lines and drains were placed for intraoperative monitoring. Perioperative antibiotics were administered per SCIP guidelines. The abdomen was prepped and draped in the usual sterile fashion. A pre-procedure timeout was taken verifying patient identity, surgical site and procedure to be performed.  A midline skin incision was made over the palpable hernia.  The subcutaneous tissue was divided with cautery and a large hernia sac was encountered measuring approximately 10 cm in diameter.  The hernia was dissected out from the surrounding subcutaneous tissue using blunt dissection and cautery.  The hernia sac was then opened and dissected  circumferentially off the surrounding fascia and excised.  The hernia contents contained a large amount of omentum and a loop of small intestine.  The small intestine appeared very dusky.  In order to allow reduction of the hernia contents, the fascial defect was extended superiorly by about 4 cm.  On opening the fascia further, the small bowel perfusion immediately improved and the bowel became pink and healthy in appearance.  The incarcerated omentum overall appeared well perfused and viable but had several defects with active bleeding in multiple places, thus a portion of the omentum was excised.  The omentum was ligated with 2-0 silk ties prior to division with cautery.  The excised omentum and hernia sac were discarded.  The involved small bowel was again examined.  There was a very small associated mesenteric hematoma, but after several minutes the bowel remained pink and well-perfused in appearance.  The proximal small bowel was very mildly dilated but appeared well perfused.  The distal small bowel was decompressed.  The small bowel was reduced back into the abdomen.  The abdomen was irrigated with warm saline and appeared hemostatic.  Because there was inflammation of the incarcerated small bowel and omentum, the decision was made not to use a permanent mesh to repair the hernia.  The fascial defect was measured at 7 cm in length after it had been extended.  A sheet of Vicryl mesh was brought onto the field and cut to a size of 10 cm x 8 cm.  The mesh was then secured to the peritoneal surface of the fascia as an underlay using 1 Novafil sutures.  The fascia was then closed over the mesh using interrupted 1 Novafil sutures in figure-of-eight fashion.  The  subcutaneous tissue was irrigated with saline and hemostasis was achieved with cautery.  The subcutaneous tissue was closed in 2 layers with a running 3-0 Vicryl suture.  The skin was closed with a running subcuticular 4-0 Monocryl suture.  Steri-Strips  and a sterile dressing were applied.  The patient tolerated the procedure with no apparent complications.  All counts were correct x2 at the end of the procedure. The patient was extubated and taken to PACU in stable condition.  Michaelle Birks, MD 09/26/21 6:44 AM

## 2021-09-26 NOTE — Anesthesia Postprocedure Evaluation (Signed)
Anesthesia Post Note  Patient: Lindsey Pierce  Procedure(s) Performed: HERNIA REPAIR UMBILICAL ADULT (Abdomen) INSERTION OF MESH (Abdomen)     Patient location during evaluation: PACU Anesthesia Type: General Level of consciousness: awake and alert Pain management: pain level controlled Vital Signs Assessment: post-procedure vital signs reviewed and stable Respiratory status: spontaneous breathing, nonlabored ventilation, respiratory function stable and patient connected to nasal cannula oxygen Cardiovascular status: blood pressure returned to baseline and stable Postop Assessment: no apparent nausea or vomiting Anesthetic complications: no   No notable events documented.  Last Vitals:  Vitals:   09/26/21 0635 09/26/21 0650  BP: 98/63 109/65  Pulse: 93 90  Resp: 20 19  Temp:  36.9 C  SpO2: 91% 94%    Last Pain:  Vitals:   09/26/21 0650  TempSrc:   PainSc: 0-No pain                 Catalina Gravel

## 2021-09-26 NOTE — Anesthesia Procedure Notes (Signed)
Procedure Name: Intubation Date/Time: 09/26/2021 4:41 AM Performed by: Clovis Cao, CRNA Pre-anesthesia Checklist: Patient identified, Emergency Drugs available, Suction available, Patient being monitored and Timeout performed Patient Re-evaluated:Patient Re-evaluated prior to induction Oxygen Delivery Method: Circle system utilized Preoxygenation: Pre-oxygenation with 100% oxygen Induction Type: IV induction, Rapid sequence and Cricoid Pressure applied Laryngoscope Size: Miller and 2 Grade View: Grade I Tube type: Oral Tube size: 7.5 mm Number of attempts: 1 Airway Equipment and Method: Stylet Placement Confirmation: ETT inserted through vocal cords under direct vision, positive ETCO2 and breath sounds checked- equal and bilateral Secured at: 22 cm Tube secured with: Tape Dental Injury: Teeth and Oropharynx as per pre-operative assessment

## 2021-09-26 NOTE — Anesthesia Preprocedure Evaluation (Signed)
Anesthesia Evaluation  Patient identified by MRN, date of birth, ID band Patient awake    Reviewed: Allergy & Precautions, NPO status , Patient's Chart, lab work & pertinent test results, reviewed documented beta blocker date and time   Airway Mallampati: II  TM Distance: >3 FB Neck ROM: Full    Dental  (+) Dental Advisory Given, Edentulous Upper   Pulmonary neg pulmonary ROS,    Pulmonary exam normal breath sounds clear to auscultation       Cardiovascular hypertension, Pt. on home beta blockers and Pt. on medications + CAD, + Past MI, + Cardiac Stents and +CHF  Normal cardiovascular exam+ dysrhythmias Atrial Fibrillation and Ventricular Tachycardia  Rhythm:Regular Rate:Normal  Echo 09/10/18: - Left ventricle: The cavity size was normal. Wall thickness was increased in a pattern of moderate to severe LVH. Systolic function was normal. The estimated ejection fraction was in the range of 60% to 65%. Doppler parameters are consistent with abnormal left ventricular relaxation (grade 1 diastolic dysfunction). Doppler parameters are consistent with high ventricular filling pressure.  - Aortic valve: Trileaflet; mildly thickened, mildly calcified  leaflets.  - Ascending aorta: The ascending aorta was mildly dilated.  - Mitral valve: Calcified annulus. There was mild regurgitation.    Neuro/Psych negative neurological ROS     GI/Hepatic Neg liver ROS,  Incarcerated Umbilical Hernia   Endo/Other  diabetes, Type 2Obesity   Renal/GU negative Renal ROS     Musculoskeletal negative musculoskeletal ROS (+)   Abdominal   Peds  Hematology  (+) Blood dyscrasia (Brilinta), ,   Anesthesia Other Findings Day of surgery medications reviewed with the patient.  Reproductive/Obstetrics                             Anesthesia Physical Anesthesia Plan  ASA: 3 and emergent  Anesthesia Plan: General   Post-op  Pain Management:    Induction: Intravenous, Rapid sequence and Cricoid pressure planned  PONV Risk Score and Plan: 4 or greater and Dexamethasone and Ondansetron  Airway Management Planned: Oral ETT  Additional Equipment:   Intra-op Plan:   Post-operative Plan: Extubation in OR  Informed Consent: I have reviewed the patients History and Physical, chart, labs and discussed the procedure including the risks, benefits and alternatives for the proposed anesthesia with the patient or authorized representative who has indicated his/her understanding and acceptance.     Dental advisory given  Plan Discussed with: CRNA  Anesthesia Plan Comments:         Anesthesia Quick Evaluation

## 2021-09-26 NOTE — ED Notes (Signed)
Patient transported to CT 

## 2021-09-26 NOTE — Transfer of Care (Signed)
Immediate Anesthesia Transfer of Care Note  Patient: Lindsey Pierce  Procedure(s) Performed: HERNIA REPAIR UMBILICAL ADULT (Abdomen) INSERTION OF MESH (Abdomen)  Patient Location: PACU  Anesthesia Type:General  Level of Consciousness: drowsy  Airway & Oxygen Therapy: Patient Spontanous Breathing and Patient connected to face mask oxygen  Post-op Assessment: Report given to RN and Post -op Vital signs reviewed and stable  Post vital signs: Reviewed and stable  Last Vitals:  Vitals Value Taken Time  BP 82/59 09/26/21 0618  Temp    Pulse 90 09/26/21 0619  Resp 23 09/26/21 0619  SpO2 91 % 09/26/21 0619  Vitals shown include unvalidated device data.  Last Pain:  Vitals:   09/26/21 0343  TempSrc: Oral  PainSc:          Complications: No notable events documented.

## 2021-09-26 NOTE — ED Notes (Signed)
Pt states pain is 4/10 at this time. Pt does not want anymore pain medicine at this time. Will let us know if she starts hurting badly again.

## 2021-09-27 ENCOUNTER — Encounter (HOSPITAL_COMMUNITY): Payer: Self-pay | Admitting: Surgery

## 2021-09-27 DIAGNOSIS — R109 Unspecified abdominal pain: Secondary | ICD-10-CM | POA: Diagnosis not present

## 2021-09-27 DIAGNOSIS — K436 Other and unspecified ventral hernia with obstruction, without gangrene: Secondary | ICD-10-CM | POA: Diagnosis not present

## 2021-09-27 LAB — CBC
HCT: 32.6 % — ABNORMAL LOW (ref 36.0–46.0)
Hemoglobin: 10.6 g/dL — ABNORMAL LOW (ref 12.0–15.0)
MCH: 29.1 pg (ref 26.0–34.0)
MCHC: 32.5 g/dL (ref 30.0–36.0)
MCV: 89.6 fL (ref 80.0–100.0)
Platelets: 171 10*3/uL (ref 150–400)
RBC: 3.64 MIL/uL — ABNORMAL LOW (ref 3.87–5.11)
RDW: 13.8 % (ref 11.5–15.5)
WBC: 7.8 10*3/uL (ref 4.0–10.5)
nRBC: 0 % (ref 0.0–0.2)

## 2021-09-27 LAB — BASIC METABOLIC PANEL
Anion gap: 7 (ref 5–15)
BUN: 16 mg/dL (ref 8–23)
CO2: 27 mmol/L (ref 22–32)
Calcium: 8.2 mg/dL — ABNORMAL LOW (ref 8.9–10.3)
Chloride: 102 mmol/L (ref 98–111)
Creatinine, Ser: 0.8 mg/dL (ref 0.44–1.00)
GFR, Estimated: >60 mL/min (ref >60)
Glucose, Bld: 115 mg/dL — ABNORMAL HIGH (ref 70–99)
Potassium: 3.6 mmol/L (ref 3.5–5.1)
Sodium: 136 mmol/L (ref 135–145)

## 2021-09-27 LAB — GLUCOSE, CAPILLARY
Glucose-Capillary: 157 mg/dL — ABNORMAL HIGH (ref 70–99)
Glucose-Capillary: 113 mg/dL — ABNORMAL HIGH (ref 70–99)

## 2021-09-27 LAB — HEMOGLOBIN A1C
Hgb A1c MFr Bld: 5.9 % — ABNORMAL HIGH (ref 4.8–5.6)
Mean Plasma Glucose: 122.63 mg/dL

## 2021-09-27 MED ORDER — TICAGRELOR 60 MG PO TABS
60.0000 mg | ORAL_TABLET | Freq: Two times a day (BID) | ORAL | Status: DC
  Filled 2021-09-27 (×2): qty 1

## 2021-09-27 MED ORDER — ACETAMINOPHEN 500 MG PO TABS
1000.0000 mg | ORAL_TABLET | Freq: Three times a day (TID) | ORAL | Status: DC | PRN
Start: 2021-09-27 — End: 2022-07-03

## 2021-09-27 MED ORDER — METHOCARBAMOL 500 MG PO TABS: 500.0000 mg | ORAL_TABLET | Freq: Three times a day (TID) | ORAL | 0 refills | Status: AC | PRN

## 2021-09-27 MED ORDER — DOCUSATE SODIUM 100 MG PO CAPS: 100.0000 mg | ORAL_CAPSULE | Freq: Every day | ORAL | Status: DC | PRN

## 2021-09-27 MED ORDER — TRAMADOL HCL 50 MG PO TABS: 50.0000 mg | ORAL_TABLET | Freq: Four times a day (QID) | ORAL | 0 refills | Status: AC | PRN

## 2021-09-27 NOTE — Discharge Summary (Signed)
[facility] Discharge Summary   Patient ID: Lindsey Pierce MRN: [id number] DOB/AGE: 1951/07/09 70 y.o.  Admit date: 09/25/2021 Discharge date: 09/27/2021  Admitting Diagnosis: Incarcerated hernia [K46.0] Status post surgery [Z98.890] Incarcerated ventral hernia [K43.6]  Discharge Diagnosis Incarcerated hernia [K46.0] Status post surgery [Z98.890] Incarcerated ventral hernia [K43.6] S/p ventral hernia repair Hypertension History of STEMI CAD  Consultants None  Imaging: CT Abdomen Pelvis W Contrast  Result Date: 09/26/2021 CLINICAL DATA:  Umbilical hernia, acute abdominal pain. EXAM: CT ABDOMEN AND PELVIS WITH CONTRAST TECHNIQUE: Multidetector CT imaging of the abdomen and pelvis was performed using the standard protocol following bolus administration of intravenous contrast. CONTRAST:  15mL OMNIPAQUE IOHEXOL 350 MG/ML SOLN COMPARISON:  None. FINDINGS: Lower chest: Visualized lung bases are clear. At least moderate coronary artery calcification. Global cardiac size within normal limits. Hepatobiliary: Tiny hypodensity within the right hepatic dome is too small to accurately characterize but may represent a tiny cyst in a patient without a history of malignancy. The liver is otherwise unremarkable. No intra or extrahepatic biliary ductal dilation. Status post cholecystectomy. Pancreas: Unremarkable Spleen: Unremarkable Adrenals/Urinary Tract: Adrenal glands are unremarkable. Kidneys are normal, without renal calculi, focal lesion, or hydronephrosis. Bladder is unremarkable. Stomach/Bowel: A small bowel obstruction has developed secondary to a herniated loop of small bowel within a large umbilical hernia. At least 2 points of transition are identified, best seen on sagittal image # 91/7 and the intervening loop of small bowel within the hernia sac is fluid-filled and dilated but demonstrates normal enhancement. The small bowel proximal to this point is mildly dilated in  fluid-filled. Distally, the small bowel and large bowel are decompressed. The hernia sac measures 7.0 x 10.5 cm. The hernia defect measures 2.2 x 2.5 cm. The stomach, small bowel, and large bowel are otherwise unremarkable. Appendectomy has been performed. No free intraperitoneal gas or fluid. Vascular/Lymphatic: Aortic atherosclerosis. No enlarged abdominal or pelvic lymph nodes. Reproductive: Uterus and bilateral adnexa are unremarkable. Other: The rectum is unremarkable Musculoskeletal: Degenerative changes are seen within the lumbar spine. No lytic or blastic bone lesions are identified. No acute bone abnormality. IMPRESSION: Large umbilical hernia containing a single loop of mid small bowel resulting in a small bowel obstruction. Two points of obstruction result in a closed loop obstruction involving the segment of bowel within the hernia sac, however, this loop demonstrates normal enhancement and no significant mesenteric edema at this time. Aortic Atherosclerosis (ICD10-I70.0). Electronically Signed   By: Fidela Salisbury M.D.   On: 09/26/2021 01:50    Procedures Dr. Zenia Resides (09/26/21) - open repair of incarcerated ventral hernia with absorbable mesh overlay   Hospital Course:  70 year old female who presented to [facility] ED with  increasing pain at her hernia, as well as nausea and vomiting.  Workup showed  an incarcerated ventral hernia containing a loop of small bowel.  Patient was admitted and underwent procedure listed above.  Tolerated procedure well and was transferred to the floor.  Diet was advanced as tolerated.  On POD1, the patient was voiding well, tolerating diet, ambulating well, pain well controlled, vital signs stable, incisions c/d/i and felt stable for discharge home.  Patient will follow up in our office in 3 weeks and knows to call with questions or concerns.   She was discharged with tramadol #20/0 and robaxin to alternate with tylenol for pain control.  Discharge instructions and return  precautions discussed and all questions answered  Physical Exam: General:  Alert, NAD, pleasant, comfortable CV:  regular rate and rhythm. Palpable radial pulses Pulm: CTAB. Nonlabored breathing on room air Abd:  Soft, ND, mild tenderness, incision C/D/I - honeycomb dressing in place with steristrips visible. No erythema or discharge. +BS MSK: bilateral upper and lower extremities without edema. MAE. No calf TTP bilaterally Skin: warm and dry. No rashes Psych: A&Ox3 with appropriate affect  I or a member of my team have reviewed this patient in the Controlled Substance Database.  Allergies as of 09/27/2021       Reactions   Codeine Swelling   Mouth and tongue swelling (couldn't swallow)   Darvon [propoxyphene Hcl] Swelling   Mouth and tongue swelling (couldn't swallow)   Penicillins Hives   Has patient had a PCN reaction causing immediate rash, facial/tongue/throat swelling, SOB or lightheadedness with hypotension: Yes Has patient had a PCN reaction causing severe rash involving mucus membranes or skin necrosis: No Has patient had a PCN reaction that required hospitalization: No Has patient had a PCN reaction occurring within the last 10 years: No If all of the above answers are "NO", then may proceed with Cephalosporin use.        Medication List     TAKE these medications    acetaminophen 500 MG tablet Commonly known as: TYLENOL Take 2 tablets (1,000 mg total) by mouth every 8 (eight) hours as needed for mild pain or moderate pain.   aspirin EC 81 MG tablet Take 81 mg by mouth daily.   atorvastatin 80 MG tablet Commonly known as: LIPITOR TAKE 1 TABLET BY MOUTH EVERY DAY AT 6PM What changed:  how much to take how to take this when to take this additional instructions   carvedilol 6.25 MG tablet Commonly known as: COREG Take 1 tablet (6.25 mg total) by mouth 2 (two) times daily.   docusate sodium 100 MG capsule Commonly known as: COLACE Take 1 capsule (100 mg  total) by mouth daily as needed for mild constipation (until having normal daily bowel movements).   losartan 25 MG tablet Commonly known as: COZAAR Take 1 tablet (25 mg total) by mouth daily.   methocarbamol 500 MG tablet Commonly known as: ROBAXIN Take 1 tablet (500 mg total) by mouth every 8 (eight) hours as needed for up to 3 days for muscle spasms (abdominal pain).   multivitamin with minerals Tabs tablet Take 1 tablet by mouth daily.   nitroGLYCERIN 0.4 MG SL tablet Commonly known as: Nitrostat Place 1 tablet (0.4 mg total) under the tongue every 5 (five) minutes as needed. What changed: reasons to take this   ticagrelor 60 MG Tabs tablet Commonly known as: Brilinta Take 1 tablet (60 mg total) by mouth 2 (two) times daily.   traMADol 50 MG tablet Commonly known as: ULTRAM Take 1 tablet (50 mg total) by mouth every 6 (six) hours as needed for up to 5 days for moderate pain or severe pain.   vitamin C 500 MG tablet Commonly known as: ASCORBIC ACID Take 500 mg by mouth daily.          Follow-up Information     Dwan Bolt, MD. Go on 10/17/2021.   Specialty: General Surgery Why: Your appointment is 12/5 at 10:30am Please arrive 30 minutes prior to your appointment to check in and fill out paperwork. Bring photo ID and insurance information. Contact information: [place]. [address] [place] [address] [phone]                 Signed: Beckie Busing  Britt Boozer [facility] 09/27/2021, 8:28 AM Please see Amion for pager number during day hours 7:00am-4:30pm

## 2021-09-27 NOTE — Progress Notes (Signed)
Discharge instructions given to patient. Patient verbalizes understanding. Discharge delayed to pt feeling dizzy. PA informed after resting patient felt ready to go. Okay'd with md. Further discharge instructions given to patient. Iv removed, Patient discharged

## 2021-11-15 ENCOUNTER — Other Ambulatory Visit: Payer: Self-pay

## 2021-11-15 MED ORDER — CARVEDILOL 6.25 MG PO TABS: 6.2500 mg | ORAL_TABLET | Freq: Two times a day (BID) | ORAL | 1 refills | Status: DC

## 2021-11-21 ENCOUNTER — Other Ambulatory Visit: Payer: PPO

## 2021-12-21 ENCOUNTER — Other Ambulatory Visit: Payer: Self-pay

## 2021-12-21 MED ORDER — LOSARTAN POTASSIUM 25 MG PO TABS: 25.0000 mg | ORAL_TABLET | Freq: Every day | ORAL | 1 refills | Status: DC

## 2022-01-09 NOTE — Telephone Encounter (Signed)
Please call patient for [facility] for new PCP

## 2022-01-30 ENCOUNTER — Other Ambulatory Visit: Payer: Self-pay

## 2022-01-30 ENCOUNTER — Telehealth: Payer: Self-pay

## 2022-01-30 ENCOUNTER — Ambulatory Visit (INDEPENDENT_AMBULATORY_CARE_PROVIDER_SITE_OTHER): Payer: PPO

## 2022-01-30 VITALS — Ht 60.0 in | Wt 194.0 lb

## 2022-01-30 DIAGNOSIS — Z0000 Encounter for general adult medical examination without abnormal findings: Secondary | ICD-10-CM

## 2022-01-30 NOTE — Telephone Encounter (Signed)
Hello Lindsey Pierce,  ?Lindsey Pierce completed her AWV this morning with me. Pt states that Clarene Reamer, FNP is no longer her provider and she has not been assigned a new one. Can you help her with this? ?Also, who would l need to assign the patient's wellness visit to, for co-sign? Thank you! ?

## 2022-01-30 NOTE — Telephone Encounter (Signed)
Hello Lindsey Pierce,  ?Ms. Myhand completed her AWV this morning with me. Pt states that Clarene Reamer, FNP is no longer her provider and she has not been assigned a new one. Can you help her with this? ?Also, who would l need to assign the patient's wellness visit to, for co-sign? Thank you! ?

## 2022-01-30 NOTE — Patient Instructions (Signed)
Ms. Stuller , ?Thank you for taking time to come for your Medicare Wellness Visit. I appreciate your ongoing commitment to your health goals. Please review the following plan we discussed and let me know if I can assist you in the future.  ? ?Screening recommendations/referrals: ?Colonoscopy: Cologuard, complete at your convenience.  ?Mammogram: Done 06/27/2021 Repeat annually ? ?Bone Density: Done 12/20/2018 Repeat every 2 years ? ?Recommended yearly ophthalmology/optometry visit for glaucoma screening and checkup ?Recommended yearly dental visit for hygiene and checkup ? ?Vaccinations: ?Influenza vaccine: Done 09/15/2021 ?Pneumococcal vaccine: Done 11/18/2018, 04/28/2020 ?Tdap vaccine: Due Repeat in 10 years ? ?Shingles vaccine: Discussed   ?Covid-19:Done 06/05/21, 10/06/20, 01/21/20, 12/26/19.  ? ?Advanced directives: Please bring a copy of your health care power of attorney and living will to the office to be added to your chart at your convenience. ? ? ?Conditions/risks identified: KEEP UP THE GOOD WORK!! ? ?Next appointment: Follow up in one year for your annual wellness visit 2024. ? ? ?Preventive Care 60 Years and Older, Female ?Preventive care refers to lifestyle choices and visits with your health care provider that can promote health and wellness. ?What does preventive care include? ?A yearly physical exam. This is also called an annual well check. ?Dental exams once or twice a year. ?Routine eye exams. Ask your health care provider how often you should have your eyes checked. ?Personal lifestyle choices, including: ?Daily care of your teeth and gums. ?Regular physical activity. ?Eating a healthy diet. ?Avoiding tobacco and drug use. ?Limiting alcohol use. ?Practicing safe sex. ?Taking low-dose aspirin every day. ?Taking vitamin and mineral supplements as recommended by your health care provider. ?What happens during an annual well check? ?The services and screenings done by your health care provider during your  annual well check will depend on your age, overall health, lifestyle risk factors, and family history of disease. ?Counseling  ?Your health care provider may ask you questions about your: ?Alcohol use. ?Tobacco use. ?Drug use. ?Emotional well-being. ?Home and relationship well-being. ?Sexual activity. ?Eating habits. ?History of falls. ?Memory and ability to understand (cognition). ?Work and work Statistician. ?Reproductive health. ?Screening  ?You may have the following tests or measurements: ?Height, weight, and BMI. ?Blood pressure. ?Lipid and cholesterol levels. These may be checked every 5 years, or more frequently if you are over 46 years old. ?Skin check. ?Lung cancer screening. You may have this screening every year starting at age 75 if you have a 30-pack-year history of smoking and currently smoke or have quit within the past 15 years. ?Fecal occult blood test (FOBT) of the stool. You may have this test every year starting at age 9. ?Flexible sigmoidoscopy or colonoscopy. You may have a sigmoidoscopy every 5 years or a colonoscopy every 10 years starting at age 74. ?Hepatitis C blood test. ?Hepatitis B blood test. ?Sexually transmitted disease (STD) testing. ?Diabetes screening. This is done by checking your blood sugar (glucose) after you have not eaten for a while (fasting). You may have this done every 1-3 years. ?Bone density scan. This is done to screen for osteoporosis. You may have this done starting at age 20. ?Mammogram. This may be done every 1-2 years. Talk to your health care provider about how often you should have regular mammograms. ?Talk with your health care provider about your test results, treatment options, and if necessary, the need for more tests. ?Vaccines  ?Your health care provider may recommend certain vaccines, such as: ?Influenza vaccine. This is recommended every year. ?Tetanus, diphtheria, and  acellular pertussis (Tdap, Td) vaccine. You may need a Td booster every 10  years. ?Zoster vaccine. You may need this after age 34. ?Pneumococcal 13-valent conjugate (PCV13) vaccine. One dose is recommended after age 53. ?Pneumococcal polysaccharide (PPSV23) vaccine. One dose is recommended after age 29. ?Talk to your health care provider about which screenings and vaccines you need and how often you need them. ?This information is not intended to replace advice given to you by your health care provider. Make sure you discuss any questions you have with your health care provider. ?Document Released: 11/26/2015 Document Revised: 07/19/2016 Document Reviewed: 08/31/2015 ?Elsevier Interactive Patient Education ? 2017 [place]. ? ?Fall Prevention in the Home ?Falls can cause injuries. They can happen to people of all ages. There are many things you can do to make your home safe and to help prevent falls. ?What can I do on the outside of my home? ?Regularly fix the edges of walkways and driveways and fix any cracks. ?Remove anything that might make you trip as you walk through a door, such as a raised step or threshold. ?Trim any bushes or trees on the path to your home. ?Use bright outdoor lighting. ?Clear any walking paths of anything that might make someone trip, such as rocks or tools. ?Regularly check to see if handrails are loose or broken. Make sure that both sides of any steps have handrails. ?Any raised decks and porches should have guardrails on the edges. ?Have any leaves, snow, or ice cleared regularly. ?Use sand or salt on walking paths during winter. ?Clean up any spills in your garage right away. This includes oil or grease spills. ?What can I do in the bathroom? ?Use night lights. ?Install grab bars by the toilet and in the tub and shower. Do not use towel bars as grab bars. ?Use non-skid mats or decals in the tub or shower. ?If you need to sit down in the shower, use a plastic, non-slip stool. ?Keep the floor dry. Clean up any water that spills on the floor as soon as it  happens. ?Remove soap buildup in the tub or shower regularly. ?Attach bath mats securely with double-sided non-slip rug tape. ?Do not have throw rugs and other things on the floor that can make you trip. ?What can I do in the bedroom? ?Use night lights. ?Make sure that you have a light by your bed that is easy to reach. ?Do not use any sheets or blankets that are too big for your bed. They should not hang down onto the floor. ?Have a firm chair that has side arms. You can use this for support while you get dressed. ?Do not have throw rugs and other things on the floor that can make you trip. ?What can I do in the kitchen? ?Clean up any spills right away. ?Avoid walking on wet floors. ?Keep items that you use a lot in easy-to-reach places. ?If you need to reach something above you, use a strong step stool that has a grab bar. ?Keep electrical cords out of the way. ?Do not use floor polish or wax that makes floors slippery. If you must use wax, use non-skid floor wax. ?Do not have throw rugs and other things on the floor that can make you trip. ?What can I do with my stairs? ?Do not leave any items on the stairs. ?Make sure that there are handrails on both sides of the stairs and use them. Fix handrails that are broken or loose. Make sure that  handrails are as long as the stairways. ?Check any carpeting to make sure that it is firmly attached to the stairs. Fix any carpet that is loose or worn. ?Avoid having throw rugs at the top or bottom of the stairs. If you do have throw rugs, attach them to the floor with carpet tape. ?Make sure that you have a light switch at the top of the stairs and the bottom of the stairs. If you do not have them, ask someone to add them for you. ?What else can I do to help prevent falls? ?Wear shoes that: ?Do not have high heels. ?Have rubber bottoms. ?Are comfortable and fit you well. ?Are closed at the toe. Do not wear sandals. ?If you use a stepladder: ?Make sure that it is fully opened.  Do not climb a closed stepladder. ?Make sure that both sides of the stepladder are locked into place. ?Ask someone to hold it for you, if possible. ?Clearly mark and make sure that you can see: ?Any grab bars or ha

## 2022-01-30 NOTE — Telephone Encounter (Signed)
PLEASE DISREGARD. MESSAGE SENT WITH [facility] AT PRACTICE. NEW MESSAGE SENT WITH [facility] AS PRACTICE. THANK YOU.  ?

## 2022-01-30 NOTE — Progress Notes (Signed)
? ?Subjective:  ? Lindsey Pierce is a 71 y.o. female who presents for Medicare Annual (Subsequent) preventive examination. ?Virtual Visit via Telephone Note ? ?I connected with  Lindsey Pierce on 01/31/22 at  8:15 AM EDT by telephone and verified that I am speaking with the correct person using two identifiers. ? ?Location: ?Patient: HOME ?Provider: [facility] ?Persons participating in the virtual visit: patient/Nurse Health Advisor ?  ?I discussed the limitations, risks, security and privacy concerns of performing an evaluation and management service by telephone and the availability of in person appointments. The patient expressed understanding and agreed to proceed. ? ?Interactive audio and video telecommunications were attempted between this nurse and patient, however failed, due to patient having technical difficulties OR patient did not have access to video capability.  We continued and completed visit with audio only. ? ?Some vital signs may be absent or patient reported.  ? ?Chriss Driver, LPN ? ?Review of Systems    ? ?Cardiac Risk Factors include: advanced age (>56mn, >>33women);diabetes mellitus;dyslipidemia;hypertension;obesity (BMI >30kg/m2) ? ?   ?Objective:  ?  ?Today's Vitals  ? 01/30/22 0816  ?Weight: 194 lb (88 kg)  ?Height: 5' (1.524 m)  ? ?Body mass index is 37.89 kg/m?. ? ?Advanced Directives 01/30/2022 09/26/2021 09/26/2021 04/26/2020 07/04/2018 06/02/2018  ?Does Patient Have a Medical Advance Directive? Yes - No No Yes No  ?Type of AParamedicof ALennoxLiving will - - - HPress photographer-  ?Does patient want to make changes to medical advance directive? - - - - No - Patient declined -  ?Copy of [place] AFBin Chart? No - copy requested - - - No - copy requested -  ?Would patient like information on creating a medical advance directive? - No - Patient declined - No - Patient declined - No - Patient declined  ? ? ?Current  Medications (verified) ?Outpatient Encounter Medications as of 01/30/2022  ?Medication Sig  ? acetaminophen (TYLENOL) 500 MG tablet Take 2 tablets (1,000 mg total) by mouth every 8 (eight) hours as needed for mild pain or moderate pain.  ? aspirin EC 81 MG tablet Take 81 mg by mouth daily.  ? atorvastatin (LIPITOR) 80 MG tablet TAKE 1 TABLET BY MOUTH EVERY DAY AT 6PM (Patient taking differently: Take 80 mg by mouth at bedtime.)  ? carvedilol (COREG) 6.25 MG tablet Take 1 tablet (6.25 mg total) by mouth 2 (two) times daily.  ? docusate sodium (COLACE) 100 MG capsule Take 1 capsule (100 mg total) by mouth daily as needed for mild constipation (until having normal daily bowel movements).  ? FLUZONE HIGH-DOSE QUADRIVALENT 0.7 ML SUSY   ? losartan (COZAAR) 25 MG tablet Take 1 tablet (25 mg total) by mouth daily.  ? Multiple Vitamin (MULTIVITAMIN WITH MINERALS) TABS Take 1 tablet by mouth daily.  ? nitroGLYCERIN (NITROSTAT) 0.4 MG SL tablet Place 1 tablet (0.4 mg total) under the tongue every 5 (five) minutes as needed. (Patient taking differently: Place 0.4 mg under the tongue every 5 (five) minutes as needed for chest pain.)  ? ticagrelor (BRILINTA) 60 MG TABS tablet Take 1 tablet (60 mg total) by mouth 2 (two) times daily.  ? vitamin C (ASCORBIC ACID) 500 MG tablet Take 500 mg by mouth daily.  ? ?No facility-administered encounter medications on file as of 01/30/2022.  ? ? ?Allergies (verified) ?Codeine, Darvon [propoxyphene hcl], and Penicillins  ? ?History: ?Past Medical History:  ?Diagnosis Date  ?  CAD (coronary artery disease)   ? a. 05/2018 Ant STEMI/PCI: LM nl, LAD 44m2.75 x 16 Synergy DES), D2 99 (PTCA), RI 40ost, LCX mild diff dzs, OM1/2/3 nl, RCA large, diffuse dzs, RPDA nl, RPAV 60, RPLB1 80 (small).  ? Diabetes ([place]   ? HFrEF (heart failure with reduced ejection fraction) ([place]   ? a. 05/2018 Echo: EF 40-45%, mod LVH, Gr2 DD, mid antsept, apical septal, apical AK. Mid-apical ant, mid infsept HK. Nl RV fxn.  Triv TR. b. Echo 08/2018: mod-sev. LVH, EF 60-65%, Grade 1 DD, AV-mild calcification, mild MR, mild dialation As. Aorta.  ? Hyperlipidemia   ? Hypertension   ? Ischemic cardiomyopathy   ? a. 05/2018 Echo: EF 40-45%. b. 08/2018 Echo: EF 60-65%  ? PAF (paroxysmal atrial fibrillation) ([place]   ? a. 05/2018 at time of STEMI, converted on Amio. No OAC.  ? STEMI involving left anterior descending coronary artery ([place]   ? a. 79/7353- complicated by CGS req IABP and VT req amiodarone.  ? V-tach   ? a. 05/2018 at time of Ant STEMI-->Amio.  ? ?Past Surgical History:  ?Procedure Laterality Date  ? APPENDECTOMY    ? CARDIAC CATHETERIZATION    ? CHOLECYSTECTOMY OPEN    ? CORONARY/GRAFT ACUTE MI REVASCULARIZATION N/A 05/31/2018  ? Procedure: Coronary/Graft Acute MI Revascularization;  Surgeon: ENelva Bush MD;  [place] [facility];  Service: Cardiovascular;  Laterality: N/A;  ? IABP INSERTION N/A 05/31/2018  ? Procedure: IABP Insertion;  Surgeon: ENelva Bush MD;  [place] [facility];  Service: Cardiovascular;  Laterality: N/A;  ? INSERTION OF MESH N/A 09/26/2021  ? Procedure: INSERTION OF MESH;  Surgeon: ADwan Bolt MD;  [place]  Service: General;  Laterality: N/A;  ? LEFT HEART CATH AND CORONARY ANGIOGRAPHY N/A 05/31/2018  ? Procedure: LEFT HEART CATH AND CORONARY ANGIOGRAPHY;  Surgeon: ENelva Bush MD;  [place];  Service: Cardiovascular;  Laterality: N/A;  ? UMBILICAL HERNIA REPAIR N/A 09/26/2021  ? Procedure: HERNIA REPAIR UMBILICAL ADULT;  Surgeon: ADwan Bolt MD;  [place]  Service: General;  Laterality: N/A;  ? ?Family History  ?Problem Relation Age of Onset  ? Valvular heart disease Mother   ? ?Social History  ? ?Socioeconomic History  ? Marital status: Divorced  ?  Spouse name: Not on file  ? Number of children: Not on file  ? Years of education: Not on file  ? Highest education level: Not on file  ?Occupational History  ? Not on file  ?Tobacco  Use  ? Smoking status: Never  ? Smokeless tobacco: Never  ?Vaping Use  ? Vaping Use: Never used  ?Substance and Sexual Activity  ? Alcohol use: No  ? Drug use: No  ? Sexual activity: Not on file  ?Other Topics Concern  ? Not on file  ?Social History Narrative  ? Not on file  ? ?Social Determinants of Health  ? ?Financial Resource Strain: Low Risk   ? Difficulty of Paying Living Expenses: Not hard at all  ?Food Insecurity: No Food Insecurity  ? Worried About RCharity fundraiserin the Last Year: Never true  ? Ran Out of Food in the Last Year: Never true  ?Transportation Needs: No Transportation Needs  ? Lack of Transportation (Medical): No  ? Lack of Transportation (Non-Medical): No  ?Physical Activity: Sufficiently Active  ? Days of Exercise per Week: 5 days  ? Minutes of Exercise per Session: 60  min  ?Stress: No Stress Concern Present  ? Feeling of Stress : Not at all  ?Social Connections: Moderately Isolated  ? Frequency of Communication with Friends and Family: More than three times a week  ? Frequency of Social Gatherings with Friends and Family: More than three times a week  ? Attends Religious Services: Never  ? Active Member of Clubs or Organizations: Yes  ? Attends Archivist Meetings: More than 4 times per year  ? Marital Status: Divorced  ? ? ?Tobacco Counseling ?Counseling given: Not Answered ? ? ?Clinical Intake: ? ?Pre-visit preparation completed: Yes ? ?Pain : No/denies pain ? ?  ? ?BMI - recorded: 37.89 ?Nutritional Status: BMI > 30  Obese ?Nutritional Risks: None ?Diabetes: No ? ?How often do you need to have someone help you when you read instructions, pamphlets, or other written materials from your doctor or pharmacy?: 1 - Never ? ?Diabetic? Pt states she is not currently on medication or checking blood sugars.  ? ?Interpreter Needed?: No ? ?Information entered by :: mj Vega Stare, lpn ? ? ?Activities of Daily Living ?In your present state of health, do you have any difficulty performing the  following activities: 01/30/2022 09/26/2021  ?Hearing? N N  ?Vision? N Y  ?Difficulty concentrating or making decisions? N N  ?Walking or climbing stairs? N N  ?Dressing or bathing? N N  ?Doing errands,

## 2022-02-01 NOTE — Telephone Encounter (Signed)
As mentioned in a prior message, other providers available that day to sign off would be Dr. Waunita Schooner, Dr. Elsie Stain, Dr. Ria Bush, any of those providers would be happy to sign off on the visit.  ? ? ?I will forward this to our front office pool to reach out to patient and set her up with a transfer of care appointment with one of our providers accepting new patients.  ? ?Thank you for flagging this for [place].  ? ? ?

## 2022-02-06 NOTE — Telephone Encounter (Signed)
Left voice message to call the office to scahedule [facility] appointment ?

## 2022-02-08 ENCOUNTER — Other Ambulatory Visit: Payer: Self-pay | Admitting: Internal Medicine

## 2022-02-08 ENCOUNTER — Other Ambulatory Visit: Payer: Self-pay

## 2022-02-08 DIAGNOSIS — E785 Hyperlipidemia, unspecified: Secondary | ICD-10-CM

## 2022-02-08 MED ORDER — TICAGRELOR 60 MG PO TABS: 60.0000 mg | ORAL_TABLET | Freq: Two times a day (BID) | ORAL | 4 refills | Status: DC

## 2022-05-09 ENCOUNTER — Other Ambulatory Visit: Payer: Self-pay | Admitting: Internal Medicine

## 2022-05-13 ENCOUNTER — Other Ambulatory Visit: Payer: Self-pay | Admitting: Internal Medicine

## 2022-05-13 DIAGNOSIS — E785 Hyperlipidemia, unspecified: Secondary | ICD-10-CM

## 2022-05-15 NOTE — Telephone Encounter (Signed)
Please schedule 12 month F/U appt for 90 day refills. Thank you! 

## 2022-06-10 ENCOUNTER — Other Ambulatory Visit: Payer: Self-pay | Admitting: Internal Medicine

## 2022-06-10 DIAGNOSIS — E785 Hyperlipidemia, unspecified: Secondary | ICD-10-CM

## 2022-07-02 NOTE — Progress Notes (Unsigned)
Cardiology Office Note:    Date:  07/03/2022   ID:  Lindsey Pierce, DOB 1951/10/17, MRN [id number]  PCP:  Elby Beck, FNP  [facility] Cardiologist:  [name], MD  [facility] Electrophysiologist:  None   Referring MD: Elby Beck, FNP   Chief Complaint: 12 month follow-up  History of Present Illness:    Lindsey Pierce is a 71 y.o. female with a hx of with anterior STEMI complicated by cardiogenic shock (LVEF 40 to 45% at time of MI; 44 to 65% on repeat echo 08/2018), atrial fibrillation, sustained VT in immediate post MI, hypertension, hyperlipidemia, and who presents today for 1 year follow-up.   Prior to her MI, she reports having compromised vision while driving, after which time she pulled over and called 911.  She denies ever experiencing any chest pain or shortness of breath.  After she had no recurrent atrial fibrillation or VT post MI, her anticoagulation and amiodarone were subsequently discontinued.  Last seen 06/2021 and was doing well from a cardiac perspective.   Today, the patient overall is doing well. She had a surgery last November for incarcerated ventral hernia. BP is good today, although she says it's higher than normal.  She wants to continue taking Aspirin and Brilinta. She is switching PCP, and PCP will do labs. She denies chest pain or SOB. NO LLE, orthopnea, pnd, lightheadedness, dizziness, or palpitations. She is walking 3-4 daily. Diet is good, getting better. We will send in refills today.   Past Medical History:  Diagnosis Date   CAD (coronary artery disease)    a. 05/2018 Ant STEMI/PCI: LM nl, LAD 16m2.75 x 16 Synergy DES), D2 99 (PTCA), RI 40ost, LCX mild diff dzs, OM1/2/3 nl, RCA large, diffuse dzs, RPDA nl, RPAV 60, RPLB1 80 (small).   Diabetes ([place]    HFrEF (heart failure with reduced ejection fraction) ([place]    a. 05/2018 Echo: EF 40-45%, mod LVH, Gr2 DD, mid antsept, apical septal, apical AK. Mid-apical ant, mid  infsept HK. Nl RV fxn. Triv TR. b. Echo 08/2018: mod-sev. LVH, EF 60-65%, Grade 1 DD, AV-mild calcification, mild MR, mild dialation As. Aorta.   Hyperlipidemia    Hypertension    Ischemic cardiomyopathy    a. 05/2018 Echo: EF 40-45%. b. 08/2018 Echo: EF 60-65%   PAF (paroxysmal atrial fibrillation) ([place]    a. 05/2018 at time of STEMI, converted on Amio. No OAC.   STEMI involving left anterior descending coronary artery ([place]    a. 72/9528- complicated by CGS req IABP and VT req amiodarone.   V-tach ([facility]    a. 05/2018 at time of Ant STEMI-->Amio.    Past Surgical History:  Procedure Laterality Date   APPENDECTOMY     CARDIAC CATHETERIZATION     CHOLECYSTECTOMY OPEN     CORONARY/GRAFT ACUTE MI REVASCULARIZATION N/A 05/31/2018   Procedure: Coronary/Graft Acute MI Revascularization;  Surgeon: ENelva Bush MD;  [facility];  Service: Cardiovascular;  Laterality: N/A;   IABP INSERTION N/A 05/31/2018   Procedure: IABP Insertion;  Surgeon: ENelva Bush MD;  [facility];  Service: Cardiovascular;  Laterality: N/A;   INSERTION OF MESH N/A 09/26/2021   Procedure: INSERTION OF MESH;  Surgeon: ADwan Bolt MD;  [place]  Service: General;  Laterality: N/A;   LEFT HEART CATH AND CORONARY ANGIOGRAPHY N/A 05/31/2018   Procedure: LEFT HEART CATH AND CORONARY ANGIOGRAPHY;  Surgeon: ENelva Bush MD;  [facility];  Service: Cardiovascular;  Laterality: N/A;   UMBILICAL HERNIA REPAIR N/A 09/26/2021   Procedure: HERNIA REPAIR UMBILICAL ADULT;  Surgeon: Dwan Bolt, MD;  [facility];  Service: General;  Laterality: N/A;    Current Medications: Current Meds  Medication Sig   aspirin EC 81 MG tablet Take 81 mg by mouth daily.   Multiple Vitamin (MULTIVITAMIN WITH MINERALS) TABS Take 1 tablet by mouth daily.   ticagrelor (BRILINTA) 60 MG TABS tablet Take 1 tablet (60 mg total) by mouth 2 (two) times daily.   vitamin C  (ASCORBIC ACID) 500 MG tablet Take 500 mg by mouth daily.   [DISCONTINUED] atorvastatin (LIPITOR) 80 MG tablet TAKE 1 TABLET BY MOUTH EVERY DAY AT 6PM.   [DISCONTINUED] carvedilol (COREG) 6.25 MG tablet TAKE 1 TABLET BY MOUTH TWICE A DAY   [DISCONTINUED] losartan (COZAAR) 25 MG tablet TAKE 1 TABLET (25 MG TOTAL) BY MOUTH DAILY.     Allergies:   Codeine, Darvon [propoxyphene hcl], and Penicillins   Social History   Socioeconomic History   Marital status: Divorced    Spouse name: Not on file   Number of children: Not on file   Years of education: Not on file   Highest education level: Not on file  Occupational History   Not on file  Tobacco Use   Smoking status: Never   Smokeless tobacco: Never  Vaping Use   Vaping Use: Never used  Substance and Sexual Activity   Alcohol use: Yes    Alcohol/week: 1.0 standard drink of alcohol    Types: 1 Glasses of wine per week    Comment: Occasional glass of wine   Drug use: No   Sexual activity: Not on file  Other Topics Concern   Not on file  Social History Narrative   Not on file   Social Determinants of Health   Financial Resource Strain: Low Risk  (01/30/2022)   Overall Financial Resource Strain (CARDIA)    Difficulty of Paying Living Expenses: Not hard at all  Food Insecurity: No Food Insecurity (01/30/2022)   Hunger Vital Sign    Worried About Running Out of Food in the Last Year: Never true    Ran Out of Food in the Last Year: Never true  Transportation Needs: No Transportation Needs (01/30/2022)   PRAPARE - Hydrologist (Medical): No    Lack of Transportation (Non-Medical): No  Physical Activity: Sufficiently Active (01/30/2022)   Exercise Vital Sign    Days of Exercise per Week: 5 days    Minutes of Exercise per Session: 60 min  Stress: No Stress Concern Present (01/30/2022)   [place]    Feeling of Stress : Not at all  Social  Connections: Moderately Isolated (01/30/2022)   Social Connection and Isolation Panel [NHANES]    Frequency of Communication with Friends and Family: More than three times a week    Frequency of Social Gatherings with Friends and Family: More than three times a week    Attends Religious Services: Never    Marine scientist or Organizations: Yes    Attends Music therapist: More than 4 times per year    Marital Status: Divorced     Family History: The patient's family history includes Valvular heart disease in her mother.  ROS:   Please see the history of present illness.     All other systems reviewed and  are negative.  EKGs/Labs/Other Studies Reviewed:    The following studies were reviewed today:  Echo 08/2018 Study Conclusions   - Left ventricle: The cavity size was normal. Wall thickness was    increased in a pattern of moderate to severe LVH. Systolic    function was normal. The estimated ejection fraction was in the    range of 60% to 65%. Doppler parameters are consistent with    abnormal left ventricular relaxation (grade 1 diastolic    dysfunction). Doppler parameters are consistent with high    ventricular filling pressure.  - Aortic valve: Trileaflet; mildly thickened, mildly calcified    leaflets.  - Ascending aorta: The ascending aorta was mildly dilated.  - Mitral valve: Calcified annulus. There was mild regurgitation.   Cardiac cath 05/2018 Conclusions: Anterior STEMI due to acute plaque rupture and thrombotic thrombotic subtotal occlusion of the mid LAD and D2. Moderate to severe but non-critical disease involving rPL branches. Cardiogenic shock with mildly to moderately elevated LVEDP. Successful PCI to mid LAD/D2 using Synergy 2.75 x 16 mm drug eluting stent in the LAD with 0% residual stenosis and angioplasty of the ostium of D2 with reduction of stenosis from 99% to 70%. Successful placement of 40 mL intra-aortic balloon pump via the right  femoral artery. Atrial fibrillation with rapid ventricular response with unsuccessful DCCV x 1 at 200 J.  Rate control improved with amiodarone infusion.   Recommendations: Administer ticagrelor 180 mg when possible and discontinue cangrelor 2 hours after ticagrelor administration. Titrate off norepinephrine as tolerated. Aggressive secondary prevention, including high-intensity statin therapy. Start heparin infusion 2 hours after TR band removal. Continue amiodarone infusion for rate control of atrial fibrillation.  Consider transitioning to beta-blocker once shock has resolved. CXR when patient arrives in ICU to confirm IABP placement. Wean BiPAP, as tolerated.   Recommend uninterrupted dual antiplatelet therapy with Aspirin '81mg'$  daily and Ticagrelor '90mg'$  twice daily for a minimum of 12 months (ACS - Class I recommendation).    [name], MD [facility] Pager: [phone]    EKG:  EKG is ordered today.  The ekg ordered today demonstrates NSR 77bpm, LAD, anterolateral q waves, nonspecific ST changes  Recent Labs: 09/25/2021: ALT 18 09/27/2021: BUN 16; Creatinine, Ser 0.80; Hemoglobin 10.6; Platelets 171; Potassium 3.6; Sodium 136  Recent Lipid Panel    Component Value Date/Time   CHOL 114 05/19/2021 0838   TRIG 128.0 05/19/2021 0838   HDL 29.30 (L) 05/19/2021 0838   CHOLHDL 4 05/19/2021 0838   VLDL 25.6 05/19/2021 0838   LDLCALC 59 05/19/2021 0838    Physical Exam:    VS:  BP 120/80 (BP Location: Left Arm, Patient Position: Sitting, Cuff Size: Normal)   Pulse 77   Ht '5\' 1"'$  (1.549 m)   Wt 204 lb 3.2 oz (92.6 kg)   SpO2 96%   BMI 38.58 kg/m     Wt Readings from Last 3 Encounters:  07/03/22 204 lb 3.2 oz (92.6 kg)  01/30/22 194 lb (88 kg)  09/26/21 199 lb 4.7 oz (90.4 kg)     GEN:  Well nourished, well developed in no acute distress HEENT: Normal NECK: No JVD; No carotid bruits LYMPHATICS: No lymphadenopathy CARDIAC: RRR, no murmurs, rubs,  gallops RESPIRATORY:  Clear to auscultation without rales, wheezing or rhonchi  ABDOMEN: Soft, non-tender, non-distended MUSCULOSKELETAL:  No edema; No deformity  SKIN: Warm and dry NEUROLOGIC:  Alert and oriented x 3 PSYCHIATRIC:  Normal affect   ASSESSMENT:  1. Coronary artery disease involving native coronary artery of native heart without angina pectoris   2. Hyperlipidemia LDL goal <70   3. Essential hypertension   4. History of ventricular tachycardia   5. History of atrial fibrillation    PLAN:    In order of problems listed above:  CAD STEMI in 2019 s/p DES/PCI mLAD and angioplasty D2 Patient denies anginal symptoms. She walks 3-4 miles daily and has lost about 60lbs over the last few years with intermittent fasting. She would like to continue DAPT with Aspirin '81mg'$  daily and Brilinta '60mg'$  BID. She denies significant bleeding. PCP will check annual labs. Continue Coreg and Lipitor- we will refill these. No further ischemic work-up indicated.   HTN BP is good today, continue current medications. Refill Losartan and Coreg.   H/o PAF/NSVT In the setting of STEMI in 2019. Anticoagulation and amiodarone have subsequently been discontinued and there has been no reoccurrence. Patient denies palpitations. Continue BB therapy.   HLD LDL  59 in 05/2021. Continue Lipitor '80mg'$  daily.   Elevated A1C Elevated to 6.7 in the past. PCP will follow.    Disposition: Follow up in 1 year(s) with MD/APP    Signed, Jesika Men Ninfa Meeker, PA-C  07/03/2022 8:43 AM    [facility]

## 2022-07-03 ENCOUNTER — Encounter: Payer: Self-pay | Admitting: Medical

## 2022-07-03 ENCOUNTER — Ambulatory Visit: Payer: PPO | Admitting: Medical

## 2022-07-03 VITALS — BP 120/80 | HR 77 | Ht 61.0 in | Wt 204.2 lb

## 2022-07-03 DIAGNOSIS — E785 Hyperlipidemia, unspecified: Secondary | ICD-10-CM

## 2022-07-03 DIAGNOSIS — Z8679 Personal history of other diseases of the circulatory system: Secondary | ICD-10-CM | POA: Diagnosis not present

## 2022-07-03 DIAGNOSIS — I10 Essential (primary) hypertension: Secondary | ICD-10-CM

## 2022-07-03 DIAGNOSIS — I2510 Atherosclerotic heart disease of native coronary artery without angina pectoris: Secondary | ICD-10-CM | POA: Diagnosis not present

## 2022-07-03 MED ORDER — CARVEDILOL 6.25 MG PO TABS: 6.2500 mg | ORAL_TABLET | Freq: Two times a day (BID) | ORAL | 3 refills | Status: DC

## 2022-07-03 MED ORDER — LOSARTAN POTASSIUM 25 MG PO TABS: 25.0000 mg | ORAL_TABLET | Freq: Every day | ORAL | 3 refills | Status: DC

## 2022-07-03 MED ORDER — ATORVASTATIN CALCIUM 80 MG PO TABS: ORAL_TABLET | ORAL | 3 refills | Status: DC

## 2022-07-03 NOTE — Patient Instructions (Signed)
Medication Instructions:   Your physician recommends that you continue on your current medications as directed. Please refer to the Current Medication list given to you today.   *If you need a refill on your cardiac medications before your next appointment, please call your pharmacy*   Lab Work: None ordered  If you have labs (blood work) drawn today and your tests are completely normal, you will receive your results only by: MyChart Message (if you have MyChart) OR A paper copy in the mail If you have any lab test that is abnormal or we need to change your treatment, we will call you to review the results.   Testing/Procedures: None ordered   Follow-Up: At [facility], you and your health needs are our priority.  As part of our continuing mission to provide you with exceptional heart care, we have created designated Provider Care Teams.  These Care Teams include your primary Cardiologist (physician) and Advanced Practice Providers (APPs -  Physician Assistants and Nurse Practitioners) who all work together to provide you with the care you need, when you need it.  We recommend signing up for the patient portal called "MyChart".  Sign up information is provided on this After Visit Summary.  MyChart is used to connect with patients for Virtual Visits (Telemedicine).  Patients are able to view lab/test results, encounter notes, upcoming appointments, etc.  Non-urgent messages can be sent to your provider as well.   To learn more about what you can do with MyChart, go to [email or web address].    Your next appointment:    Your physician wants you to follow-up in: 1 year.   You will receive a reminder letter in the mail two months in advance. If you don't receive a letter, please call our office to schedule the follow-up appointment.   The format for your next appointment:   In Person  Provider:   You may see Christopher End, MD or one of the following Advanced Practice Providers  on your designated Care Team:   Christopher Berge, NP Ryan Dunn, PA-C Cadence Furth, PA-C   Other Instructions N/A  Important Information About Sugar       

## 2022-07-17 ENCOUNTER — Other Ambulatory Visit: Payer: Self-pay | Admitting: Internal Medicine

## 2022-10-04 ENCOUNTER — Other Ambulatory Visit: Payer: Self-pay | Admitting: Medical

## 2022-10-04 DIAGNOSIS — E785 Hyperlipidemia, unspecified: Secondary | ICD-10-CM

## 2023-03-25 ENCOUNTER — Emergency Department (HOSPITAL_COMMUNITY)
Admission: EM | Admit: 2023-03-25 | Discharge: 2023-03-25 | Disposition: A | Discharge status: Home / Self Care | Payer: PPO | Attending: Emergency Medicine | Admitting: Emergency Medicine

## 2023-03-25 ENCOUNTER — Encounter (HOSPITAL_COMMUNITY): Payer: Self-pay

## 2023-03-25 ENCOUNTER — Other Ambulatory Visit: Payer: Self-pay

## 2023-03-25 ENCOUNTER — Emergency Department (HOSPITAL_COMMUNITY): Payer: PPO

## 2023-03-25 DIAGNOSIS — Z7901 Long term (current) use of anticoagulants: Secondary | ICD-10-CM | POA: Insufficient documentation

## 2023-03-25 DIAGNOSIS — I4891 Unspecified atrial fibrillation: Secondary | ICD-10-CM | POA: Diagnosis not present

## 2023-03-25 DIAGNOSIS — I2510 Atherosclerotic heart disease of native coronary artery without angina pectoris: Secondary | ICD-10-CM | POA: Diagnosis not present

## 2023-03-25 DIAGNOSIS — R000 Tachycardia, unspecified: Secondary | ICD-10-CM | POA: Diagnosis present

## 2023-03-25 DIAGNOSIS — E119 Type 2 diabetes mellitus without complications: Secondary | ICD-10-CM | POA: Diagnosis not present

## 2023-03-25 LAB — CBC
HCT: 40.6 % (ref 36.0–46.0)
Hemoglobin: 13.3 g/dL (ref 12.0–15.0)
MCH: 28.9 pg (ref 26.0–34.0)
MCHC: 32.8 g/dL (ref 30.0–36.0)
MCV: 88.3 fL (ref 80.0–100.0)
Platelets: 216 10*3/uL (ref 150–400)
RBC: 4.6 MIL/uL (ref 3.87–5.11)
RDW: 13.5 % (ref 11.5–15.5)
WBC: 4.9 10*3/uL (ref 4.0–10.5)
nRBC: 0 % (ref 0.0–0.2)

## 2023-03-25 LAB — BASIC METABOLIC PANEL
Anion gap: 10 (ref 5–15)
BUN: 12 mg/dL (ref 8–23)
CO2: 23 mmol/L (ref 22–32)
Calcium: 8.6 mg/dL — ABNORMAL LOW (ref 8.9–10.3)
Chloride: 106 mmol/L (ref 98–111)
Creatinine, Ser: 0.81 mg/dL (ref 0.44–1.00)
GFR, Estimated: >60 mL/min (ref >60)
Glucose, Bld: 155 mg/dL — ABNORMAL HIGH (ref 70–99)
Potassium: 3.6 mmol/L (ref 3.5–5.1)
Sodium: 139 mmol/L (ref 135–145)

## 2023-03-25 LAB — MAGNESIUM: Magnesium: 2.1 mg/dL (ref 1.7–2.4)

## 2023-03-25 LAB — TSH: TSH: 1.984 u[IU]/mL (ref 0.350–4.500)

## 2023-03-25 MED ORDER — CARVEDILOL 6.25 MG PO TABS: 12.5000 mg | ORAL_TABLET | Freq: Two times a day (BID) | ORAL | 3 refills | Status: DC

## 2023-03-25 MED ORDER — APIXABAN 5 MG PO TABS: 5.0000 mg | ORAL_TABLET | Freq: Two times a day (BID) | ORAL | 2 refills | Status: DC

## 2023-03-25 NOTE — Discharge Instructions (Addendum)
You had an episode of atrial fibrillation with rapid ventricular response today.  Cardiology recommendations are as follows: -Increase your carvedilol dose to 12.5 mg twice a day. -Start taking Eliquis.  This is a blood thinner.  Prescription was sent to your pharmacy. -Stop taking aspirin for now. -Continue taking your Brilinta for now. -Follow-up with your cardiologist soon as possible.  A referral has been sent.  If you do not hear from them by tomorrow, call the number below to set up that follow-up appointment.  Please return to the emergency department for any further episodes.

## 2023-03-25 NOTE — ED Triage Notes (Signed)
Patient presents from home via ems for c/o tachycardia. States she was doing laundry and had an onset of palpitations and dizziness. Waited 30 minutes and called EMS. Patient found to be in A-fib with RVR, rate 150-180 on ems arrival. Converted to NSR with rate of 75 after 10mg  diltiazem. Denies hx of afib.

## 2023-03-25 NOTE — ED Provider Notes (Signed)
[facility] Provider Note   CSN: [id number] Arrival date & time: 03/25/23  1411     History  Chief Complaint  Patient presents with   Tachycardia    Lindsey Pierce is a 72 y.o. female.  HPI Patient presents for palpitations and dizziness.  Medical history includes HLD, paroxysmal atrial fibrillation, CAD, T2DM.  Patient reports that she was in her normal state of health earlier today.  Shortly prior to arrival, she was loading her washing machine.  She experienced palpitations and dizziness.  She allowed 30 minutes for it to subside but it did not.  She called EMS.  EMS noted irregularly irregular tachycardia ranging from 130s to 180.  She was given 10 mg of Cardizem and subsequently had conversion to normal sinus rhythm with a heart rate in the 70s.  Currently, she is asymptomatic.  She denies any known history of prior episodes of atrial fibrillation.  Per chart review, she had an episode of atrial fibrillation during 2019 STEMI.  She was previously on amiodarone which was discontinued.  She is currently on a beta-blocker.    Home Medications Prior to Admission medications   Medication Sig Start Date End Date Taking? Authorizing Provider  apixaban (ELIQUIS) 5 MG TABS tablet Take 1 tablet (5 mg total) by mouth 2 (two) times daily. 03/25/23 06/23/23 Yes Gloris Manchester, MD  atorvastatin (LIPITOR) 80 MG tablet TAKE 1 TABLET BY MOUTH EVERY DAY AT 6PM 10/04/22   Furth, Cadence H, PA-C  BRILINTA 60 MG TABS tablet TAKE 1 TABLET BY MOUTH 2 TIMES DAILY. 07/18/22   End, Cristal Deer, MD  carvedilol (COREG) 6.25 MG tablet Take 2 tablets (12.5 mg total) by mouth 2 (two) times daily. 03/25/23   Gloris Manchester, MD  losartan (COZAAR) 25 MG tablet Take 1 tablet (25 mg total) by mouth daily. 07/03/22   Furth, Cadence H, PA-C  Multiple Vitamin (MULTIVITAMIN WITH MINERALS) TABS Take 1 tablet by mouth daily.    [provider]  nitroGLYCERIN (NITROSTAT) 0.4 MG  SL tablet Place 1 tablet (0.4 mg total) under the tongue every 5 (five) minutes as needed. Patient not taking: Reported on 07/03/2022 06/06/18   Laverda Page B, NP  vitamin C (ASCORBIC ACID) 500 MG tablet Take 500 mg by mouth daily.    [provider]      Allergies    Codeine, Darvon [propoxyphene hcl], and Penicillins    Review of Systems   Review of Systems  Cardiovascular:  Positive for palpitations.  Neurological:  Positive for light-headedness.  All other systems reviewed and are negative.   Physical Exam Updated Vital Signs BP 121/81   Pulse 68   Temp 97.9 F (36.6 C) (Oral)   Resp (!) 22   Ht 5\' 1"  (1.549 m)   Wt 95.3 kg   SpO2 100%   BMI 39.68 kg/m  Physical Exam Vitals and nursing note reviewed.  Constitutional:      General: She is not in acute distress.    Appearance: Normal appearance. She is well-developed. She is not ill-appearing, toxic-appearing or diaphoretic.  HENT:     Head: Normocephalic and atraumatic.     Right Ear: External ear normal.     Left Ear: External ear normal.     Nose: Nose normal.     Mouth/Throat:     Mouth: Mucous membranes are moist.  Eyes:     Conjunctiva/sclera: Conjunctivae normal.  Cardiovascular:     Rate and Rhythm:  Normal rate and regular rhythm.  Pulmonary:     Effort: Pulmonary effort is normal. No respiratory distress.  Abdominal:     General: There is no distension.     Palpations: Abdomen is soft.  Musculoskeletal:        General: No swelling. Normal range of motion.     Cervical back: Neck supple.  Skin:    General: Skin is warm and dry.     Coloration: Skin is not jaundiced or pale.  Neurological:     General: No focal deficit present.     Mental Status: She is alert and oriented to person, place, and time.     Cranial Nerves: No cranial nerve deficit.     Sensory: No sensory deficit.     Motor: No weakness.     Coordination: Coordination normal.  Psychiatric:        Mood and Affect: Mood  normal.        Behavior: Behavior normal.     ED Results / Procedures / Treatments   Labs (all labs ordered are listed, but only abnormal results are displayed) Labs Reviewed  BASIC METABOLIC PANEL - Abnormal; Notable for the following components:      Result Value   Glucose, Bld 155 (*)    Calcium 8.6 (*)    All other components within normal limits  MAGNESIUM  CBC  TSH    EKG EKG Interpretation  Date/Time:  Sunday Mar 25 2023 14:41:03 EDT Ventricular Rate:  70 PR Interval:  181 QRS Duration: 90 QT Interval:  427 QTC Calculation: 461 R Axis:   -49 Text Interpretation: Sinus rhythm Confirmed by Gloris Manchester [id number]) on 03/25/2023 3:49:39 PM  Radiology DG Chest Port 1 View  Result Date: 03/25/2023 CLINICAL DATA:  Palpitations EXAM: PORTABLE CHEST 1 VIEW COMPARISON:  June 01, 2018 FINDINGS: Stable cardiomegaly. The heart, hila, mediastinum, lungs, and pleura are otherwise normal. IMPRESSION: No active disease. Electronically Signed   By: Gerome Sam III M.D.   On: 03/25/2023 15:22    Procedures Procedures    Medications Ordered in ED Medications - No data to display  ED Course/ Medical Decision Making/ A&P                             Medical Decision Making Amount and/or Complexity of Data Reviewed Labs: ordered. Radiology: ordered.  Risk Prescription drug management.   This patient presents to the ED for concern of palpitations, this involves an extensive number of treatment options, and is a complaint that carries with it a high risk of complications and morbidity.  The differential diagnosis includes arrhythmia, anxiety   Co morbidities that complicate the patient evaluation  HLD, paroxysmal atrial fibrillation, CAD, T2DM   Additional history obtained:  Additional history obtained from EMS External records from outside source obtained and reviewed including EMR   Lab Tests:  I Ordered, and personally interpreted labs.  The pertinent results  include: Normal hemoglobin, no leukocytosis, normal kidney function, normal electrolytes   Imaging Studies ordered:  I ordered imaging studies including chest x-ray I independently visualized and interpreted imaging which showed no acute findings I agree with the radiologist interpretation   Cardiac Monitoring: / EKG:  The patient was maintained on a cardiac monitor.  I personally viewed and interpreted the cardiac monitored which showed an underlying rhythm of: Sinus rhythm   Consultations Obtained:  I requested consultation with the cardiologist, Dr. Tenny Craw,  and  discussed lab and imaging findings as well as pertinent plan - they recommend: Increasing Coreg dose to 12.5 twice daily, initiation of Eliquis, stopping aspirin, close cards follow-up   Problem List / ED Course / Critical interventions / Medication management  Patient presents for a transient episode of atrial fibrillation with RVR, subsequently converted to normal sinus rhythm after 10 mg dose of Cardizem with EMS.  She is well-appearing on arrival.  Heart rate and blood pressure are normal.  Patient was placed on bedside cardiac monitor.  Laboratory workup was initiated.  Lab work shows normal electrolytes.  Patient made in normal sinus rhythm while in the ED.  I spoke with cardiology who recommendations detailed above.  Patient was discharged in stable condition.  Social Determinants of Health:  Has access to outpatient care        Final Clinical Impression(s) / ED Diagnoses Final diagnoses:  Atrial fibrillation with RVR (HCC)    Rx / DC Orders ED Discharge Orders          Ordered    Amb referral to AFIB Clinic  Status:  Canceled        03/25/23 1439    Ambulatory referral to Cardiology       Comments: If you have not heard from the Cardiology office within the next 72 hours please call [phone].   03/25/23 1805    apixaban (ELIQUIS) 5 MG TABS tablet  2 times daily        03/25/23 1806    carvedilol  (COREG) 6.25 MG tablet  2 times daily       Note to Pharmacy: Pt needs to contact office to schedule future appointment and refills, Thank you. [phone]   03/25/23 1807              Gloris Manchester, MD 03/25/23 [phone]

## 2023-03-26 ENCOUNTER — Encounter: Payer: Self-pay | Admitting: Internal Medicine

## 2023-03-26 NOTE — Telephone Encounter (Signed)
Error

## 2023-03-29 ENCOUNTER — Encounter: Payer: Self-pay | Admitting: Medical

## 2023-03-29 ENCOUNTER — Ambulatory Visit: Payer: PPO | Attending: Medical | Admitting: Medical

## 2023-03-29 VITALS — BP 140/90 | HR 63 | Ht 61.0 in | Wt 216.0 lb

## 2023-03-29 DIAGNOSIS — E782 Mixed hyperlipidemia: Secondary | ICD-10-CM

## 2023-03-29 DIAGNOSIS — I480 Paroxysmal atrial fibrillation: Secondary | ICD-10-CM

## 2023-03-29 DIAGNOSIS — I10 Essential (primary) hypertension: Secondary | ICD-10-CM | POA: Diagnosis not present

## 2023-03-29 DIAGNOSIS — I2510 Atherosclerotic heart disease of native coronary artery without angina pectoris: Secondary | ICD-10-CM

## 2023-03-29 NOTE — Progress Notes (Signed)
Cardiology Office Note:    Date:  03/29/2023   ID:  Lindsey Pierce, DOB 05/14/1951, MRN [id number]  PCP:  Emi Belfast, FNP  [facility] Cardiologist:  Yvonne Kendall, MD  [facility] Electrophysiologist:  None   Referring MD: Emi Belfast, FNP   Chief Complaint: ER follow-up  History of Present Illness:    Lindsey Pierce is a 72 y.o. female with a hx of anterior STEMI complicated by cardiogenic shock (LVEF 40 to 45% at time of MI; 65 to 65% on repeat echo 08/2018), atrial fibrillation, sustained VT in immediate post MI, hypertension, hyperlipidemia, and who presents today for 1 year follow-up.   Patient has a history of CAD with anterior STEMI complicated by cardiogenic shock, A-fib, sustained VT in the immediate post MI, hypertension and hyperlipidemia.  Prior to her MI, she reports having compromised vision while driving, after which time she pulled over and called 911.  She denies ever experiencing any chest pain or shortness of breath.  After she had no recurrent atrial fibrillation or VT post MI, her anticoagulation and amiodarone were subsequently discontinued.   Patient was seen in the ED Mar 25, 2023 reporting palpitations and dizziness.  EMS was called, who noted tachycardia/afib with rates 130-180s given 10 mg of Cardizem with conversion to normal sinus rhythm.  By the time patient got to the ER she was feeling basically back to normal. Labs were unremarkable.  Patient was started on Eliquis and Coreg was increased.  Aspirin was stopped.  Patient was discharged in stable condition.  Today, the patient is in normal sinus rhythm on EKG.  Reports she has had a lot stress at home. She denies any recurrent palpitations or dizziness. She denies any bleeding issues on Eliquis. She denies chest pain, SOB, LLE. She is still taking Brilinta. She is taking coreg 12.5mg  daily. The patient tries to walk 3-5 miles a day. She needs to do better with her diet.   Past  Medical History:  Diagnosis Date   CAD (coronary artery disease)    a. 05/2018 Ant STEMI/PCI: LM nl, LAD 30m(2.75 x 16 Synergy DES), D2 99 (PTCA), RI 40ost, LCX mild diff dzs, OM1/2/3 nl, RCA large, diffuse dzs, RPDA nl, RPAV 60, RPLB1 80 (small).   Diabetes (HCC)    HFrEF (heart failure with reduced ejection fraction) (HCC)    a. 05/2018 Echo: EF 40-45%, mod LVH, Gr2 DD, mid antsept, apical septal, apical AK. Mid-apical ant, mid infsept HK. Nl RV fxn. Triv TR. b. Echo 08/2018: mod-sev. LVH, EF 60-65%, Grade 1 DD, AV-mild calcification, mild MR, mild dialation As. Aorta.   Hyperlipidemia    Hypertension    Ischemic cardiomyopathy    a. 05/2018 Echo: EF 40-45%. b. 08/2018 Echo: EF 60-65%   PAF (paroxysmal atrial fibrillation) (HCC)    a. 05/2018 at time of STEMI, converted on Amio. No OAC.   STEMI involving left anterior descending coronary artery (HCC)    a. 05/2018 - complicated by CGS req IABP and VT req amiodarone.   V-tach [facility])    a. 05/2018 at time of Ant STEMI-->Amio.    Past Surgical History:  Procedure Laterality Date   APPENDECTOMY     CARDIAC CATHETERIZATION     CHOLECYSTECTOMY OPEN     CORONARY/GRAFT ACUTE MI REVASCULARIZATION N/A 05/31/2018   Procedure: Coronary/Graft Acute MI Revascularization;  Surgeon: Yvonne Kendall, MD;  Location: [facility] INV[facility] LAB;  Service: Cardiovascular;  Laterality: N/A;   IABP INSERTION N/A 05/31/2018  Procedure: IABP Insertion;  Surgeon: Yvonne Kendall, MD;  Location: [facility] INVAS[facility] CV LAB;  Service: Cardiovascular;  Laterality: N/A;   INSERTION OF MESH N/A 09/26/2021   Procedure: INSERTION OF MESH;  Surgeon: Fritzi Mandes, MD;  Location: [facility] OR;  Service: General;  Laterality: N/A;   LEFT HEART CATH AND CORONARY ANGIOGRAPHY N/A 05/31/2018   Procedure: LEFT HEART CATH AND CORONARY ANGIOGRAPHY;  Surgeon: Yvonne Kendall, MD;  Location: [facility] INVASIVE CV LAB;  Service: Cardiovascular;  Laterality: N/A;   UMBILICAL HERNIA REPAIR N/A 09/26/2021    Procedure: HERNIA REPAIR UMBILICAL ADULT;  Surgeon: Fritzi Mandes, MD;  Location: [facility] OR;  Service: General;  Laterality: N/A;    Current Medications: Current Meds  Medication Sig   apixaban (ELIQUIS) 5 MG TABS tablet Take 1 tablet (5 mg total) by mouth 2 (two) times daily.   atorvastatin (LIPITOR) 80 MG tablet TAKE 1 TABLET BY MOUTH EVERY DAY AT 6PM   carvedilol (COREG) 6.25 MG tablet Take 2 tablets (12.5 mg total) by mouth 2 (two) times daily.   losartan (COZAAR) 25 MG tablet Take 1 tablet (25 mg total) by mouth daily.   Multiple Vitamin (MULTIVITAMIN WITH MINERALS) TABS Take 1 tablet by mouth daily.   nitroGLYCERIN (NITROSTAT) 0.4 MG SL tablet Place 1 tablet (0.4 mg total) under the tongue every 5 (five) minutes as needed.   [DISCONTINUED] BRILINTA 60 MG TABS tablet TAKE 1 TABLET BY MOUTH 2 TIMES DAILY.     Allergies:   Codeine, Darvon [propoxyphene hcl], and Penicillins   Social History   Socioeconomic History   Marital status: Divorced    Spouse name: Not on file   Number of children: Not on file   Years of education: Not on file   Highest education level: Not on file  Occupational History   Not on file  Tobacco Use   Smoking status: Never   Smokeless tobacco: Never  Vaping Use   Vaping Use: Never used  Substance and Sexual Activity   Alcohol use: Not Currently    Alcohol/week: 1.0 standard drink of alcohol    Types: 1 Glasses of wine per week    Comment: Occasional glass of wine   Drug use: No   Sexual activity: Not on file  Other Topics Concern   Not on file  Social History Narrative   Not on file   Social Determinants of Health   Financial Resource Strain: Low Risk  (01/30/2022)   Overall Financial Resource Strain (CARDIA)    Difficulty of Paying Living Expenses: Not hard at all  Food Insecurity: No Food Insecurity (01/30/2022)   Hunger Vital Sign    Worried About Running Out of Food in the Last Year: Never true    Ran Out of Food in the Last Year: Never  true  Transportation Needs: No Transportation Needs (01/30/2022)   PRAPARE - Administrator, Civil Service (Medical): No    Lack of Transportation (Non-Medical): No  Physical Activity: Sufficiently Active (01/30/2022)   Exercise Vital Sign    Days of Exercise per Week: 5 days    Minutes of Exercise per Session: 60 min  Stress: No Stress Concern Present (01/30/2022)   [employer] of Occupational Health - Occupational Stress Questionnaire    Feeling of Stress : Not at all  Social Connections: Moderately Isolated (01/30/2022)   Social Connection and Isolation Panel [NHANES]    Frequency of Communication with Friends and Family: More than three times a week  Frequency of Social Gatherings with Friends and Family: More than three times a week    Attends Religious Services: Never    Database administrator or Organizations: Yes    Attends Engineer, structural: More than 4 times per year    Marital Status: Divorced     Family History: The patient's family history includes Valvular heart disease in her mother.  ROS:   Please see the history of present illness.     All other systems reviewed and are negative.  EKGs/Labs/Other Studies Reviewed:    The following studies were reviewed today:  Echo 08/2018 Study Conclusions   - Left ventricle: The cavity size was normal. Wall thickness was    increased in a pattern of moderate to severe LVH. Systolic    function was normal. The estimated ejection fraction was in the    range of 60% to 65%. Doppler parameters are consistent with    abnormal left ventricular relaxation (grade 1 diastolic    dysfunction). Doppler parameters are consistent with high    ventricular filling pressure.  - Aortic valve: Trileaflet; mildly thickened, mildly calcified    leaflets.  - Ascending aorta: The ascending aorta was mildly dilated.  - Mitral valve: Calcified annulus. There was mild regurgitation.   Echo 05/2018 Study Conclusions    - Left ventricle: The cavity size was normal. Wall thickness was    increased in a pattern of moderate LVH. There was severe focal    basal hypertrophy of the septum. Systolic function was mildly to    moderately reduced. The estimated ejection fraction was in the    range of 40% to 45%. Features are consistent with a pseudonormal    left ventricular filling pattern, with concomitant abnormal    relaxation and increased filling pressure (grade 2 diastolic    dysfunction). The outflow tract showed peak gradient of 23 mmHg    with Valsalva.  - Regional wall motion abnormality: Akinesis of the mid    anteroseptal, apical septal, and apical myocardium; hypokinesis    of the mid-apical anterior and mid inferoseptal myocardium. This    abnormality is consistent with ischemia or infarction.  - Aorta: Balloon pump present.  - Mitral valve: Calcified annulus. There was no systolic anterior    motion. There was mild regurgitation.  - Right ventricle: Systolic function was normal.  - Tricuspid valve: There was trivial regurgitation.  - Pulmonic valve: There was no significant regurgitation.   Impressions:   - Significant wall motion abnormalities, consistent with infarction    in LAD territory. Presence of balloon pump noted. Septal    hypertrophy with peak gradient of 23 mmHg in LVOT.    Cardiac cath 05/2018 Conclusions: Anterior STEMI due to acute plaque rupture and thrombotic thrombotic subtotal occlusion of the mid LAD and D2. Moderate to severe but non-critical disease involving rPL branches. Cardiogenic shock with mildly to moderately elevated LVEDP. Successful PCI to mid LAD/D2 using Synergy 2.75 x 16 mm drug eluting stent in the LAD with 0% residual stenosis and angioplasty of the ostium of D2 with reduction of stenosis from 99% to 70%. Successful placement of 40 mL intra-aortic balloon pump via the right femoral artery. Atrial fibrillation with rapid ventricular response with  unsuccessful DCCV x 1 at 200 J.  Rate control improved with amiodarone infusion.   Recommendations: Administer ticagrelor 180 mg when possible and discontinue cangrelor 2 hours after ticagrelor administration. Titrate off norepinephrine as tolerated. Aggressive secondary prevention, including high-intensity  statin therapy. Start heparin infusion 2 hours after TR band removal. Continue amiodarone infusion for rate control of atrial fibrillation.  Consider transitioning to beta-blocker once shock has resolved. CXR when patient arrives in ICU to confirm IABP placement. Wean BiPAP, as tolerated.   Recommend uninterrupted dual antiplatelet therapy with Aspirin 81mg  daily and Ticagrelor 90mg  twice daily for a minimum of 12 months (ACS - Class I recommendation).    Yvonne Kendall, MD [facility] Pager: [phone]   Indications  ST elevation myocardial infarction involving left anterior descending (LAD) coronary artery (HCC) [I21.02 (ICD-10-CM)]  Cardiogenic shock (HCC) [R57.0 (ICD-10-CM)]    EKG:  EKG is ordered today.  The ekg ordered today demonstrates NSR 63bpm, LAD, septal and inferior Q waves.  Recent Labs: 03/25/2023: BUN 12; Creatinine, Ser 0.81; Hemoglobin 13.3; Magnesium 2.1; Platelets 216; Potassium 3.6; Sodium 139; TSH 1.984  Recent Lipid Panel    Component Value Date/Time   CHOL 114 05/19/2021 0838   TRIG 128.0 05/19/2021 0838   HDL 29.30 (L) 05/19/2021 0838   CHOLHDL 4 05/19/2021 0838   VLDL 25.6 05/19/2021 0838   LDLCALC 59 05/19/2021 0838      Physical Exam:    VS:  BP (!) 140/90 (BP Location: Left Arm, Patient Position: Sitting, Cuff Size: Normal)   Pulse 63   Ht 5\' 1"  (1.549 m)   Wt 216 lb (98 kg)   SpO2 98%   BMI 40.81 kg/m     Wt Readings from Last 3 Encounters:  03/29/23 216 lb (98 kg)  03/25/23 210 lb (95.3 kg)  07/03/22 204 lb 3.2 oz (92.6 kg)     GEN:  Well nourished, well developed in no acute distress HEENT: Normal NECK: No JVD; No  carotid bruits LYMPHATICS: No lymphadenopathy CARDIAC: RRR, no murmurs, rubs, gallops RESPIRATORY:  Clear to auscultation without rales, wheezing or rhonchi  ABDOMEN: Soft, non-tender, non-distended MUSCULOSKELETAL:  No edema; No deformity  SKIN: Warm and dry NEUROLOGIC:  Alert and oriented x 3 PSYCHIATRIC:  Normal affect   ASSESSMENT:    1. Paroxysmal atrial fibrillation (HCC)   2. Essential hypertension   3. Coronary artery disease involving native coronary artery of native heart without angina pectoris   4. Hyperlipidemia, mixed    PLAN:    In order of problems listed above:  Paroxysmal Afib Patient had episode of A-fib after STEMI in 2019 and more recently she had episode of A-fib with RVR treated with IV Dilt with conversion to normal sinus rhythm by EMS.  By the time she got to the ED patient was feeling much better and in NSR. Labs were unremarkable.  Patient was started on Eliquis, and Coreg was increased.  Today, patient is in normal sinus rhythm on EKG.  She denies any bleeding issues on Eliquis.  I will stop Brilinta given Eliquis. Continue Coreg 12.5 mg twice daily and Eliquis 5 mg twice daily.  I will update echo today. Lifestyle changes discussed in detail today.  CAD with STEMI in 2019 Patient denies any anginal symptoms.  Stop Brilinta for Eliquis as above.  Continue beta-blocker and statin therapy.   HTN Blood pressure is mildly elevated today, however patient is nervous.  She says blood pressure generally normal at home.  Continue Coreg 12.5 mg daily and losartan 25 mg daily.  HLD LDL 59 in 2022. Patient needs updated labs. Continue Lipitor 80mg  daily.   Disposition: Follow up in 2 month(s) with MD/APP    Signed, Maxine Huynh David Stall, PA-C  03/29/2023 8:36 AM    [facility]

## 2023-03-29 NOTE — Patient Instructions (Signed)
Medication Instructions:  Your physician has recommended you make the following change in your medication:   STOP - BRILINTA 60 MG TABS tablet  *If you need a refill on your cardiac medications before your next appointment, please call your pharmacy*   Lab Work: -None ordered If you have labs (blood work) drawn today and your tests are completely normal, you will receive your results only by: MyChart Message (if you have MyChart) OR A paper copy in the mail If you have any lab test that is abnormal or we need to change your treatment, we will call you to review the results.   Testing/Procedures: Your physician has requested that you have an echocardiogram. Echocardiography is a painless test that uses sound waves to create images of your heart. It provides your doctor with information about the size and shape of your heart and how well your heart's chambers and valves are working. This procedure takes approximately one hour. There are no restrictions for this procedure. Please do NOT wear cologne, perfume, aftershave, or lotions (deodorant is allowed). Please arrive 15 minutes prior to your appointment time.    Follow-Up: At [facility], you and your health needs are our priority.  As part of our continuing mission to provide you with exceptional heart care, we have created designated Provider Care Teams.  These Care Teams include your primary Cardiologist (physician) and Advanced Practice Providers (APPs -  Physician Assistants and Nurse Practitioners) who all work together to provide you with the care you need, when you need it.  Your next appointment:   2 month(s)  Provider:   You may see Yvonne Kendall, MD or one of the following Advanced Practice Providers on your designated Care Team:   Nicolasa Ducking, NP Eula Listen, PA-C Cadence Fransico Michael, PA-C Charlsie Quest, NP    Other Instructions -None

## 2023-04-08 ENCOUNTER — Other Ambulatory Visit: Payer: Self-pay | Admitting: Medical

## 2023-04-08 DIAGNOSIS — E785 Hyperlipidemia, unspecified: Secondary | ICD-10-CM

## 2023-05-18 ENCOUNTER — Ambulatory Visit: Payer: PPO | Attending: Medical

## 2023-05-18 DIAGNOSIS — I480 Paroxysmal atrial fibrillation: Secondary | ICD-10-CM

## 2023-05-18 LAB — ECHOCARDIOGRAM COMPLETE
AV Mean grad: 8 mmHg
AV Peak grad: 14.7 mmHg
Ao pk vel: 1.92 m/s
Area-P 1/2: 2.54 cm2
Calc EF: 54.8 %
S' Lateral: 2.2 cm
Single Plane A2C EF: 54.6 %
Single Plane A4C EF: 52.3 %

## 2023-06-21 ENCOUNTER — Other Ambulatory Visit: Payer: Self-pay | Admitting: Internal Medicine

## 2023-06-21 DIAGNOSIS — I480 Paroxysmal atrial fibrillation: Secondary | ICD-10-CM

## 2023-06-22 ENCOUNTER — Ambulatory Visit: Payer: PPO | Admitting: Medical

## 2023-06-22 NOTE — Telephone Encounter (Signed)
Prescription refill request for Eliquis received. Indication: Afib  Last office visit: 03/29/23 Fransico Michael)  Scr: 0.81 (03/25/23)  Age: 72 Weight: 98kg  Appropriate dose. Refill sent.

## 2023-06-22 NOTE — Telephone Encounter (Signed)
Refill request

## 2023-07-11 ENCOUNTER — Other Ambulatory Visit: Payer: Self-pay | Admitting: Medical

## 2023-07-27 ENCOUNTER — Ambulatory Visit: Payer: PPO | Admitting: Cardiology

## 2023-09-06 ENCOUNTER — Other Ambulatory Visit: Payer: Self-pay | Admitting: Family Medicine

## 2023-09-06 DIAGNOSIS — Z1231 Encounter for screening mammogram for malignant neoplasm of breast: Secondary | ICD-10-CM

## 2023-09-20 NOTE — Progress Notes (Signed)
Cardiology Office Note:    Date:  09/21/2023   ID:  Lindsey Pierce, DOB 1951-03-04, MRN [id number]  PCP:  Emi Belfast, FNP  [facility] Cardiologist:  Yvonne Kendall, MD  [facility] Electrophysiologist:  None   Referring MD: Emi Belfast, FNP   Chief Complaint: 6 month follow-up  History of Present Illness:    Lindsey Pierce is a 72 y.o. female with a hx of  anterior STEMI complicated by cardiogenic shock (LVEF 40 to 45% at time of MI; 11 to 65% on repeat echo 08/2018), atrial fibrillation, sustained VT in immediate post MI, hypertension, hyperlipidemia, and who presents today for follow-up.   Patient has a history of CAD with anterior STEMI complicated by cardiogenic shock, A-fib, sustained VT in the immediate post MI, hypertension and hyperlipidemia.  Prior to her MI, she reports having compromised vision while driving, after which time she pulled over and called 911.  She denies ever experiencing any chest pain or shortness of breath.  After she had no recurrent atrial fibrillation or VT post MI, her anticoagulation and amiodarone were subsequently discontinued.   Patient was seen in the ED Mar 25, 2023 reporting palpitations and dizziness.  EMS was called, who noted tachycardia/afib with rates 130-180s given 10 mg of Cardizem with conversion to normal sinus rhythm.  By the time patient got to the ER she was feeling basically back to normal. Labs were unremarkable.  Patient was started on Eliquis and Coreg was increased.  Aspirin was stopped.  Patient was discharged in stable condition.  Patient was last seen in May 2024 for ER follow-up. She was in sinus rhythm.  She reported a lot of stress at home. Brilinta was stopped given Eliquis.  Echo was updated.  Echocardiogram showed EF 55 to 60%, moderate LVH, grade 1 diastolic dysfunction, mild MR, mild dilation of the ascending aorta measuring 40 mm.  Today, the patient reports she is doing well. She denies chest  pain, SOB, palpitations, heart racing, lower leg edema, lightheadedness or dizziness. She needs refill of Coreg. She will get more labs with PCP. EKG shows NSR. She denies bleeding issues with Eliquis.   Past Medical History:  Diagnosis Date   CAD (coronary artery disease)    a. 05/2018 Ant STEMI/PCI: LM nl, LAD 82m(2.75 x 16 Synergy DES), D2 99 (PTCA), RI 40ost, LCX mild diff dzs, OM1/2/3 nl, RCA large, diffuse dzs, RPDA nl, RPAV 60, RPLB1 80 (small).   Diabetes (HCC)    HFrEF (heart failure with reduced ejection fraction) (HCC)    a. 05/2018 Echo: EF 40-45%, mod LVH, Gr2 DD, mid antsept, apical septal, apical AK. Mid-apical ant, mid infsept HK. Nl RV fxn. Triv TR. b. Echo 08/2018: mod-sev. LVH, EF 60-65%, Grade 1 DD, AV-mild calcification, mild MR, mild dialation As. Aorta.   Hyperlipidemia    Hypertension    Ischemic cardiomyopathy    a. 05/2018 Echo: EF 40-45%. b. 08/2018 Echo: EF 60-65%   PAF (paroxysmal atrial fibrillation) (HCC)    a. 05/2018 at time of STEMI, converted on Amio. No OAC.   STEMI involving left anterior descending coronary artery (HCC)    a. 05/2018 - complicated by CGS req IABP and VT req amiodarone.   V-tach [facility])    a. 05/2018 at time of Ant STEMI-->Amio.    Past Surgical History:  Procedure Laterality Date   APPENDECTOMY     CARDIAC CATHETERIZATION     CHOLECYSTECTOMY OPEN     CORONARY/GRAFT ACUTE MI  REVASCULARIZATION N/A 05/31/2018   Procedure: Coronary/Graft Acute MI Revascularization;  Surgeon: Yvonne Kendall, MD;  Location: [facility] INV[facility] LAB;  Service: Cardiovascular;  Laterality: N/A;   IABP INSERTION N/A 05/31/2018   Procedure: IABP Insertion;  Surgeon: Yvonne Kendall, MD;  Location: [facility] INVASIVE CV LAB;  Service: Cardiovascular;  Laterality: N/A;   INSERTION OF MESH N/A 09/26/2021   Procedure: INSERTION OF MESH;  Surgeon: Fritzi Mandes, MD;  Location[facility];  Service: General;  Laterality: N/A;   LEFT HEART CATH AND CORONARY ANGIOGRAPHY N/A 05/31/2018    Procedure: LEFT HEART CATH AND CORONARY ANGIOGRAPHY;  Surgeon: Yvonne Kendall, MD;  Location: [facility] INVASIVE CV LAB;  Service: Cardiovascular;  Laterality: N/A;   UMBILICAL HERNIA REPAIR N/A 09/26/2021   Procedure: HERNIA REPAIR UMBILICAL ADULT;  Surgeon: Fritzi Mandes, MD;  Location: [facility] OR;  Service: General;  Laterality: N/A;    Current Medications: Current Meds  Medication Sig   apixaban (ELIQUIS) 5 MG TABS tablet TAKE 1 TABLET BY MOUTH TWICE A DAY   atorvastatin (LIPITOR) 80 MG tablet TAKE 1 TABLET BY MOUTH EVERY DAY AT 6PM   losartan (COZAAR) 25 MG tablet TAKE 1 TABLET (25 MG TOTAL) BY MOUTH DAILY.   Multiple Vitamin (MULTIVITAMIN WITH MINERALS) TABS Take 1 tablet by mouth daily.   nitroGLYCERIN (NITROSTAT) 0.4 MG SL tablet Place 1 tablet (0.4 mg total) under the tongue every 5 (five) minutes as needed.   [DISCONTINUED] carvedilol (COREG) 6.25 MG tablet Take 2 tablets (12.5 mg total) by mouth 2 (two) times daily.     Allergies:   Codeine, Darvon [propoxyphene hcl], and Penicillins   Social History   Socioeconomic History   Marital status: Divorced    Spouse name: Not on file   Number of children: Not on file   Years of education: Not on file   Highest education level: Not on file  Occupational History   Not on file  Tobacco Use   Smoking status: Never   Smokeless tobacco: Never  Vaping Use   Vaping status: Never Used  Substance and Sexual Activity   Alcohol use: Not Currently    Alcohol/week: 1.0 standard drink of alcohol    Types: 1 Glasses of wine per week    Comment: Occasional glass of wine   Drug use: No   Sexual activity: Not on file  Other Topics Concern   Not on file  Social History Narrative   Not on file   Social Determinants of Health   Financial Resource Strain: Low Risk  (01/30/2022)   Overall Financial Resource Strain (CARDIA)    Difficulty of Paying Living Expenses: Not hard at all  Food Insecurity: No Food Insecurity (01/30/2022)   Hunger  Vital Sign    Worried About Running Out of Food in the Last Year: Never true    Ran Out of Food in the Last Year: Never true  Transportation Needs: No Transportation Needs (01/30/2022)   PRAPARE - Administrator, Civil Service (Medical): No    Lack of Transportation (Non-Medical): No  Physical Activity: Sufficiently Active (01/30/2022)   Exercise Vital Sign    Days of Exercise per Week: 5 days    Minutes of Exercise per Session: 60 min  Stress: No Stress Concern Present (01/30/2022)   [employer] of Occupational Health - Occupational Stress Questionnaire    Feeling of Stress : Not at all  Social Connections: Moderately Isolated (01/30/2022)   Social Connection and Isolation Panel [NHANES]  Frequency of Communication with Friends and Family: More than three times a week    Frequency of Social Gatherings with Friends and Family: More than three times a week    Attends Religious Services: Never    Database administrator or Organizations: Yes    Attends Engineer, structural: More than 4 times per year    Marital Status: Divorced     Family History: The patient's family history includes Valvular heart disease in her mother.  ROS:   Please see the history of present illness.     All other systems reviewed and are negative.  EKGs/Labs/Other Studies Reviewed:    The following studies were reviewed today:  Echo 05/2023  1. Left ventricular ejection fraction, by estimation, is 55 to 60%. The  left ventricle has normal function. The left ventricle has no regional  wall motion abnormalities. There is moderate left ventricular hypertrophy.  Left ventricular diastolic  parameters are consistent with Grade I diastolic dysfunction (impaired  relaxation).   2. Right ventricular systolic function is normal. The right ventricular  size is normal.   3. Left atrial size was mildly dilated.   4. The mitral valve is degenerative. Mild mitral valve regurgitation.   Moderate mitral annular calcification.   5. The aortic valve is tricuspid. Aortic valve regurgitation is not  visualized. Aortic valve sclerosis/calcification is present, without any  evidence of aortic stenosis.   6. Aortic dilatation noted. There is mild dilatation of the ascending  aorta, measuring 40 mm.   7. The inferior vena cava is normal in size with greater than 50%  respiratory variability, suggesting right atrial pressure of 3 mmHg.   Echo 08/2018 Study Conclusions   - Left ventricle: The cavity size was normal. Wall thickness was    increased in a pattern of moderate to severe LVH. Systolic    function was normal. The estimated ejection fraction was in the    range of 60% to 65%. Doppler parameters are consistent with    abnormal left ventricular relaxation (grade 1 diastolic    dysfunction). Doppler parameters are consistent with high    ventricular filling pressure.  - Aortic valve: Trileaflet; mildly thickened, mildly calcified    leaflets.  - Ascending aorta: The ascending aorta was mildly dilated.  - Mitral valve: Calcified annulus. There was mild regurgitation.   Cardiac Cath 05/2018 Coronary/Graft Acute MI Revascularization  IABP Insertion  LEFT HEART CATH AND CORONARY ANGIOGRAPHY   Conclusion  Conclusions: Anterior STEMI due to acute plaque rupture and thrombotic thrombotic subtotal occlusion of the mid LAD and D2. Moderate to severe but non-critical disease involving rPL branches. Cardiogenic shock with mildly to moderately elevated LVEDP. Successful PCI to mid LAD/D2 using Synergy 2.75 x 16 mm drug eluting stent in the LAD with 0% residual stenosis and angioplasty of the ostium of D2 with reduction of stenosis from 99% to 70%. Successful placement of 40 mL intra-aortic balloon pump via the right femoral artery. Atrial fibrillation with rapid ventricular response with unsuccessful DCCV x 1 at 200 J.  Rate control improved with amiodarone infusion.    Recommendations: Administer ticagrelor 180 mg when possible and discontinue cangrelor 2 hours after ticagrelor administration. Titrate off norepinephrine as tolerated. Aggressive secondary prevention, including high-intensity statin therapy. Start heparin infusion 2 hours after TR band removal. Continue amiodarone infusion for rate control of atrial fibrillation.  Consider transitioning to beta-blocker once shock has resolved. CXR when patient arrives in ICU to confirm IABP placement. Wean  BiPAP, as tolerated.   Recommend uninterrupted dual antiplatelet therapy with Aspirin 81mg  daily and Ticagrelor 90mg  twice daily for a minimum of 12 months (ACS - Class I recommendation).    Yvonne Kendall, MD [facility] Pager: [phone]  EKG:  EKG is ordered today.  The ekg ordered today demonstrates NSR 79bpm, LAD, anteroseptal q waves, lat q waves  Recent Labs: 03/25/2023: BUN 12; Creatinine, Ser 0.81; Hemoglobin 13.3; Magnesium 2.1; Platelets 216; Potassium 3.6; Sodium 139; TSH 1.984  Recent Lipid Panel    Component Value Date/Time   CHOL 114 05/19/2021 0838   TRIG 128.0 05/19/2021 0838   HDL 29.30 (L) 05/19/2021 0838   CHOLHDL 4 05/19/2021 0838   VLDL 25.6 05/19/2021 0838   LDLCALC 59 05/19/2021 0838     Physical Exam:    VS:  BP 120/80 (BP Location: Left Arm, Patient Position: Sitting, Cuff Size: Normal)   Pulse 79   Ht 5\' 1"  (1.549 m)   Wt 214 lb 9.6 oz (97.3 kg)   SpO2 96%   BMI 40.55 kg/m     Wt Readings from Last 3 Encounters:  09/21/23 214 lb 9.6 oz (97.3 kg)  03/29/23 216 lb (98 kg)  03/25/23 210 lb (95.3 kg)     GEN:  Well nourished, well developed in no acute distress HEENT: Normal NECK: No JVD; No carotid bruits LYMPHATICS: No lymphadenopathy CARDIAC: RRR, no murmurs, rubs, gallops RESPIRATORY:  Clear to auscultation without rales, wheezing or rhonchi  ABDOMEN: Soft, non-tender, non-distended MUSCULOSKELETAL:  No edema; No deformity  SKIN: Warm and  dry NEUROLOGIC:  Alert and oriented x 3 PSYCHIATRIC:  Normal affect   ASSESSMENT:    1. Paroxysmal atrial fibrillation (HCC)   2. Coronary artery disease involving native artery of transplanted heart without angina pectoris   3. Hyperlipidemia, mixed   4. Essential hypertension    PLAN:    In order of problems listed above:  pAfib EKG today shows NSR. She is overall feeling well, denies palpitations or heart fluttering. Continue Eliquis 5mg  BID. She denies bleeding issues. Continue Coreg 12.5mg  BID for rate control.   CAD with STEMI in 2019 Patient denies anginal symptoms. No Brilinta given Eliquis. Continue BB and statin therapy. No further ischemic work-up at this time.   HLD PCP will update labs. Continue Lipitor 80mg  daily.   HTN BP is good today. Continue Coreg 6.12.5mg  BID and Losartan 25mg  BID, I will BB.   Disposition: Follow up in 6 month(s) with MD   Signed, Jaevon Paras David Stall, PA-C  09/21/2023 11:11 AM    [facility]

## 2023-09-21 ENCOUNTER — Ambulatory Visit: Payer: PPO | Attending: Medical | Admitting: Medical

## 2023-09-21 ENCOUNTER — Encounter: Payer: Self-pay | Admitting: Medical

## 2023-09-21 VITALS — BP 120/80 | HR 79 | Ht 61.0 in | Wt 214.6 lb

## 2023-09-21 DIAGNOSIS — I10 Essential (primary) hypertension: Secondary | ICD-10-CM

## 2023-09-21 DIAGNOSIS — E782 Mixed hyperlipidemia: Secondary | ICD-10-CM

## 2023-09-21 DIAGNOSIS — I480 Paroxysmal atrial fibrillation: Secondary | ICD-10-CM | POA: Diagnosis not present

## 2023-09-21 DIAGNOSIS — I25811 Atherosclerosis of native coronary artery of transplanted heart without angina pectoris: Secondary | ICD-10-CM

## 2023-09-21 MED ORDER — CARVEDILOL 12.5 MG PO TABS: 12.5000 mg | ORAL_TABLET | Freq: Two times a day (BID) | ORAL | 3 refills | Status: DC

## 2023-09-21 NOTE — Patient Instructions (Signed)
Medication Instructions:  Your physician recommends that you continue on your current medications as directed. Please refer to the Current Medication list given to you today.   *If you need a refill on your cardiac medications before your next appointment, please call your pharmacy*   Lab Work: No labs ordered today    Testing/Procedures: No test ordered today    Follow-Up: At [facility], you and your health needs are our priority.  As part of our continuing mission to provide you with exceptional heart care, we have created designated Provider Care Teams.  These Care Teams include your primary Cardiologist (physician) and Advanced Practice Providers (APPs -  Physician Assistants and Nurse Practitioners) who all work together to provide you with the care you need, when you need it.  We recommend signing up for the patient portal called "MyChart".  Sign up information is provided on this After Visit Summary.  MyChart is used to connect with patients for Virtual Visits (Telemedicine).  Patients are able to view lab/test results, encounter notes, upcoming appointments, etc.  Non-urgent messages can be sent to your provider as well.   To learn more about what you can do with MyChart, go to [email or web address].    Your next appointment:   6 month(s)  Provider:   Yvonne Kendall, MD

## 2023-10-05 ENCOUNTER — Ambulatory Visit: Payer: PPO

## 2023-10-15 ENCOUNTER — Encounter: Payer: PPO | Admitting: General Practice

## 2023-11-26 ENCOUNTER — Ambulatory Visit: Payer: PPO

## 2023-12-21 ENCOUNTER — Other Ambulatory Visit: Payer: Self-pay | Admitting: Internal Medicine

## 2023-12-21 DIAGNOSIS — I480 Paroxysmal atrial fibrillation: Secondary | ICD-10-CM

## 2023-12-21 NOTE — Telephone Encounter (Signed)
 Prescription refill request for Eliquis  received. Indication: Afib  Last office visit:09/21/23 Gennaro Khat)  Scr: 0.81 (03/25/23) Age: 73 Weight: 97.3kg  Appropriate dose. Refill sent.

## 2024-01-31 ENCOUNTER — Telehealth: Payer: Self-pay | Admitting: Internal Medicine

## 2024-01-31 NOTE — Telephone Encounter (Signed)
 STAT if HR is under 50 or over 120 (normal HR is 60-100 beats per minute)  What is your heart rate? 65  Do you have a log of your heart rate readings (document readings)? 54, 56, 57, 60, 61, 63  Do you have any other symptoms? no

## 2024-01-31 NOTE — Telephone Encounter (Signed)
 Patient called to report that, for the last several days, she has noticed a decrease in her heart rate at rest and while sleeping. She stated that her heart rate typically runs in the 60s or higher but has observed it dropping into the 50s. The patient mentioned she is asymptomatic but wanted to inquire whether this is something she should be concerned about.  The nurse informed the patient that it is not uncommon for the heart rate to decrease during sleep. However, the nurse will forward a message to Dr. Okey Dupre for further recommendations.   Today 60 55  57  While sleeping 54  56  57 60 61

## 2024-01-31 NOTE — Telephone Encounter (Signed)
 As long as Lindsey Pierce is feeling well and her BP remains normal, I do not think any urgent intervention is needed.  It is not unusually for heart rates to drop into the 50's while resting/asleep.  She is on carvedilol, which can lower her heart rate as well.  I recommend that she follow-up with me or an APP in early May, sooner if she develops and symptoms.  Yvonne Kendall, MD [facility]

## 2024-01-31 NOTE — Telephone Encounter (Signed)
 Pt made aware and verbalized understanding.

## 2024-03-21 ENCOUNTER — Ambulatory Visit: Attending: Internal Medicine | Admitting: Internal Medicine

## 2024-03-21 VITALS — BP 128/76 | HR 67 | Ht 61.0 in | Wt 211.4 lb

## 2024-03-21 DIAGNOSIS — I2510 Atherosclerotic heart disease of native coronary artery without angina pectoris: Secondary | ICD-10-CM

## 2024-03-21 DIAGNOSIS — I5032 Chronic diastolic (congestive) heart failure: Secondary | ICD-10-CM | POA: Diagnosis not present

## 2024-03-21 DIAGNOSIS — I480 Paroxysmal atrial fibrillation: Secondary | ICD-10-CM | POA: Diagnosis not present

## 2024-03-21 DIAGNOSIS — E785 Hyperlipidemia, unspecified: Secondary | ICD-10-CM | POA: Diagnosis not present

## 2024-03-21 NOTE — Progress Notes (Unsigned)
  Cardiology Office Note:  .   Date:  03/21/2024  ID:  Lindsey Pierce, DOB [birth year], MRN [id number] PCP: Steven Elam, FNP  [facility] Providers Cardiologist:  Sammy Crisp, MD { Click to update primary MD,subspecialty MD or APP then REFRESH:1}    History of Present Illness: .   Lindsey Pierce is a 73 y.o. female with history of coronary artery disease with anterior STEMI complicated by cardiogenic shock (LVEF 40-45% at time of MI; 60-65% on repeat echo in 08/2018), paroxysmal atrial fibrillation, and sustained ventricular tachycardia in the immediate post MI period, as well as hypertension and hyperlipidemia, who presents for follow-up of coronary artery disease, atrial fibrillation, and heart failure.  She was last seen in our office in 09/2019 for by Cadence Furth, PA, at which time she was feeling well.  No medication changes or additional testing were pursued at that time.  Tachycardia a year ago.  Being very careful about caffeine.  Past weekend had some and noticed HR went a bit crazy.  Went up to 113 at rest.  Felt like it was a beating.  Tries to go ~7500 stepts per day.  Walks laps around the house b/c doesn't do well on uneven surfaces.  No SOB or CP.  Little dizzy if hot but not otherwise.  Some mild orthostatic LH.  Home BP between 112-120/78-82.  Only 2-3 episodes of palpitations in the last year.  Going Monday to new PCP on Monday.  Will get labs.  ROS: See HPI  Studies Reviewed: .        *** Risk Assessment/Calculations:   {Does this patient have ATRIAL FIBRILLATION?:[phone]}         Physical Exam:   VS:  BP 128/76 (BP Location: Left Arm, Patient Position: Sitting)   Pulse 67   Ht 5\' 1"  (1.549 m)   Wt 211 lb 6.4 oz (95.9 kg)   SpO2 96%   BMI 39.94 kg/m    Wt Readings from Last 3 Encounters:  03/21/24 211 lb 6.4 oz (95.9 kg)  09/21/23 214 lb 9.6 oz (97.3 kg)  03/29/23 216 lb (98 kg)    General:  NAD. Neck: No JVD or HJR. Lungs: Clear  to auscultation bilaterally without wheezes or crackles. Heart: Regular rate and rhythm without murmurs, rubs, or gallops. Abdomen: Soft, nontender, nondistended. Extremities: No lower extremity edema.  ASSESSMENT AND PLAN: .    ***    {Are you ordering a CV Procedure (e.g. stress test, cath, DCCV, TEE, etc)?   Press F2        :[id number]}  Dispo: ***  Signed, Sammy Crisp, MD

## 2024-03-21 NOTE — Patient Instructions (Signed)
 Medication Instructions:  Your Physician recommend you continue on your current medication as directed.    *If you need a refill on your cardiac medications before your next appointment, please call your pharmacy*  Lab Work: No labs ordered today  If you have labs (blood work) drawn today and your tests are completely normal, you will receive your results only by: MyChart Message (if you have MyChart) OR A paper copy in the mail If you have any lab test that is abnormal or we need to change your treatment, we will call you to review the results.  Testing/Procedures: No test ordered today   Follow-Up: At [facility], you and your health needs are our priority.  As part of our continuing mission to provide you with exceptional heart care, our providers are all part of one team.  This team includes your primary Cardiologist (physician) and Advanced Practice Providers or APPs (Physician Assistants and Nurse Practitioners) who all work together to provide you with the care you need, when you need it.  Your next appointment:   6 month(s)  Provider:   You may see Sammy Crisp, MD or one of the following Advanced Practice Providers on your designated Care Team:   Laneta Pintos, NP Gildardo Labrador, PA-C Varney Gentleman, PA-C Cadence [place], PA-C Ronald Cockayne, NP Morey Ar, NP

## 2024-03-23 ENCOUNTER — Encounter: Payer: Self-pay | Admitting: Internal Medicine

## 2024-03-23 DIAGNOSIS — I5032 Chronic diastolic (congestive) heart failure: Secondary | ICD-10-CM | POA: Insufficient documentation

## 2024-03-24 ENCOUNTER — Ambulatory Visit (INDEPENDENT_AMBULATORY_CARE_PROVIDER_SITE_OTHER): Admitting: General Practice

## 2024-03-24 ENCOUNTER — Encounter: Payer: Self-pay | Admitting: General Practice

## 2024-03-24 VITALS — BP 132/82 | HR 84 | Temp 98.3°F | Ht 61.0 in | Wt 212.0 lb

## 2024-03-24 DIAGNOSIS — Z1231 Encounter for screening mammogram for malignant neoplasm of breast: Secondary | ICD-10-CM

## 2024-03-24 DIAGNOSIS — R051 Acute cough: Secondary | ICD-10-CM

## 2024-03-24 DIAGNOSIS — Z6841 Body Mass Index (BMI) 40.0 and over, adult: Secondary | ICD-10-CM

## 2024-03-24 DIAGNOSIS — I10 Essential (primary) hypertension: Secondary | ICD-10-CM | POA: Diagnosis not present

## 2024-03-24 DIAGNOSIS — E66813 Obesity, class 3: Secondary | ICD-10-CM

## 2024-03-24 DIAGNOSIS — Z7689 Persons encountering health services in other specified circumstances: Secondary | ICD-10-CM

## 2024-03-24 DIAGNOSIS — E785 Hyperlipidemia, unspecified: Secondary | ICD-10-CM

## 2024-03-24 DIAGNOSIS — E119 Type 2 diabetes mellitus without complications: Secondary | ICD-10-CM

## 2024-03-24 DIAGNOSIS — Z780 Asymptomatic menopausal state: Secondary | ICD-10-CM | POA: Diagnosis not present

## 2024-03-24 DIAGNOSIS — I480 Paroxysmal atrial fibrillation: Secondary | ICD-10-CM

## 2024-03-24 DIAGNOSIS — Z1211 Encounter for screening for malignant neoplasm of colon: Secondary | ICD-10-CM | POA: Insufficient documentation

## 2024-03-24 LAB — POC COVID19 BINAXNOW: SARS Coronavirus 2 Ag: NEGATIVE

## 2024-03-24 MED ORDER — BENZONATATE 200 MG PO CAPS
200.0000 mg | ORAL_CAPSULE | Freq: Three times a day (TID) | ORAL | 0 refills | Status: DC | PRN
Start: 2024-03-24 — End: 2024-04-14

## 2024-03-24 NOTE — Assessment & Plan Note (Signed)
 Controlled.   Reviewed cardiology notes from 03/21/24.  Continue Losartan  25mg  once daily and carvedilol  12.5 mg BID.   Will check labs at next visit.

## 2024-03-24 NOTE — Assessment & Plan Note (Signed)
 Controlled.  Reviewed cardiology notes from 03/21/24.  Continue Eliquis  5 mg BID.

## 2024-03-24 NOTE — Assessment & Plan Note (Addendum)
 POC Covid 19 negative. Lungs clear. Exam stable. Symptoms suggestive of seasonal allergies.   Recommend daily antihistamine once daily, increasing water intake and using a cool-mist humidifier.  Start mucinex plain as needed for cough.  Start Benzonatate capsules for cough. Take 1 capsule by mouth three times daily as needed for cough.  Consider chest x-ray if not better.

## 2024-03-24 NOTE — Assessment & Plan Note (Signed)
 Discussed the importance of healthy diet and exercise to affect sustainable weight loss.

## 2024-03-24 NOTE — Assessment & Plan Note (Signed)
 Lipid panel at next visit as she is not fasting.  Reviewed cardiology notes from 03/21/24.   Continue Atorvastatin  80 mg once daily.

## 2024-03-24 NOTE — Progress Notes (Signed)
 New Patient Office Visit  Subjective    Patient ID: Lindsey Pierce, female    DOB: 03-19-1951  Age: 73 y.o. MRN: [id number]  CC:  Chief Complaint  Patient presents with   New Patient (Initial Visit)   Cough    And runny nose since Saturday. Patient has not done any at home testing. Patient has not been taking anything for her sx.     HPI Lindsey Pierce is a 73 y.o. female presents to establish care.  Previous PCP/physical/labs: Colin Dawley, FNP  Cough/Runny nose: symptom onset was Saturday. Post nasal drip, productive cough with clear phlegm, cough is worse during the day.  No fever, no sinus pain or pressure, shortness of breath or chest pain.  She has not done any home testing. She has not tried any medications.  No hx of asthma or smoking. No recent influenza or covid vaccine.   Paroxysmal A Fib: diagnosed last year. followed by cardiology. Currently managed on Eliquis  5 mg BID. No concerns today.   HTN: followed by cardiology. Currently managed on Losartan  25 mg once daily and carvedilol  12.5 mg twice daily.   HLD: followed by cardiology. Currently managed on atorvastatin  80 mg once daily.   Type 2 DM: last A1c- 5.9 in 2022. She was on insulin  and metformin  for three months in 2019 after her heart attack. Then it was discontinued. Since then it has been on diet and exercise controlled. She denies polyphagia, polydipsia and polyuria.    Outpatient Encounter Medications as of 03/24/2024  Medication Sig   atorvastatin  (LIPITOR ) 80 MG tablet TAKE 1 TABLET BY MOUTH EVERY DAY AT 6PM   benzonatate (TESSALON) 200 MG capsule Take 1 capsule (200 mg total) by mouth 3 (three) times daily as needed.   carvedilol  (COREG ) 12.5 MG tablet Take 1 tablet (12.5 mg total) by mouth 2 (two) times daily.   ELIQUIS  5 MG TABS tablet TAKE 1 TABLET BY MOUTH TWICE A DAY   losartan  (COZAAR ) 25 MG tablet TAKE 1 TABLET (25 MG TOTAL) BY MOUTH DAILY.   Multiple Vitamin (MULTIVITAMIN WITH  MINERALS) TABS Take 1 tablet by mouth daily.   [DISCONTINUED] nitroGLYCERIN  (NITROSTAT ) 0.4 MG SL tablet Place 1 tablet (0.4 mg total) under the tongue every 5 (five) minutes as needed. (Patient not taking: Reported on 03/24/2024)   No facility-administered encounter medications on file as of 03/24/2024.    Past Medical History:  Diagnosis Date   CAD (coronary artery disease)    a. 05/2018 Ant STEMI/PCI: LM nl, LAD 53m(2.75 x 16 Synergy DES), D2 99 (PTCA), RI 40ost, LCX mild diff dzs, OM1/2/3 nl, RCA large, diffuse dzs, RPDA nl, RPAV 60, RPLB1 80 (small).   Diabetes (HCC)    HFrEF (heart failure with reduced ejection fraction) (HCC)    a. 05/2018 Echo: EF 40-45%, mod LVH, Gr2 DD, mid antsept, apical septal, apical AK. Mid-apical ant, mid infsept HK. Nl RV fxn. Triv TR. b. Echo 08/2018: mod-sev. LVH, EF 60-65%, Grade 1 DD, AV-mild calcification, mild MR, mild dialation As. Aorta.   Hyperlipidemia    Hypertension    Ischemic cardiomyopathy    a. 05/2018 Echo: EF 40-45%. b. 08/2018 Echo: EF 60-65%   PAF (paroxysmal atrial fibrillation) (HCC)    a. 05/2018 at time of STEMI, converted on Amio. No OAC.   STEMI involving left anterior descending coronary artery (HCC)    a. 05/2018 - complicated by CGS req IABP and VT req amiodarone .   V-tach (HCC)  a. 05/2018 at time of Ant STEMI-->Amio.    Past Surgical History:  Procedure Laterality Date   APPENDECTOMY     CARDIAC CATHETERIZATION     CHOLECYSTECTOMY OPEN     CORONARY/GRAFT ACUTE MI REVASCULARIZATION N/A 05/31/2018   Procedure: Coronary/Graft Acute MI Revascularization;  Surgeon: Sammy Crisp, MD;  Location: [facility];  Service: Cardiovascular;  Laterality: N/A;   IABP INSERTION N/A 05/31/2018   Procedure: IABP Insertion;  Surgeon: Sammy Crisp, MD;  Location: [facility];  Service: Cardiovascular;  Laterality: N/A;   INSERTION OF MESH N/A 09/26/2021   Procedure: INSERTION OF MESH;  Surgeon: Lujean Sake, MD;   Location: [facility] OR;  Service: General;  Laterality: N/A;   LEFT HEART CATH AND CORONARY ANGIOGRAPHY N/A 05/31/2018   Procedure: LEFT HEART CATH AND CORONARY ANGIOGRAPHY;  Surgeon: Sammy Crisp, MD;  Location: [facility];  Service: Cardiovascular;  Laterality: N/A;   UMBILICAL HERNIA REPAIR N/A 09/26/2021   Procedure: HERNIA REPAIR UMBILICAL ADULT;  Surgeon: Lujean Sake, MD;  Location[facility];  Service: General;  Laterality: N/A;    Family History  Problem Relation Age of Onset   Valvular heart disease Mother     Social History   Socioeconomic History   Marital status: Divorced    Spouse name: Not on file   Number of children: Not on file   Years of education: Not on file   Highest education level: Some college, no degree  Occupational History   Not on file  Tobacco Use   Smoking status: Never   Smokeless tobacco: Never  Vaping Use   Vaping status: Never Used  Substance and Sexual Activity   Alcohol use: Not Currently    Alcohol/week: 1.0 standard drink of alcohol    Types: 1 Glasses of wine per week    Comment: Occasional glass of wine   Drug use: No   Sexual activity: Not on file  Other Topics Concern   Not on file  Social History Narrative   Not on file   Social Drivers of Health   Financial Resource Strain: Low Risk  (03/20/2024)   Overall Financial Resource Strain (CARDIA)    Difficulty of Paying Living Expenses: Not very hard  Food Insecurity: No Food Insecurity (03/20/2024)   Hunger Vital Sign    Worried About Running Out of Food in the Last Year: Never true    Ran Out of Food in the Last Year: Never true  Transportation Needs: No Transportation Needs (03/20/2024)   PRAPARE - Administrator, Civil Service (Medical): No    Lack of Transportation (Non-Medical): No  Physical Activity: Insufficiently Active (03/20/2024)   Exercise Vital Sign    Days of Exercise per Week: 2 days    Minutes of Exercise per Session: 20 min  Stress: No Stress  Concern Present (03/20/2024)   [employer] of Occupational Health - Occupational Stress Questionnaire    Feeling of Stress : Only a little  Social Connections: Moderately Integrated (03/20/2024)   Social Connection and Isolation Panel [NHANES]    Frequency of Communication with Friends and Family: More than three times a week    Frequency of Social Gatherings with Friends and Family: More than three times a week    Attends Religious Services: More than 4 times per year    Active Member of [employer] or Organizations: Yes    Attends Banker Meetings: More than 4 times per year  Marital Status: Divorced  Catering manager Violence: Not At Risk (01/30/2022)   Humiliation, Afraid, Rape, and Kick questionnaire    Fear of Current or Ex-Partner: No    Emotionally Abused: No    Physically Abused: No    Sexually Abused: No    Review of Systems  Constitutional:  Negative for chills and fever.  HENT:  Negative for congestion, ear pain, sinus pain and sore throat.   Respiratory:  Positive for cough. Negative for shortness of breath.   Cardiovascular:  Negative for chest pain.  Gastrointestinal:  Negative for abdominal pain, constipation, diarrhea, heartburn, nausea and vomiting.  Genitourinary:  Negative for dysuria, frequency and urgency.  Neurological:  Negative for dizziness and headaches.  Endo/Heme/Allergies:  Negative for polydipsia.  Psychiatric/Behavioral:  Negative for depression and suicidal ideas. The patient is not nervous/anxious.         Objective    BP 132/82 (BP Location: Left Arm, Patient Position: Sitting, Cuff Size: Large)   Pulse 84   Temp 98.3 F (36.8 C) (Oral)   Ht 5\' 1"  (1.549 m)   Wt 212 lb (96.2 kg)   SpO2 96%   BMI 40.06 kg/m   Physical Exam Vitals and nursing note reviewed.  Constitutional:      Appearance: Normal appearance.  HENT:     Right Ear: Tympanic membrane, ear canal and external ear normal.     Left Ear: Tympanic membrane, ear  canal and external ear normal.     Nose: Rhinorrhea present.     Mouth/Throat:     Mouth: Mucous membranes are moist.  Eyes:     Conjunctiva/sclera: Conjunctivae normal.  Cardiovascular:     Rate and Rhythm: Normal rate and regular rhythm.     Pulses: Normal pulses.     Heart sounds: Normal heart sounds.  Pulmonary:     Effort: Pulmonary effort is normal.     Breath sounds: Normal breath sounds.  Neurological:     Mental Status: She is alert and oriented to person, place, and time.  Psychiatric:        Mood and Affect: Mood normal.        Behavior: Behavior normal.        Thought Content: Thought content normal.        Judgment: Judgment normal.         Assessment & Plan:  Acute cough Assessment & Plan: POC Covid 19 negative. Lungs clear. Exam stable. Symptoms suggestive of seasonal allergies.   Recommend daily antihistamine once daily, increasing water intake and using a cool-mist humidifier.  Start mucinex plain as needed for cough.  Start Benzonatate capsules for cough. Take 1 capsule by mouth three times daily as needed for cough.  Consider chest x-ray if not better.  Orders: -     POC COVID-19 BinaxNow -     Benzonatate; Take 1 capsule (200 mg total) by mouth 3 (three) times daily as needed.  Dispense: 20 capsule; Refill: 0  Establishing care with new doctor, encounter for Assessment & Plan: EMR reviewed briefly.    Encounter for screening mammogram for malignant neoplasm of breast -     3D Screening Mammogram, Left and Right; Future  Postmenopausal -     DG Bone Density; Future  Screening for colon cancer -     Cologuard  Morbid obesity (HCC)  Type 2 diabetes mellitus without complication, without long-term current use of insulin  (HCC) Assessment & Plan: No recent A1c. Discussed at length.   Labs  at next visit.  Foot exam at next visit.  Managed on statin and ARB.  She will schedule eye exam.   Hyperlipidemia LDL goal <70 Assessment &  Plan: Lipid panel at next visit as she is not fasting.  Reviewed cardiology notes from 03/21/24.   Continue Atorvastatin  80 mg once daily.    Essential hypertension Assessment & Plan: Controlled.   Reviewed cardiology notes from 03/21/24.  Continue Losartan  25mg  once daily and carvedilol  12.5 mg BID.   Will check labs at next visit.   Class 3 severe obesity due to excess calories with serious comorbidity and body mass index (BMI) of 40.0 to 44.9 in adult Assessment & Plan: Discussed the importance of healthy diet and exercise to affect sustainable weight loss.    Paroxysmal atrial fibrillation (HCC) Assessment & Plan: Controlled.  Reviewed cardiology notes from 03/21/24.  Continue Eliquis  5 mg BID.       Return in about 2 weeks (around 04/07/2024) for physical and fasting labs.   Jolanda Nation, NP

## 2024-03-24 NOTE — Assessment & Plan Note (Signed)
 EMR reviewed briefly.

## 2024-03-24 NOTE — Assessment & Plan Note (Signed)
 No recent A1c. Discussed at length.   Labs at next visit.  Foot exam at next visit.  Managed on statin and ARB.  She will schedule eye exam.

## 2024-03-24 NOTE — Patient Instructions (Addendum)
 Schedule shingles vaccine at the pharmacy.  Schedule diabetic eye exam.   You can try a few things over the counter to help with your symptoms including:  Cough: Delsym or Robitussin (get the off brand, works just as well) Chest Congestion: Mucinex (plain) Nasal Congestion/Ear Pressure/Sinus Pressure: Try using Flonase (fluticasone) nasal spray. Instill 1 spray in each nostril twice daily. This can be purchased over the counter. Body aches, fevers, headache: Ibuprofen (not to exceed 2400 mg in 24 hours) or Acetaminophen -Tylenol  (not to exceed 3000 mg in 24 hours) Runny Nose/Throat Drainage/Sneezing/Itchy or Watery Eyes: An antihistamine such as Zyrtec, Claritin, Xyzal, Allegra  You should be feeling better by day seven of symptoms, but please do contact me if this is not the case.  Increase water intake, rest and use cool-mist humidifier at night.   You have an order for:  []   2D Mammogram  [x]   3D Mammogram  [x]   Bone Density     Please call for appointment:  [facility] [address] [place], [place] [address] [phone] [facility] [address] [place], [place] [address] [phone] [facility] [address] [place], [place] [address] [phone] [facility] Bone Density [address] [place], [place] [address] [phone] [facility] [address] [place], [place] [address] [phone]   Make sure to wear two-piece clothing.  No lotions, powders, or deodorants the day of the appointment. Make sure to bring picture ID and insurance card.  Bring list of medications you are currently taking including any supplements.   Schedule your Joanna screening mammogram through MyChart!   Log into your MyChart account.  Go to 'Visit' (or 'Appointments' if on mobile App) --> Schedule an Appointment  Under 'Select a Reason for Visit' choose the Mammogram  Screening option.  Complete the pre-visit questions and select the time and place that best fits your schedule.   Follow up in 2 weeks for physical/labs.   It was a pleasure meeting you.

## 2024-03-30 ENCOUNTER — Other Ambulatory Visit: Payer: Self-pay | Admitting: General Practice

## 2024-03-30 DIAGNOSIS — E785 Hyperlipidemia, unspecified: Secondary | ICD-10-CM

## 2024-03-30 DIAGNOSIS — Z6841 Body Mass Index (BMI) 40.0 and over, adult: Secondary | ICD-10-CM

## 2024-03-30 DIAGNOSIS — I480 Paroxysmal atrial fibrillation: Secondary | ICD-10-CM

## 2024-03-30 DIAGNOSIS — I10 Essential (primary) hypertension: Secondary | ICD-10-CM

## 2024-03-30 DIAGNOSIS — E1165 Type 2 diabetes mellitus with hyperglycemia: Secondary | ICD-10-CM

## 2024-03-30 DIAGNOSIS — Z0000 Encounter for general adult medical examination without abnormal findings: Secondary | ICD-10-CM

## 2024-04-08 ENCOUNTER — Encounter: Admitting: General Practice

## 2024-04-08 ENCOUNTER — Encounter: Payer: Self-pay | Admitting: General Practice

## 2024-04-11 ENCOUNTER — Other Ambulatory Visit

## 2024-04-11 DIAGNOSIS — E785 Hyperlipidemia, unspecified: Secondary | ICD-10-CM

## 2024-04-11 DIAGNOSIS — E66813 Obesity, class 3: Secondary | ICD-10-CM

## 2024-04-11 DIAGNOSIS — E1165 Type 2 diabetes mellitus with hyperglycemia: Secondary | ICD-10-CM

## 2024-04-11 DIAGNOSIS — I480 Paroxysmal atrial fibrillation: Secondary | ICD-10-CM | POA: Diagnosis not present

## 2024-04-11 DIAGNOSIS — Z0000 Encounter for general adult medical examination without abnormal findings: Secondary | ICD-10-CM | POA: Diagnosis not present

## 2024-04-11 DIAGNOSIS — Z6841 Body Mass Index (BMI) 40.0 and over, adult: Secondary | ICD-10-CM | POA: Diagnosis not present

## 2024-04-11 DIAGNOSIS — I10 Essential (primary) hypertension: Secondary | ICD-10-CM | POA: Diagnosis not present

## 2024-04-11 LAB — LIPID PANEL
Cholesterol: 116 mg/dL (ref 0–200)
HDL: 32.7 mg/dL — ABNORMAL LOW (ref >39.00)
LDL Cholesterol: 63 mg/dL (ref 0–99)
NonHDL: 83.08
Total CHOL/HDL Ratio: 4
Triglycerides: 98 mg/dL (ref 0.0–149.0)
VLDL: 19.6 mg/dL (ref 0.0–40.0)

## 2024-04-11 LAB — CBC
HCT: 38.9 % (ref 36.0–46.0)
Hemoglobin: 13.1 g/dL (ref 12.0–15.0)
MCHC: 33.6 g/dL (ref 30.0–36.0)
MCV: 83.9 fl (ref 78.0–100.0)
Platelets: 201 10*3/uL (ref 150.0–400.0)
RBC: 4.64 Mil/uL (ref 3.87–5.11)
RDW: 15.4 % (ref 11.5–15.5)
WBC: 5.4 10*3/uL (ref 4.0–10.5)

## 2024-04-11 LAB — COMPREHENSIVE METABOLIC PANEL WITH GFR
ALT: 13 U/L (ref 0–35)
AST: 15 U/L (ref 0–37)
Albumin: 4.3 g/dL (ref 3.5–5.2)
Alkaline Phosphatase: 88 U/L (ref 39–117)
BUN: 16 mg/dL (ref 6–23)
CO2: 28 meq/L (ref 19–32)
Calcium: 8.9 mg/dL (ref 8.4–10.5)
Chloride: 103 meq/L (ref 96–112)
Creatinine, Ser: 0.73 mg/dL (ref 0.40–1.20)
GFR: 81.65 mL/min (ref >60.00)
Glucose, Bld: 163 mg/dL — ABNORMAL HIGH (ref 70–99)
Potassium: 4.1 meq/L (ref 3.5–5.1)
Sodium: 139 meq/L (ref 135–145)
Total Bilirubin: 0.7 mg/dL (ref 0.2–1.2)
Total Protein: 7 g/dL (ref 6.0–8.3)

## 2024-04-11 LAB — TSH: TSH: 2.28 u[IU]/mL (ref 0.35–5.50)

## 2024-04-11 LAB — HEMOGLOBIN A1C: Hgb A1c MFr Bld: 6.7 % — ABNORMAL HIGH (ref 4.6–6.5)

## 2024-04-11 NOTE — Addendum Note (Signed)
 Addended by: Bernadene Brewer on: 04/11/2024 08:48 AM   Modules accepted: Orders

## 2024-04-14 ENCOUNTER — Ambulatory Visit: Payer: Self-pay | Admitting: General Practice

## 2024-04-14 ENCOUNTER — Ambulatory Visit
Admission: RE | Admit: 2024-04-14 | Discharge: 2024-04-14 | Disposition: A | Discharge status: Home / Self Care | Source: Ambulatory Visit | Attending: General Practice | Admitting: General Practice

## 2024-04-14 ENCOUNTER — Ambulatory Visit (INDEPENDENT_AMBULATORY_CARE_PROVIDER_SITE_OTHER): Admitting: General Practice

## 2024-04-14 ENCOUNTER — Encounter: Payer: Self-pay | Admitting: General Practice

## 2024-04-14 VITALS — BP 130/68 | HR 71 | Temp 98.7°F | Ht 61.0 in | Wt 210.0 lb

## 2024-04-14 DIAGNOSIS — Z7984 Long term (current) use of oral hypoglycemic drugs: Secondary | ICD-10-CM

## 2024-04-14 DIAGNOSIS — Z0000 Encounter for general adult medical examination without abnormal findings: Secondary | ICD-10-CM

## 2024-04-14 DIAGNOSIS — Z0001 Encounter for general adult medical examination with abnormal findings: Secondary | ICD-10-CM

## 2024-04-14 DIAGNOSIS — Z1231 Encounter for screening mammogram for malignant neoplasm of breast: Secondary | ICD-10-CM

## 2024-04-14 DIAGNOSIS — E1165 Type 2 diabetes mellitus with hyperglycemia: Secondary | ICD-10-CM | POA: Diagnosis not present

## 2024-04-14 MED ORDER — BLOOD GLUCOSE TEST VI STRP
1.0000 | ORAL_STRIP | Freq: Three times a day (TID) | 0 refills | Status: DC
Start: 2024-04-14 — End: 2024-05-15

## 2024-04-14 MED ORDER — BLOOD GLUCOSE MONITORING SUPPL DEVI: 1.0000 | Freq: Three times a day (TID) | 0 refills | Status: AC

## 2024-04-14 MED ORDER — LANCETS MISC. MISC
1.0000 | Freq: Three times a day (TID) | 0 refills | Status: AC
Start: 2024-04-14 — End: 2024-05-14

## 2024-04-14 MED ORDER — LANCET DEVICE MISC: 1.0000 | Freq: Three times a day (TID) | 0 refills | Status: AC

## 2024-04-14 MED ORDER — METFORMIN HCL ER 500 MG PO TB24: 500.0000 mg | ORAL_TABLET | Freq: Every day | ORAL | 0 refills | Status: DC

## 2024-04-14 NOTE — Assessment & Plan Note (Signed)
>>  ASSESSMENT AND PLAN FOR PREVENTATIVE HEALTH CARE WRITTEN ON 04/14/2024  2:27 PM BY Jolanda Nation, NP  Immunizations due to tdap and shingrix. Pneumonia vaccine UTD. Mammogram UTD Colonoscopy due - cologuard she will complete.    Discussed the importance of a healthy diet and regular exercise in order for weight loss, and to reduce the risk of further co-morbidity.  Exam stable. Reviewed labs with patient.  Follow up in 1 year for repeat physical.

## 2024-04-14 NOTE — Patient Instructions (Addendum)
 Bring your urine for the diabetes testing.   Start checking your blood sugar levels.  Appropriate times to check your blood sugar levels are:  -Before any meal (breakfast, lunch, dinner) -Two hours after any meal (breakfast, lunch, dinner) -Bedtime  Record your readings and notify me if you continue to consistently run at or above 150.   Start metformin  xr 500 mg once daily with breakfast.   It is important that you improve your diet. Please limit carbohydrates in the form of white bread, rice, pasta, sweets, fast food, fried food, sugary drinks, etc. Increase your consumption of fresh fruits and vegetables, whole grains, lean protein.  Ensure you are consuming 64 ounces of water daily.   Follow up in 3 months.   It was a pleasure to see you today!

## 2024-04-14 NOTE — Assessment & Plan Note (Signed)
 Immunizations due to tdap and shingrix. Pneumonia vaccine UTD. Mammogram UTD Colonoscopy due - cologuard she will complete.    Discussed the importance of a healthy diet and regular exercise in order for weight loss, and to reduce the risk of further co-morbidity.  Exam stable. Reviewed labs with patient.  Follow up in 1 year for repeat physical.

## 2024-04-14 NOTE — Assessment & Plan Note (Addendum)
 Hemoglobin A1c- 6.7.  Worsening.   Discussed treatment options at length. She would like to start metformin .   Start metformin  xr 500 mg once daily. Rx sent. Rx sent for glucometer. Handout provided for diabetes diet.  Foot exam completed today.  Urine acr pending- she will bring it Friday.  Scheduled for diabetic eye exam.  Pneumonia vaccine UTD.  Follow up in 3 months.

## 2024-04-14 NOTE — Progress Notes (Signed)
 Established Patient Office Visit  Subjective   Patient ID: Lindsey Pierce, female    DOB: Nov 14, 1950  Age: 73 y.o. MRN: [id number]  Chief Complaint  Patient presents with   Annual Exam    HPI  Gelisa Tieken is a 73 year old female with past medical history of HTN, A fib, DM2, HLD, cardiomyopathy CAD presents today for complete physical and follow up of chronic conditions.  Immunizations: -Tetanus: 8 years ago.  -Shingles: due -Pneumonia: Completed   Diet: Fair diet.  Exercise: regular exercise, 2 - 15 minute walks daily.   Eye exam: Completes annually; scheduled next Monday.  Dental exam: Completes annually  - dentures- upper only  Mammogram: Completed today Bone Density Scan: scheduled for 04/25/24  Colonoscopy: has the cologuard, she will complete it this week.    Patient Active Problem List   Diagnosis Date Noted   Acute cough 03/24/2024   Screening for colon cancer 03/24/2024   Heart failure with improved ejection fraction (HFimpEF) (HCC) 03/23/2024   Status post surgery 09/26/2021   Incarcerated ventral hernia 09/26/2021   Encounter for screening and preventative care 05/19/2021   COVID-19 virus infection 05/12/2021   Type 2 diabetes mellitus (HCC) 06/16/2019   Shortness of breath 03/11/2019   Coronary artery disease involving native coronary artery of native heart without angina pectoris 01/18/2019   Ischemic cardiomyopathy 01/18/2019   Paroxysmal atrial fibrillation (HCC) 06/22/2018   Ventricular tachycardia (HCC) 06/22/2018   Class 3 severe obesity due to excess calories with serious comorbidity and body mass index (BMI) of 40.0 to 44.9 in adult 06/22/2018   Essential hypertension 06/06/2018   Hyperlipidemia LDL goal <70 06/06/2018   Hypokalemia 06/06/2018   Past Medical History:  Diagnosis Date   Allergy 1978   Arthritis 2010   CAD (coronary artery disease)    a. 05/2018 Ant STEMI/PCI: LM nl, LAD 75m(2.75 x 16 Synergy DES), D2 99 (PTCA), RI 40ost,  LCX mild diff dzs, OM1/2/3 nl, RCA large, diffuse dzs, RPDA nl, RPAV 60, RPLB1 80 (small).   Diabetes (HCC)    HFrEF (heart failure with reduced ejection fraction) (HCC)    a. 05/2018 Echo: EF 40-45%, mod LVH, Gr2 DD, mid antsept, apical septal, apical AK. Mid-apical ant, mid infsept HK. Nl RV fxn. Triv TR. b. Echo 08/2018: mod-sev. LVH, EF 60-65%, Grade 1 DD, AV-mild calcification, mild MR, mild dialation As. Aorta.   Hyperlipidemia    Hypertension    Ischemic cardiomyopathy    a. 05/2018 Echo: EF 40-45%. b. 08/2018 Echo: EF 60-65%   PAF (paroxysmal atrial fibrillation) (HCC)    a. 05/2018 at time of STEMI, converted on Amio. No OAC.   STEMI involving left anterior descending coronary artery (HCC)    a. 05/2018 - complicated by CGS req IABP and VT req amiodarone .   V-tach (HCC)    a. 05/2018 at time of Ant STEMI-->Amio.   Past Surgical History:  Procedure Laterality Date   APPENDECTOMY     CARDIAC CATHETERIZATION     CHOLECYSTECTOMY OPEN     CORONARY/GRAFT ACUTE MI REVASCULARIZATION N/A 05/31/2018   Procedure: Coronary/Graft Acute MI Revascularization;  Surgeon: Sammy Crisp, MD;  Location: [facility];  Service: Cardiovascular;  Laterality: N/A;   HERNIA REPAIR  2022   IABP INSERTION N/A 05/31/2018   Procedure: IABP Insertion;  Surgeon: Sammy Crisp, MD;  Location: [facility];  Service: Cardiovascular;  Laterality: N/A;   INSERTION OF MESH N/A 09/26/2021   Procedure: INSERTION OF  MESH;  Surgeon: Lujean Sake, MD;  Location[facility];  Service: General;  Laterality: N/A;   LEFT HEART CATH AND CORONARY ANGIOGRAPHY N/A 05/31/2018   Procedure: LEFT HEART CATH AND CORONARY ANGIOGRAPHY;  Surgeon: Sammy Crisp, MD;  Location: [facility];  Service: Cardiovascular;  Laterality: N/A;   UMBILICAL HERNIA REPAIR N/A 09/26/2021   Procedure: HERNIA REPAIR UMBILICAL ADULT;  Surgeon: Lujean Sake, MD;  Location: [facility] OR;  Service: General;  Laterality: N/A;   Allergies   Allergen Reactions   Codeine Swelling    Mouth and tongue swelling (couldn't swallow)   Darvon [Propoxyphene Hcl] Swelling    Mouth and tongue swelling (couldn't swallow)   Penicillins Hives    Has patient had a PCN reaction causing immediate rash, facial/tongue/throat swelling, SOB or lightheadedness with hypotension: Yes Has patient had a PCN reaction causing severe rash involving mucus membranes or skin necrosis: No Has patient had a PCN reaction that required hospitalization: No Has patient had a PCN reaction occurring within the last 10 years: No If all of the above answers are "NO", then may proceed with Cephalosporin use.         04/14/2024    2:03 PM 03/24/2024    9:25 AM 01/30/2022    8:22 AM  Depression screen PHQ 2/9  Decreased Interest 1 0 0  Down, Depressed, Hopeless 1 0 0  PHQ - 2 Score 2 0 0  Altered sleeping 2 0   Tired, decreased energy 1 1   Change in appetite 1 0   Feeling bad or failure about yourself  0 0   Trouble concentrating 0 0   Moving slowly or fidgety/restless 0 0   Suicidal thoughts 0 0   PHQ-9 Score 6 1   Difficult doing work/chores Not difficult at all Not difficult at all        04/14/2024    2:03 PM 03/24/2024    9:25 AM  GAD 7 : Generalized Anxiety Score  Nervous, Anxious, on Edge 1 0  Control/stop worrying 0 0  Worry too much - different things 1 0  Trouble relaxing 0 0  Restless 0 0  Easily annoyed or irritable 0 0  Afraid - awful might happen 0 0  Total GAD 7 Score 2 0  Anxiety Difficulty Not difficult at all Not difficult at all      Review of Systems  Constitutional:  Negative for chills, fever, malaise/fatigue and weight loss.  HENT:  Negative for congestion, ear discharge, ear pain, hearing loss, nosebleeds, sinus pain, sore throat and tinnitus.   Eyes:  Negative for blurred vision, double vision, pain, discharge and redness.  Respiratory:  Negative for cough, shortness of breath, wheezing and stridor.   Cardiovascular:   Negative for chest pain, palpitations and leg swelling.  Gastrointestinal:  Negative for abdominal pain, constipation, diarrhea, heartburn, nausea and vomiting.  Genitourinary:  Negative for dysuria, frequency and urgency.  Musculoskeletal:  Negative for myalgias.  Skin:  Negative for rash.  Neurological:  Negative for dizziness, tingling, seizures, weakness and headaches.  Endo/Heme/Allergies:  Negative for polydipsia.  Psychiatric/Behavioral:  Negative for depression, substance abuse and suicidal ideas. The patient is not nervous/anxious.       Objective:     BP 130/68 (BP Location: Left Arm, Patient Position: Sitting, Cuff Size: Normal)   Pulse 71   Temp 98.7 F (37.1 C) (Oral)   Ht 5\' 1"  (1.549 m)   Wt 210 lb (95.3 kg)  SpO2 96%   BMI 39.68 kg/m  BP Readings from Last 3 Encounters:  04/14/24 130/68  03/24/24 132/82  03/21/24 128/76   Wt Readings from Last 3 Encounters:  04/14/24 210 lb (95.3 kg)  03/24/24 212 lb (96.2 kg)  03/21/24 211 lb 6.4 oz (95.9 kg)      Physical Exam Vitals and nursing note reviewed.  Constitutional:      Appearance: Normal appearance.  HENT:     Head: Normocephalic and atraumatic.     Right Ear: Tympanic membrane, ear canal and external ear normal.     Left Ear: Tympanic membrane, ear canal and external ear normal.     Nose: Nose normal.     Mouth/Throat:     Mouth: Mucous membranes are moist.     Pharynx: Oropharynx is clear.  Eyes:     Conjunctiva/sclera: Conjunctivae normal.     Pupils: Pupils are equal, round, and reactive to light.  Cardiovascular:     Rate and Rhythm: Normal rate and regular rhythm.     Pulses: Normal pulses.     Heart sounds: Normal heart sounds.  Pulmonary:     Effort: Pulmonary effort is normal.     Breath sounds: Normal breath sounds.  Abdominal:     General: Abdomen is flat. Bowel sounds are normal.     Palpations: Abdomen is soft.  Musculoskeletal:        General: Normal range of motion.      Cervical back: Normal range of motion.  Skin:    General: Skin is warm and dry.     Capillary Refill: Capillary refill takes less than 2 seconds.  Neurological:     General: No focal deficit present.     Mental Status: She is alert and oriented to person, place, and time. Mental status is at baseline.  Psychiatric:        Mood and Affect: Mood normal.        Behavior: Behavior normal.        Thought Content: Thought content normal.        Judgment: Judgment normal.      No results found for any visits on 04/14/24.     The ASCVD Risk score (Arnett DK, et al., 2019) failed to calculate for the following reasons:   Risk score cannot be calculated because patient has a medical history suggesting prior/existing ASCVD    Assessment & Plan:  Encounter for screening and preventative care Assessment & Plan:  >>ASSESSMENT AND PLAN FOR PREVENTATIVE HEALTH CARE WRITTEN ON 04/14/2024  2:27 PM BY Jolanda Nation, NP  Immunizations due to tdap and shingrix. Pneumonia vaccine UTD. Mammogram UTD Colonoscopy due - cologuard she will complete.    Discussed the importance of a healthy diet and regular exercise in order for weight loss, and to reduce the risk of further co-morbidity.  Exam stable. Reviewed labs with patient.  Follow up in 1 year for repeat physical.    Type 2 diabetes mellitus with hyperglycemia, unspecified whether long term insulin  use (HCC) Assessment & Plan: Hemoglobin A1c- 6.7.  Worsening.   Discussed treatment options at length. She would like to start metformin .   Start metformin  xr 500 mg once daily. Rx sent. Rx sent for glucometer. Handout provided for diabetes diet.  Foot exam completed today.  Urine acr pending- she will bring it Friday.  Scheduled for diabetic eye exam.  Pneumonia vaccine UTD.  Follow up in 3 months.   Orders: -  metFORMIN  HCl ER; Take 1 tablet (500 mg total) by mouth daily with breakfast.  Dispense: 90 tablet; Refill: 0 -     Blood  Glucose Monitoring Suppl; 1 each by Does not apply route in the morning, at noon, and at bedtime. May substitute to any manufacturer covered by patient's insurance.  Dispense: 1 each; Refill: 0 -     Blood Glucose Test; 1 each by In Vitro route in the morning, at noon, and at bedtime. May substitute to any manufacturer covered by patient's insurance.  Dispense: 90 each; Refill: 0 -     Lancet Device; 1 each by Does not apply route in the morning, at noon, and at bedtime. May substitute to any manufacturer covered by patient's insurance.  Dispense: 1 each; Refill: 0 -     Lancets Misc.; 1 each by Does not apply route in the morning, at noon, and at bedtime. May substitute to any manufacturer covered by patient's insurance.  Dispense: 100 each; Refill: 0   Return in about 3 months (around 07/15/2024) for Daibetes mellitus.    Jolanda Nation, NP

## 2024-04-20 ENCOUNTER — Other Ambulatory Visit: Payer: Self-pay | Admitting: Medical

## 2024-04-20 DIAGNOSIS — E785 Hyperlipidemia, unspecified: Secondary | ICD-10-CM

## 2024-04-25 ENCOUNTER — Other Ambulatory Visit

## 2024-05-14 ENCOUNTER — Other Ambulatory Visit: Payer: Self-pay | Admitting: General Practice

## 2024-05-14 DIAGNOSIS — E1165 Type 2 diabetes mellitus with hyperglycemia: Secondary | ICD-10-CM

## 2024-05-15 ENCOUNTER — Other Ambulatory Visit: Payer: Self-pay | Admitting: General Practice

## 2024-05-15 DIAGNOSIS — E1165 Type 2 diabetes mellitus with hyperglycemia: Secondary | ICD-10-CM

## 2024-06-03 ENCOUNTER — Other Ambulatory Visit: Payer: Self-pay

## 2024-06-03 ENCOUNTER — Encounter: Payer: Self-pay | Admitting: General Practice

## 2024-06-03 DIAGNOSIS — Z780 Asymptomatic menopausal state: Secondary | ICD-10-CM

## 2024-06-03 NOTE — Addendum Note (Signed)
 Addended by: SEBASTIAN DANNA GRADE on: 06/03/2024 05:13 PM   Modules accepted: Orders

## 2024-06-09 ENCOUNTER — Other Ambulatory Visit

## 2024-06-12 ENCOUNTER — Other Ambulatory Visit: Payer: Self-pay | Admitting: Internal Medicine

## 2024-06-12 DIAGNOSIS — I480 Paroxysmal atrial fibrillation: Secondary | ICD-10-CM

## 2024-06-12 NOTE — Telephone Encounter (Signed)
 Pt last saw Dr End 03/21/24, last labs 04/11/24 Creat 0.73, age 73, weight 95.3kg, based on specified criteria pt is on appropriate dosage of Eliquis  5mg  BID for afib.  Will refill rx.

## 2024-06-17 ENCOUNTER — Ambulatory Visit: Admitting: General Practice

## 2024-06-27 ENCOUNTER — Ambulatory Visit (INDEPENDENT_AMBULATORY_CARE_PROVIDER_SITE_OTHER)

## 2024-06-27 VITALS — Ht 61.0 in | Wt 200.0 lb

## 2024-06-27 DIAGNOSIS — Z0000 Encounter for general adult medical examination without abnormal findings: Secondary | ICD-10-CM | POA: Diagnosis not present

## 2024-06-27 NOTE — Patient Instructions (Signed)
 Ms. Lindsey Pierce , Thank you for taking time out of your busy schedule to complete your Annual Wellness Visit with me. I enjoyed our conversation and look forward to speaking with you again next year. I, as well as your care team,  appreciate your ongoing commitment to your health goals. Please review the following plan we discussed and let me know if I can assist you in the future. Your Game plan/ To Do List    Referrals: If you haven't heard from the office you've been referred to, please reach out to them at the phone provided.   Follow up Visits: We will see or speak with you next year for your Next Medicare AWV with our clinical staff-07/03/25 @ 9:30am televisit Have you seen your provider in the last 6 months (3 months if uncontrolled diabetes)? Yes  Clinician Recommendations:  Aim for 30 minutes of exercise or brisk walking, 6-8 glasses of water, and 5 servings of fruits and vegetables each day.       This is a list of the screenings recommended for you:  Health Maintenance  Topic Date Due   Yearly kidney health urinalysis for diabetes  Never done   DTaP/Tdap/Td vaccine (1 - Tdap) Never done   Cologuard (Stool DNA test)  Never done   Zoster (Shingles) Vaccine (1 of 2) Never done   Eye exam for diabetics  08/31/2022   Flu Shot  06/13/2024   COVID-19 Vaccine (5 - 2024-25 season) 04/09/2028*   Hemoglobin A1C  10/12/2024   Yearly kidney function blood test for diabetes  04/11/2025   Complete foot exam   04/14/2025   Medicare Annual Wellness Visit  06/27/2025   Mammogram  04/14/2026   Pneumococcal Vaccine for age over 31  Completed   DEXA scan (bone density measurement)  Completed   Hepatitis C Screening  Completed   HPV Vaccine  Aged Out   Meningitis B Vaccine  Aged Out   Pneumococcal Vaccine  Discontinued  *Topic was postponed. The date shown is not the original due date.    Advanced directives: (Copy Requested) Please bring a copy of your health care power of attorney and living  will to the office to be added to your chart at your convenience. You can mail to [facility] [address] [place], [place] [address] or email to [email or web address] Advance Care Planning is important because it:  [x]  Makes sure you receive the medical care that is consistent with your values, goals, and preferences  [x]  It provides guidance to your family and loved ones and reduces their decisional burden about whether or not they are making the right decisions based on your wishes.  Follow the link provided in your after visit summary or read over the paperwork we have mailed to you to help you started getting your Advance Directives in place. If you need assistance in completing these, please reach out to us  so that we can help you!

## 2024-06-27 NOTE — Progress Notes (Signed)
 Subjective:   Lindsey Pierce is a 73 y.o. who presents for a Medicare Wellness preventive visit.  As a reminder, Annual Wellness Visits don't include a physical exam, and some assessments may be limited, especially if this visit is performed virtually. We may recommend an in-person follow-up visit with your provider if needed.  Visit Complete: Virtual I connected with  Lindsey Pierce on 06/27/24 by a audio enabled telemedicine application and verified that I am speaking with the correct person using two identifiers.  Patient Location: Home  Provider Location: Office/Clinic  I discussed the limitations of evaluation and management by telemedicine. The patient expressed understanding and agreed to proceed.  Vital Signs: Because this visit was a virtual/telehealth visit, some criteria may be missing or patient reported. Any vitals not documented were not able to be obtained and vitals that have been documented are patient reported.  VideoDeclined- This patient declined Librarian, academic. Therefore the visit was completed with audio only.  Persons Participating in Visit: Patient.  AWV Questionnaire: Yes: Patient Medicare AWV questionnaire was completed by the patient on 06/23/24; I have confirmed that all information answered by patient is correct and no changes since this date.  Cardiac Risk Factors include: advanced age (>77men, >90 women);diabetes mellitus;dyslipidemia;hypertension;obesity (BMI >30kg/m2)     Objective:    Today's Vitals   06/27/24 0916  Weight: 200 lb (90.7 kg)  Height: 5' 1 (1.549 m)   Body mass index is 37.79 kg/m.     06/27/2024    9:24 AM 03/25/2023    2:36 PM 01/30/2022    8:24 AM 09/26/2021    3:29 PM 09/26/2021    3:00 PM 04/26/2020    9:02 AM 07/04/2018    1:05 PM  Advanced Directives  Does Patient Have a Medical Advance Directive? Yes Yes Yes  No No Yes   Type of Estate agent of  [place];Living will  Healthcare Power of [place];Living will    Healthcare Power of Attorney  Does patient want to make changes to medical advance directive?       No - Patient declined   Copy of Healthcare Power of Attorney in Chart? No - copy requested  No - copy requested    No - copy requested   Would patient like information on creating a medical advance directive?    No - Patient declined  No - Patient declined      Data saved with a previous flowsheet row definition    Current Medications (verified) Outpatient Encounter Medications as of 06/27/2024  Medication Sig   apixaban  (ELIQUIS ) 5 MG TABS tablet TAKE 1 TABLET BY MOUTH TWICE A DAY   atorvastatin  (LIPITOR ) 80 MG tablet TAKE 1 TABLET BY MOUTH EVERY DAY AT 6PM   Blood Glucose Monitoring Suppl DEVI 1 each by Does not apply route in the morning, at noon, and at bedtime. May substitute to any manufacturer covered by patient's insurance.   carvedilol  (COREG ) 12.5 MG tablet Take 1 tablet (12.5 mg total) by mouth 2 (two) times daily.   Lancets (ONETOUCH DELICA PLUS LANCET33G) MISC 1 EACH BY DOES NOT APPLY ROUTE IN THE MORNING, AT NOON, AND AT BEDTIME   losartan  (COZAAR ) 25 MG tablet TAKE 1 TABLET (25 MG TOTAL) BY MOUTH DAILY.   metFORMIN  (GLUCOPHAGE -XR) 500 MG 24 hr tablet Take 1 tablet (500 mg total) by mouth daily with breakfast.   Multiple Vitamin (MULTIVITAMIN WITH MINERALS) TABS Take 1 tablet by mouth daily.  ONETOUCH VERIO test strip 1 EACH BY IN VITRO ROUTE IN THE MORNING, AT NOON, AND AT BEDTIME   No facility-administered encounter medications on file as of 06/27/2024.    Allergies (verified) Codeine, Darvon [propoxyphene hcl], and Penicillins   History: Past Medical History:  Diagnosis Date   Allergy 1978   Arthritis 2010   CAD (coronary artery disease)    a. 05/2018 Ant STEMI/PCI: LM nl, LAD 21m(2.75 x 16 Synergy DES), D2 99 (PTCA), RI 40ost, LCX mild diff dzs, OM1/2/3 nl, RCA large, diffuse dzs, RPDA nl, RPAV 60, RPLB1  80 (small).   Depression    Diabetes (HCC)    HFrEF (heart failure with reduced ejection fraction) (HCC)    a. 05/2018 Echo: EF 40-45%, mod LVH, Gr2 DD, mid antsept, apical septal, apical AK. Mid-apical ant, mid infsept HK. Nl RV fxn. Triv TR. b. Echo 08/2018: mod-sev. LVH, EF 60-65%, Grade 1 DD, AV-mild calcification, mild MR, mild dialation As. Aorta.   Hyperlipidemia    Hypertension    Ischemic cardiomyopathy    a. 05/2018 Echo: EF 40-45%. b. 08/2018 Echo: EF 60-65%   PAF (paroxysmal atrial fibrillation) (HCC)    a. 05/2018 at time of STEMI, converted on Amio. No OAC.   STEMI involving left anterior descending coronary artery (HCC)    a. 05/2018 - complicated by CGS req IABP and VT req amiodarone .   V-tach (HCC)    a. 05/2018 at time of Ant STEMI-->Amio.   Past Surgical History:  Procedure Laterality Date   APPENDECTOMY     CARDIAC CATHETERIZATION     CHOLECYSTECTOMY OPEN     CORONARY/GRAFT ACUTE MI REVASCULARIZATION N/A 05/31/2018   Procedure: Coronary/Graft Acute MI Revascularization;  Surgeon: Mady Bruckner, MD;  [facility];  Service: Cardiovascular;  Laterality: N/A;   HERNIA REPAIR  2022   IABP INSERTION N/A 05/31/2018   Procedure: IABP Insertion;  Surgeon: Mady Bruckner, MD;  Location: [facility];  Service: Cardiovascular;  Laterality: N/A;   INSERTION OF MESH N/A 09/26/2021   Procedure: INSERTION OF MESH;  Surgeon: Dasie Leonor CROME, MD;  [facility];  Service: General;  Laterality: N/A;   LEFT HEART CATH AND CORONARY ANGIOGRAPHY N/A 05/31/2018   Procedure: LEFT HEART CATH AND CORONARY ANGIOGRAPHY;  Surgeon: Mady Bruckner, MD;  [facility];  Service: Cardiovascular;  Laterality: N/A;   UMBILICAL HERNIA REPAIR N/A 09/26/2021   Procedure: HERNIA REPAIR UMBILICAL ADULT;  Surgeon: Dasie Leonor CROME, MD;  Location: [facility] OR;  Service: General;  Laterality: N/A;   Family History  Problem Relation Age of Onset   Valvular heart disease  Mother    Heart disease Mother    Arthritis Father    Hypertension Father    Social History   Socioeconomic History   Marital status: Divorced    Spouse name: Not on file   Number of children: Not on file   Years of education: Not on file   Highest education level: Some college, no degree  Occupational History   Not on file  Tobacco Use   Smoking status: Never   Smokeless tobacco: Never  Vaping Use   Vaping status: Never Used  Substance and Sexual Activity   Alcohol use: Not Currently    Alcohol/week: 1.0 standard drink of alcohol    Types: 1 Glasses of wine per week    Comment: Occasional glass of wine   Drug use: No   Sexual activity: Not Currently  Birth control/protection: Post-menopausal  Other Topics Concern   Not on file  Social History Narrative   Not on file   Social Drivers of Health   Financial Resource Strain: Low Risk  (06/23/2024)   Overall Financial Resource Strain (CARDIA)    Difficulty of Paying Living Expenses: Not very hard  Food Insecurity: No Food Insecurity (06/23/2024)   Hunger Vital Sign    Worried About Running Out of Food in the Last Year: Never true    Ran Out of Food in the Last Year: Never true  Transportation Needs: No Transportation Needs (06/23/2024)   PRAPARE - Administrator, Civil Service (Medical): No    Lack of Transportation (Non-Medical): No  Physical Activity: Sufficiently Active (06/23/2024)   Exercise Vital Sign    Days of Exercise per Week: 5 days    Minutes of Exercise per Session: 30 min  Stress: No Stress Concern Present (06/23/2024)   [employer] of Occupational Health - Occupational Stress Questionnaire    Feeling of Stress: Only a little  Social Connections: Moderately Integrated (06/23/2024)   Social Connection and Isolation Panel    Frequency of Communication with Friends and Family: More than three times a week    Frequency of Social Gatherings with Friends and Family: More than three times a week     Attends Religious Services: More than 4 times per year    Active Member of [employer] or Organizations: Yes    Attends Engineer, structural: More than 4 times per year    Marital Status: Divorced    Tobacco Counseling Counseling given: Not Answered    Clinical Intake:  Pre-visit preparation completed: Yes  Pain : No/denies pain     BMI - recorded: 37.79 Nutritional Status: BMI > 30  Obese Nutritional Risks: None Diabetes: Yes CBG done?: Yes CBG resulted in Enter/ Edit results?: No Did pt. bring in CBG monitor from home?: No  Lab Results  Component Value Date   HGBA1C 6.7 (H) 04/11/2024   HGBA1C 5.9 (H) 09/27/2021   HGBA1C 6.7 (H) 05/19/2021     How often do you need to have someone help you when you read instructions, pamphlets, or other written materials from your doctor or pharmacy?: 1 - Never  Interpreter Needed?: No  Comments: lives alone Information entered by :: B.Amara Justen,LPN   Activities of Daily Living     06/23/2024   11:37 AM  In your present state of health, do you have any difficulty performing the following activities:  Hearing? 0  Vision? 0  Difficulty concentrating or making decisions? 0  Walking or climbing stairs? 0  Dressing or bathing? 0  Doing errands, shopping? 0  Preparing Food and eating ? N  Using the Toilet? N  In the past six months, have you accidently leaked urine? Y  Do you have problems with loss of bowel control? N  Managing your Medications? N  Managing your Finances? N  Housekeeping or managing your Housekeeping? N    Patient Care Team: Vincente Shivers, NP as PCP - General (General Practice) End, Lonni, MD as PCP - Cardiology (Cardiology) [place], Heather, OD (Optometry)  I have updated your Care Teams any recent Medical Services you may have received from other providers in the past year.     Assessment:   This is a routine wellness examination for Lindsey Pierce.  Hearing/Vision screen Hearing Screening -  Comments:: Pt says her hearing is good Vision Screening - Comments:: Pt says her vision is  good w/glasses Dr [place]   Goals Addressed             This Visit's Progress    Patient Stated   On track    06/27/24-, I will continue walking 6-7,000 steps a day.        Depression Screen     06/27/2024    9:23 AM 04/14/2024    2:03 PM 03/24/2024    9:25 AM 01/30/2022    8:22 AM 05/12/2021   12:08 PM 04/26/2020    9:04 AM 01/21/2020    8:08 AM  PHQ 2/9 Scores  PHQ - 2 Score 0 2 0 0 0 0 2  PHQ- 9 Score  6 1  0 0 6    Fall Risk     06/23/2024   11:37 AM 04/14/2024    2:03 PM 03/24/2024    9:25 AM 01/30/2022    8:25 AM 05/19/2021    8:02 AM  Fall Risk   Falls in the past year? 0 0 0 0 0  Number falls in past yr: 0 0 0 0 0  Injury with Fall? 0 0 0 0 0  Risk for fall due to : No Fall Risks No Fall Risks No Fall Risks No Fall Risks   Follow up Falls prevention discussed;Education provided Falls evaluation completed Falls evaluation completed Falls prevention discussed       Data saved with a previous flowsheet row definition    MEDICARE RISK AT HOME:  Medicare Risk at Home Any stairs in or around the home?: (Patient-Rptd) Yes If so, are there any without handrails?: (Patient-Rptd) No Home free of loose throw rugs in walkways, pet beds, electrical cords, etc?: (Patient-Rptd) Yes Adequate lighting in your home to reduce risk of falls?: (Patient-Rptd) Yes Life alert?: (Patient-Rptd) No Use of a cane, walker or w/c?: (Patient-Rptd) No Grab bars in the bathroom?: (Patient-Rptd) Yes Shower chair or bench in shower?: (Patient-Rptd) No Elevated toilet seat or a handicapped toilet?: (Patient-Rptd) No  TIMED UP AND GO:  Was the test performed?  No  Cognitive Function: 6CIT completed    04/26/2020    9:06 AM  MMSE - Mini Mental State Exam  Not completed: Refused        06/27/2024    9:26 AM 01/30/2022    8:29 AM  6CIT Screen  What Year? 0 points 0 points  What month? 0 points 0 points   What time? 0 points 0 points  Count back from 20 0 points 2 points  Months in reverse 0 points 0 points  Repeat phrase 0 points 0 points  Total Score 0 points 2 points    Immunizations Immunization History  Administered Date(s) Administered   Influenza, High Dose Seasonal PF 08/24/2018   Influenza-Unspecified 08/24/2018, 10/06/2020, 09/15/2021   Moderna Sars-Covid-2 Vaccination 12/26/2019, 01/21/2020, 10/06/2020, 06/05/2021   Pneumococcal Conjugate-13 11/18/2018   Pneumococcal Polysaccharide-23 04/28/2020    Screening Tests Health Maintenance  Topic Date Due   Diabetic kidney evaluation - Urine ACR  Never done   DTaP/Tdap/Td (1 - Tdap) Never done   Fecal DNA (Cologuard)  Never done   Zoster Vaccines- Shingrix (1 of 2) Never done   INFLUENZA VACCINE  06/13/2024   COVID-19 Vaccine (5 - 2024-25 season) 04/09/2028 (Originally 07/15/2023)   HEMOGLOBIN A1C  10/12/2024   Diabetic kidney evaluation - eGFR measurement  04/11/2025   FOOT EXAM  04/14/2025   OPHTHALMOLOGY EXAM  06/16/2025   Medicare Annual Wellness (AWV)  06/27/2025  MAMMOGRAM  04/14/2026   Pneumococcal Vaccine: 50+ Years  Completed   DEXA SCAN  Completed   Hepatitis C Screening  Completed   HPV VACCINES  Aged Out   Meningococcal B Vaccine  Aged Out   Pneumococcal Vaccine  Discontinued    Health Maintenance  Health Maintenance Due  Topic Date Due   Diabetic kidney evaluation - Urine ACR  Never done   DTaP/Tdap/Td (1 - Tdap) Never done   Fecal DNA (Cologuard)  Never done   Zoster Vaccines- Shingrix (1 of 2) Never done   INFLUENZA VACCINE  06/13/2024   Health Maintenance Items Addressed: Dexa Scan scheduled for 07/28/24 Pt has Cologuard to complete-says she will do and send back  Additional Screening:  Vision Screening: Recommended annual ophthalmology exams for early detection of glaucoma and other disorders of the eye. Would you like a referral to an eye doctor? No    Dental Screening: Recommended  annual dental exams for proper oral hygiene  Community Resource Referral / Chronic Care Management: CRR required this visit?  No   CCM required this visit?  No   Plan:    I have personally reviewed and noted the following in the patient's chart:   Medical and social history Use of alcohol, tobacco or illicit drugs  Current medications and supplements including opioid prescriptions. Patient is not currently taking opioid prescriptions. Functional ability and status Nutritional status Physical activity Advanced directives List of other physicians Hospitalizations, surgeries, and ER visits in previous 12 months Vitals Screenings to include cognitive, depression, and falls Referrals and appointments  In addition, I have reviewed and discussed with patient certain preventive protocols, quality metrics, and best practice recommendations. A written personalized care plan for preventive services as well as general preventive health recommendations were provided to patient.   Erminio LITTIE Saris, LPN   1/84/7974   After Visit Summary: (MyChart) Due to this being a telephonic visit, the after visit summary with patients personalized plan was offered to patient via MyChart   Notes: Pt relays she wants to talk about her blood sugars which are highest in the morning after 12hr fasting (130)  and she wants to talk about the Metformin . She continues on her weight loss journey.

## 2024-07-06 ENCOUNTER — Other Ambulatory Visit: Payer: Self-pay | Admitting: Medical

## 2024-07-10 ENCOUNTER — Other Ambulatory Visit: Payer: Self-pay | Admitting: General Practice

## 2024-07-10 DIAGNOSIS — E1165 Type 2 diabetes mellitus with hyperglycemia: Secondary | ICD-10-CM

## 2024-07-21 ENCOUNTER — Ambulatory Visit: Admitting: General Practice

## 2024-07-25 ENCOUNTER — Ambulatory Visit: Admitting: General Practice

## 2024-07-25 DIAGNOSIS — E1165 Type 2 diabetes mellitus with hyperglycemia: Secondary | ICD-10-CM

## 2024-07-28 ENCOUNTER — Other Ambulatory Visit

## 2024-08-01 ENCOUNTER — Ambulatory Visit: Payer: Self-pay | Admitting: General Practice

## 2024-08-01 ENCOUNTER — Ambulatory Visit: Admitting: General Practice

## 2024-08-01 ENCOUNTER — Encounter: Payer: Self-pay | Admitting: General Practice

## 2024-08-01 VITALS — BP 124/82 | HR 77 | Temp 98.2°F | Ht 61.0 in | Wt 204.2 lb

## 2024-08-01 DIAGNOSIS — E1165 Type 2 diabetes mellitus with hyperglycemia: Secondary | ICD-10-CM | POA: Diagnosis not present

## 2024-08-01 LAB — POCT GLYCOSYLATED HEMOGLOBIN (HGB A1C): Hemoglobin A1C: 6.4 % — AB (ref 4.0–5.6)

## 2024-08-01 NOTE — Assessment & Plan Note (Signed)
 Hemoglobin A1c 6.4 today. Commended patient on progress.   Pneumonia vaccine UTD.  Eye exam UTD Foot exam UTD Urine ACR- due Statin therapy- atorvastatin .   Follow up in 3 months.

## 2024-08-01 NOTE — Progress Notes (Signed)
 Established Patient Office Visit  Subjective   Patient ID: Lindsey Pierce, female    DOB: [birth year]  Age: 73 y.o. MRN: [id number]  Chief Complaint  Patient presents with   Diabetes    With A1c check    HPI  Lindsey Pierce is a 73 year old female with past medical history of HTN, A fib, type 2 DM, HLD presents today for a diabetes follow up.   Current medications include: Metformin  XR 500 mg once daily.   She is checking her blood glucose 3 times daily and is getting readings of 90s-120s post prandial; however her fasting readings 130s-140s.  Last A1C: 6.7 in May 30,25 Last Eye Exam: UTD. Last Foot Exam: UTD. Pneumonia Vaccination:UTD Urine Microalbumin: due Statin: Atorvastatin    Dietary changes since last visit: has been monitoring diet by cutting out sugars, wheat and bread.   Exercise: walking 20 minutes TID; weight lifting.   Patient Active Problem List   Diagnosis Date Noted   Screening for colon cancer 03/24/2024   Heart failure with improved ejection fraction (HFimpEF) (HCC) 03/23/2024   Status post surgery 09/26/2021   Incarcerated ventral hernia 09/26/2021   Type 2 diabetes mellitus (HCC) 06/16/2019   Coronary artery disease involving native coronary artery of native heart without angina pectoris 01/18/2019   Ischemic cardiomyopathy 01/18/2019   Paroxysmal atrial fibrillation (HCC) 06/22/2018   Ventricular tachycardia (HCC) 06/22/2018   Essential hypertension 06/06/2018   Hyperlipidemia LDL goal <70 06/06/2018   Hypokalemia 06/06/2018   Past Medical History:  Diagnosis Date   Allergy 1978   Arthritis 2010   CAD (coronary artery disease)    a. 05/2018 Ant STEMI/PCI: LM nl, LAD 29m(2.75 x 16 Synergy DES), D2 99 (PTCA), RI 40ost, LCX mild diff dzs, OM1/2/3 nl, RCA large, diffuse dzs, RPDA nl, RPAV 60, RPLB1 80 (small).   Depression    Diabetes (HCC)    HFrEF (heart failure with reduced ejection fraction) (HCC)    a. 05/2018 Echo: EF 40-45%, mod LVH, Gr2  DD, mid antsept, apical septal, apical AK. Mid-apical ant, mid infsept HK. Nl RV fxn. Triv TR. b. Echo 08/2018: mod-sev. LVH, EF 60-65%, Grade 1 DD, AV-mild calcification, mild MR, mild dialation As. Aorta.   Hyperlipidemia    Hypertension    Ischemic cardiomyopathy    a. 05/2018 Echo: EF 40-45%. b. 08/2018 Echo: EF 60-65%   PAF (paroxysmal atrial fibrillation) (HCC)    a. 05/2018 at time of STEMI, converted on Amio. No OAC.   STEMI involving left anterior descending coronary artery (HCC)    a. 05/2018 - complicated by CGS req IABP and VT req amiodarone .   V-tach (HCC)    a. 05/2018 at time of Ant STEMI-->Amio.   Past Surgical History:  Procedure Laterality Date   APPENDECTOMY     CARDIAC CATHETERIZATION     CHOLECYSTECTOMY OPEN     CORONARY/GRAFT ACUTE MI REVASCULARIZATION N/A 05/31/2018   Procedure: Coronary/Graft Acute MI Revascularization;  Surgeon: Mady Bruckner, MD;  [facility];  Service: Cardiovascular;  Laterality: N/A;   HERNIA REPAIR  2022   IABP INSERTION N/A 05/31/2018   Procedure: IABP Insertion;  Surgeon: Mady Bruckner, MD;  [facility];  Service: Cardiovascular;  Laterality: N/A;   INSERTION OF MESH N/A 09/26/2021   Procedure: INSERTION OF MESH;  Surgeon: Dasie Leonor CROME, MD;  Location: [facility];  Service: General;  Laterality: N/A;   LEFT HEART CATH AND CORONARY ANGIOGRAPHY N/A 05/31/2018  Procedure: LEFT HEART CATH AND CORONARY ANGIOGRAPHY;  Surgeon: Mady Bruckner, MD;  [facility];  Service: Cardiovascular;  Laterality: N/A;   UMBILICAL HERNIA REPAIR N/A 09/26/2021   Procedure: HERNIA REPAIR UMBILICAL ADULT;  Surgeon: Dasie Leonor CROME, MD;  Location: [facility];  Service: General;  Laterality: N/A;   Allergies  Allergen Reactions   Codeine Swelling    Mouth and tongue swelling (couldn't swallow)   Darvon [Propoxyphene Hcl] Swelling    Mouth and tongue swelling (couldn't swallow)   Penicillins Hives    Has patient  had a PCN reaction causing immediate rash, facial/tongue/throat swelling, SOB or lightheadedness with hypotension: Yes Has patient had a PCN reaction causing severe rash involving mucus membranes or skin necrosis: No Has patient had a PCN reaction that required hospitalization: No Has patient had a PCN reaction occurring within the last 10 years: No If all of the above answers are NO, then may proceed with Cephalosporin use.         08/01/2024    2:21 PM 06/27/2024    9:23 AM 04/14/2024    2:03 PM  Depression screen PHQ 2/9  Decreased Interest 0 0 1  Down, Depressed, Hopeless 0 0 1  PHQ - 2 Score 0 0 2  Altered sleeping 0  2  Tired, decreased energy 0  1  Change in appetite 0  1  Feeling bad or failure about yourself  0  0  Trouble concentrating 0  0  Moving slowly or fidgety/restless 0  0  Suicidal thoughts 0  0  PHQ-9 Score 0  6  Difficult doing work/chores Not difficult at all  Not difficult at all       08/01/2024    2:22 PM 04/14/2024    2:03 PM 03/24/2024    9:25 AM  GAD 7 : Generalized Anxiety Score  Nervous, Anxious, on Edge 0 1 0  Control/stop worrying 0 0 0  Worry too much - different things 0 1 0  Trouble relaxing 0 0 0  Restless 0 0 0  Easily annoyed or irritable 0 0 0  Afraid - awful might happen 0 0 0  Total GAD 7 Score 0 2 0  Anxiety Difficulty Not difficult at all Not difficult at all Not difficult at all      Review of Systems  Constitutional:  Negative for chills and fever.  Respiratory:  Negative for shortness of breath.   Cardiovascular:  Negative for chest pain.  Gastrointestinal:  Negative for abdominal pain, constipation, diarrhea, heartburn, nausea and vomiting.  Genitourinary:  Negative for dysuria, frequency and urgency.  Neurological:  Negative for dizziness and headaches.  Endo/Heme/Allergies:  Negative for polydipsia.  Psychiatric/Behavioral:  Negative for depression and suicidal ideas. The patient is not nervous/anxious.        Objective:     BP 124/82   Pulse 77   Temp 98.2 F (36.8 C) (Oral)   Ht 5' 1 (1.549 m)   Wt 204 lb 3.2 oz (92.6 kg)   SpO2 96%   BMI 38.58 kg/m  BP Readings from Last 3 Encounters:  08/01/24 124/82  04/14/24 130/68  03/24/24 132/82   Wt Readings from Last 3 Encounters:  08/01/24 204 lb 3.2 oz (92.6 kg)  06/27/24 200 lb (90.7 kg)  04/14/24 210 lb (95.3 kg)      Physical Exam Vitals and nursing note reviewed.  Constitutional:      Appearance: Normal appearance.  Cardiovascular:     Rate  and Rhythm: Normal rate and regular rhythm.     Pulses: Normal pulses.     Heart sounds: Normal heart sounds.  Pulmonary:     Effort: Pulmonary effort is normal.     Breath sounds: Normal breath sounds.  Neurological:     Mental Status: She is alert and oriented to person, place, and time.  Psychiatric:        Mood and Affect: Mood normal.        Behavior: Behavior normal.        Thought Content: Thought content normal.        Judgment: Judgment normal.      Results for orders placed or performed in visit on 08/01/24  POCT HgB A1C  Result Value Ref Range   Hemoglobin A1C 6.4 (A) 4.0 - 5.6 %   HbA1c POC (<> result, manual entry)     HbA1c, POC (prediabetic range)     HbA1c, POC (controlled diabetic range)         The ASCVD Risk score (Arnett DK, et al., 2019) failed to calculate for the following reasons:   Risk score cannot be calculated because patient has a medical history suggesting prior/existing ASCVD    Assessment & Plan:  Type 2 diabetes mellitus with hyperglycemia, unspecified whether long term insulin  use (HCC) Assessment & Plan: Hemoglobin A1c 6.4 today. Commended patient on progress.   Pneumonia vaccine UTD.  Eye exam UTD Foot exam UTD Urine ACR- due Statin therapy- atorvastatin .   Follow up in 3 months.   Orders: -     POCT glycosylated hemoglobin (Hb A1C) -     Microalbumin / creatinine urine ratio     Return in about 3 months (around  10/31/2024) for chronic care management.SABRA Carrol [place], NP

## 2024-08-01 NOTE — Patient Instructions (Addendum)
 Your recent lab tests have resulted, and my comments are displayed below. Please do not hesitate to reach out with any questions!  PLEASE COMPLETE THE COLOGUARD ASAP.   Schedule tdap and shingles vaccine.   Follow up in 3 months for chronic care management. Please come fasting for lab work.   It was a pleasure to see you today!

## 2024-08-02 LAB — MICROALBUMIN / CREATININE URINE RATIO
Creatinine, Urine: 155 mg/dL (ref 20–275)
Microalb Creat Ratio: 18 mg/g{creat} (ref <30)
Microalb, Ur: 2.8 mg/dL

## 2024-08-24 ENCOUNTER — Other Ambulatory Visit: Payer: Self-pay | Admitting: General Practice

## 2024-08-24 DIAGNOSIS — E1165 Type 2 diabetes mellitus with hyperglycemia: Secondary | ICD-10-CM

## 2024-09-15 ENCOUNTER — Other Ambulatory Visit

## 2024-10-05 ENCOUNTER — Other Ambulatory Visit: Payer: Self-pay | Admitting: Medical

## 2024-10-06 ENCOUNTER — Telehealth: Payer: Self-pay | Admitting: Medical

## 2024-10-06 NOTE — Telephone Encounter (Signed)
*  STAT* If patient is at the pharmacy, call can be transferred to refill team.   1. Which medications need to be refilled? (please list name of each medication and dose if known) losartan  25 mg 1tablet Wedin, carvedilol  12.5 mg 2 times daily   2. Would you like to learn more about the convenience, safety, & potential cost savings by using the [facility]?    3. Are you open to using the [facility] (Type [facility].   4. Which pharmacy/location (including street and city if local pharmacy) is medication to be sent to? [facility] [address] [facility]   5. Do they need a 30 day or 90 day supply? 90

## 2024-10-07 ENCOUNTER — Other Ambulatory Visit: Payer: Self-pay | Admitting: General Practice

## 2024-10-07 DIAGNOSIS — E1165 Type 2 diabetes mellitus with hyperglycemia: Secondary | ICD-10-CM

## 2024-10-07 MED ORDER — CARVEDILOL 12.5 MG PO TABS: 12.5000 mg | ORAL_TABLET | Freq: Two times a day (BID) | ORAL | 1 refills | Status: DC

## 2024-10-07 MED ORDER — LOSARTAN POTASSIUM 25 MG PO TABS: 25.0000 mg | ORAL_TABLET | Freq: Every day | ORAL | 1 refills | Status: DC

## 2024-10-07 NOTE — Telephone Encounter (Signed)
 Pt scheduled to see Cadence Franchester, NP, 11/28/24.  Refills sent.

## 2024-10-14 ENCOUNTER — Other Ambulatory Visit: Payer: Self-pay | Admitting: Internal Medicine

## 2024-10-31 ENCOUNTER — Ambulatory Visit: Admitting: General Practice

## 2024-11-07 ENCOUNTER — Other Ambulatory Visit: Payer: Self-pay | Admitting: General Practice

## 2024-11-07 DIAGNOSIS — E1165 Type 2 diabetes mellitus with hyperglycemia: Secondary | ICD-10-CM

## 2024-11-17 ENCOUNTER — Ambulatory Visit: Admitting: General Practice

## 2024-11-21 ENCOUNTER — Ambulatory Visit: Admitting: General Practice

## 2024-11-28 ENCOUNTER — Ambulatory Visit: Admitting: Medical

## 2024-12-09 ENCOUNTER — Other Ambulatory Visit: Payer: Self-pay | Admitting: Internal Medicine

## 2024-12-12 ENCOUNTER — Other Ambulatory Visit

## 2024-12-22 ENCOUNTER — Ambulatory Visit: Admitting: General Practice

## 2025-06-30 ENCOUNTER — Ambulatory Visit

## 2025-07-03 ENCOUNTER — Ambulatory Visit
